# Patient Record
Sex: Female | Born: 1955
Health system: Southern US, Community
[De-identification: ages and names within clinical notes are randomized; demographics above are authoritative.]

## PROBLEM LIST (undated history)

## (undated) DIAGNOSIS — M1990 Unspecified osteoarthritis, unspecified site: Secondary | ICD-10-CM

## (undated) DIAGNOSIS — K219 Gastro-esophageal reflux disease without esophagitis: Secondary | ICD-10-CM

## (undated) DIAGNOSIS — K639 Disease of intestine, unspecified: Secondary | ICD-10-CM

## (undated) DIAGNOSIS — T753XXA Motion sickness, initial encounter: Secondary | ICD-10-CM

## (undated) DIAGNOSIS — F419 Anxiety disorder, unspecified: Secondary | ICD-10-CM

## (undated) DIAGNOSIS — E785 Hyperlipidemia, unspecified: Secondary | ICD-10-CM

## (undated) DIAGNOSIS — Z1211 Encounter for screening for malignant neoplasm of colon: Secondary | ICD-10-CM

## (undated) DIAGNOSIS — F331 Major depressive disorder, recurrent, moderate: Secondary | ICD-10-CM

## (undated) HISTORY — DX: Encounter for screening for malignant neoplasm of colon: Z12.11

## (undated) HISTORY — DX: Disease of intestine, unspecified: K63.9

## (undated) HISTORY — DX: Gastro-esophageal reflux disease without esophagitis: K21.9

## (undated) HISTORY — DX: Unspecified osteoarthritis, unspecified site: M19.90

## (undated) HISTORY — PX: COSMETIC SURGERY: SHX468

## (undated) HISTORY — PX: PARTIAL HYSTERECTOMY: SHX80

## (undated) HISTORY — PX: EYE SURGERY: SHX253

---

## 2007-12-08 ENCOUNTER — Ambulatory Visit: Payer: Self-pay | Admitting: Pain Medicine

## 2007-12-24 ENCOUNTER — Ambulatory Visit: Payer: Self-pay | Admitting: Pain Medicine

## 2008-01-05 ENCOUNTER — Ambulatory Visit: Payer: Self-pay | Admitting: Orthopaedic Surgery

## 2008-01-29 ENCOUNTER — Ambulatory Visit: Payer: Self-pay | Admitting: Unknown Physician Specialty

## 2008-05-07 DIAGNOSIS — K639 Disease of intestine, unspecified: Secondary | ICD-10-CM

## 2008-05-07 HISTORY — DX: Disease of intestine, unspecified: K63.9

## 2010-03-08 IMAGING — CR DG SHOULDER 3+V*L*
1 series · 3 of 3 positions shown · non-contrast
Comparison: none

REASON FOR EXAM: NECK PAIN, RADICULITIS
COMMENTS:

PROCEDURE:     DXR - DXR SHOULDER LEFT COMPLETE  - December 08, 2007 [DATE]
RESULT:     No fracture, dislocation or other acute bony abnormality is
identified. No lytic or blastic lesions are seen.

[Series 1: view not recorded · 0.17mm/px · 3 of 3 slices shown]
[im 1/3]
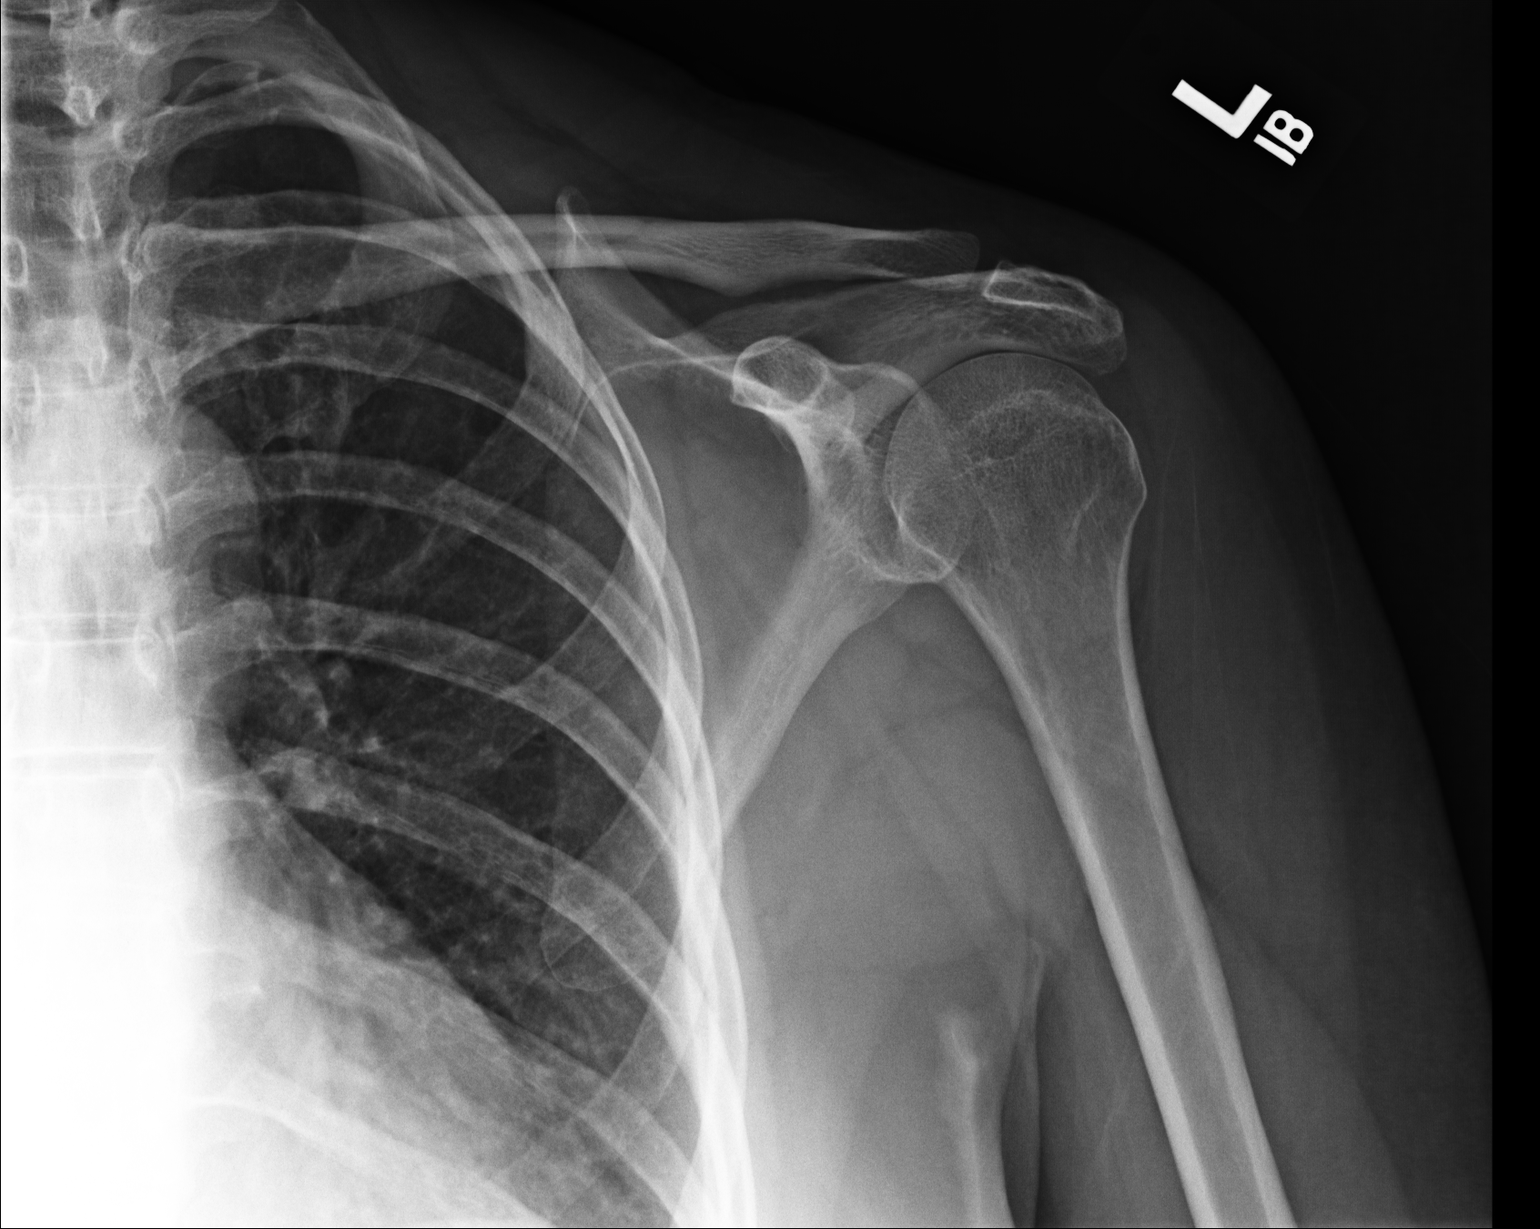
[im 2/3]
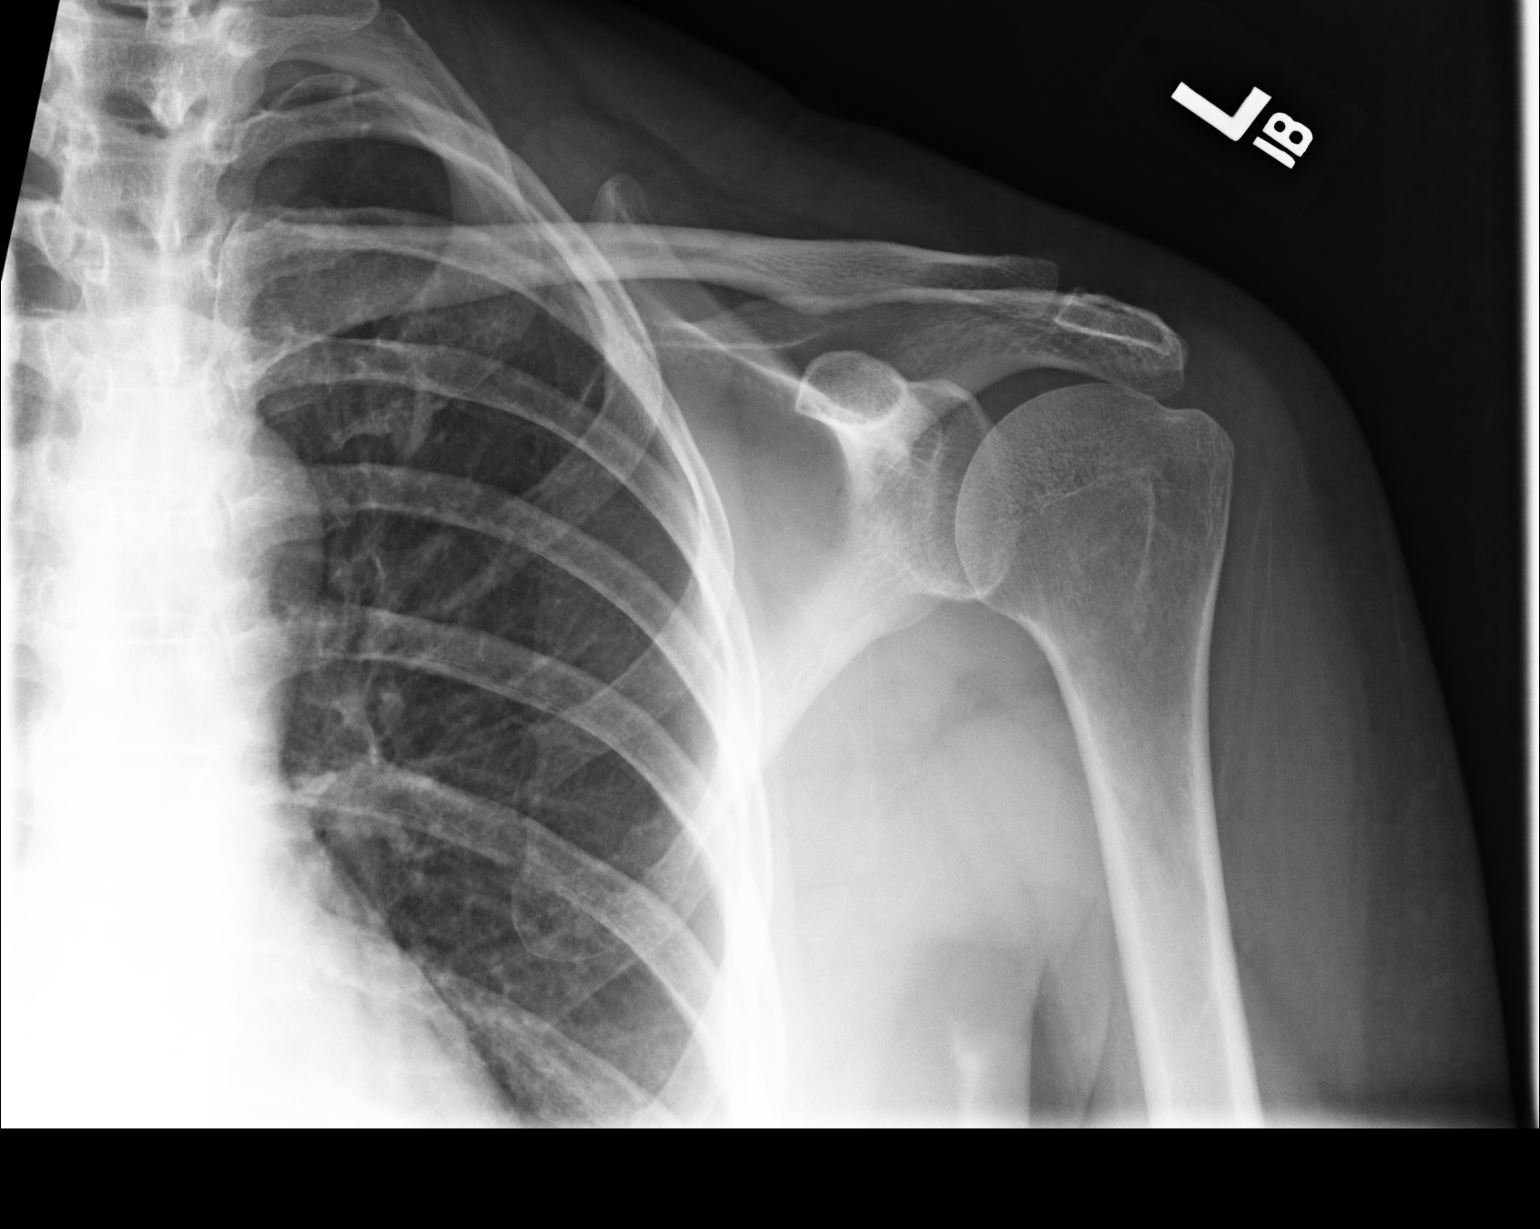
[im 3/3]
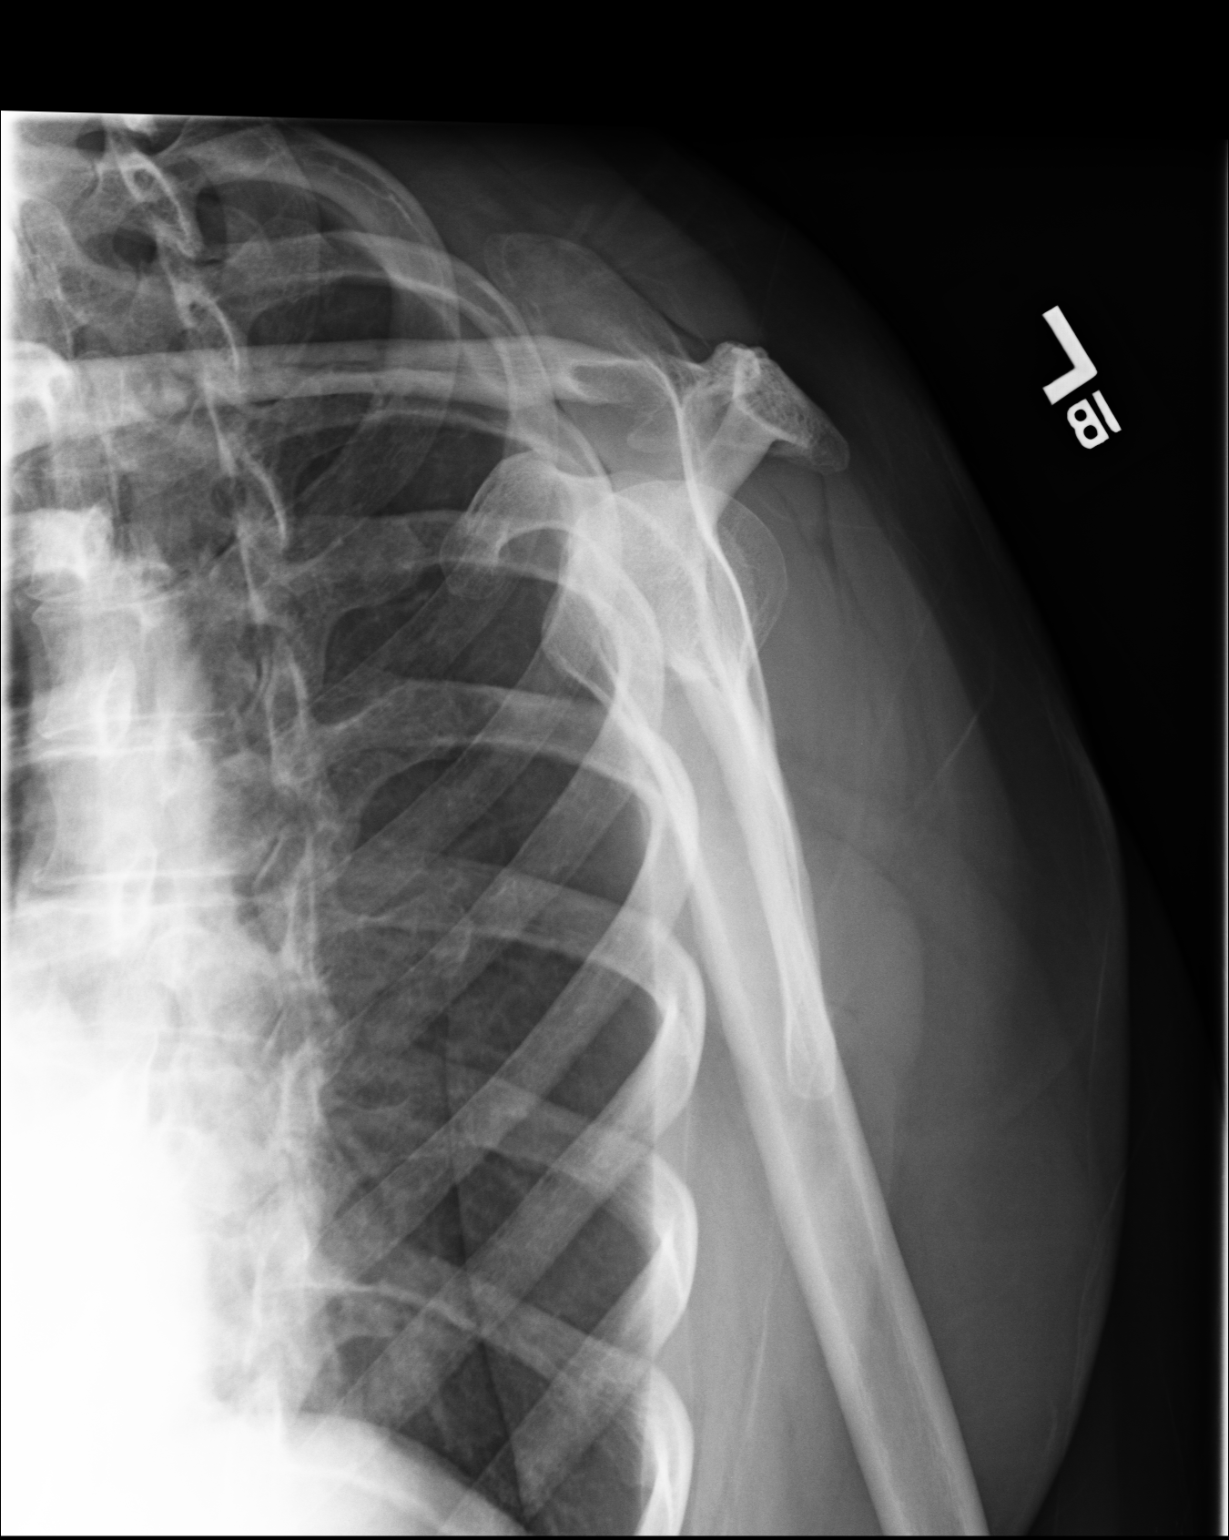

[3 of 3 positions shown; findings below may reference images not displayed]

IMPRESSION: No significant abnormalities are noted.

## 2010-03-08 IMAGING — CR CERVICAL SPINE - COMPLETE 4+ VIEW
1 series · 6 of 6 positions shown · non-contrast
Comparison: none

REASON FOR EXAM: NECK PAIN, RADICULITIS
COMMENTS:

[Series 1: view not recorded · 0.17mm/px · 6 of 6 slices shown]
[im 1/6]
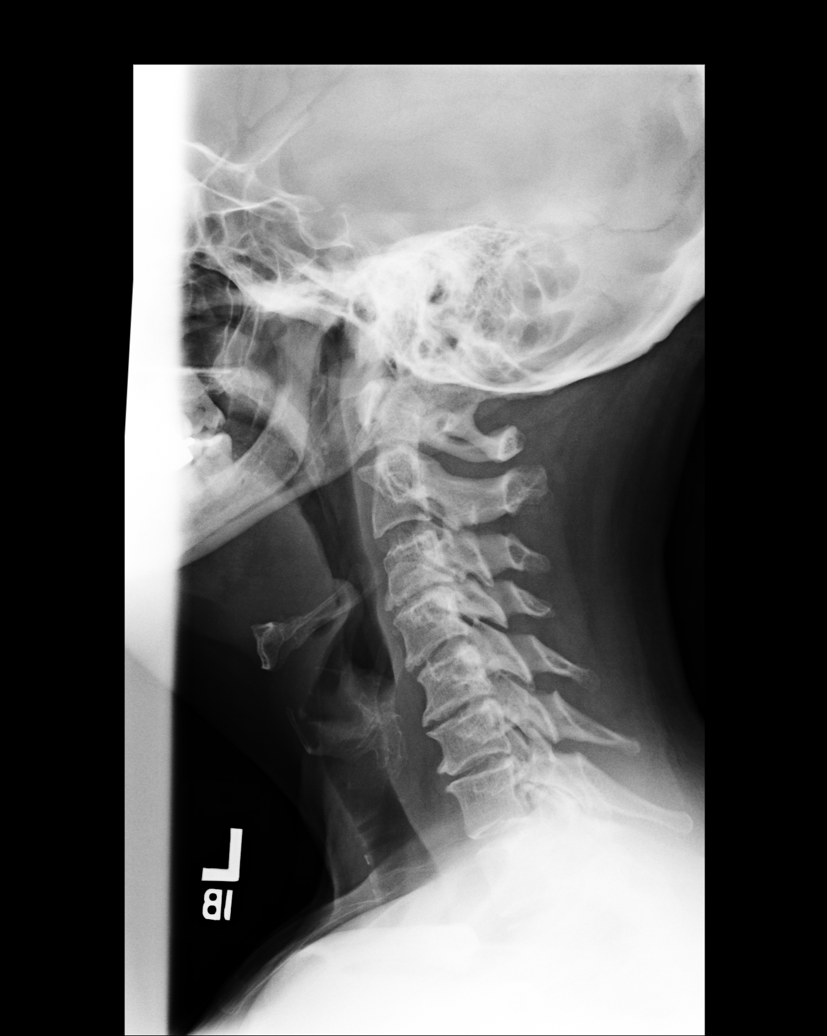
[im 2/6]
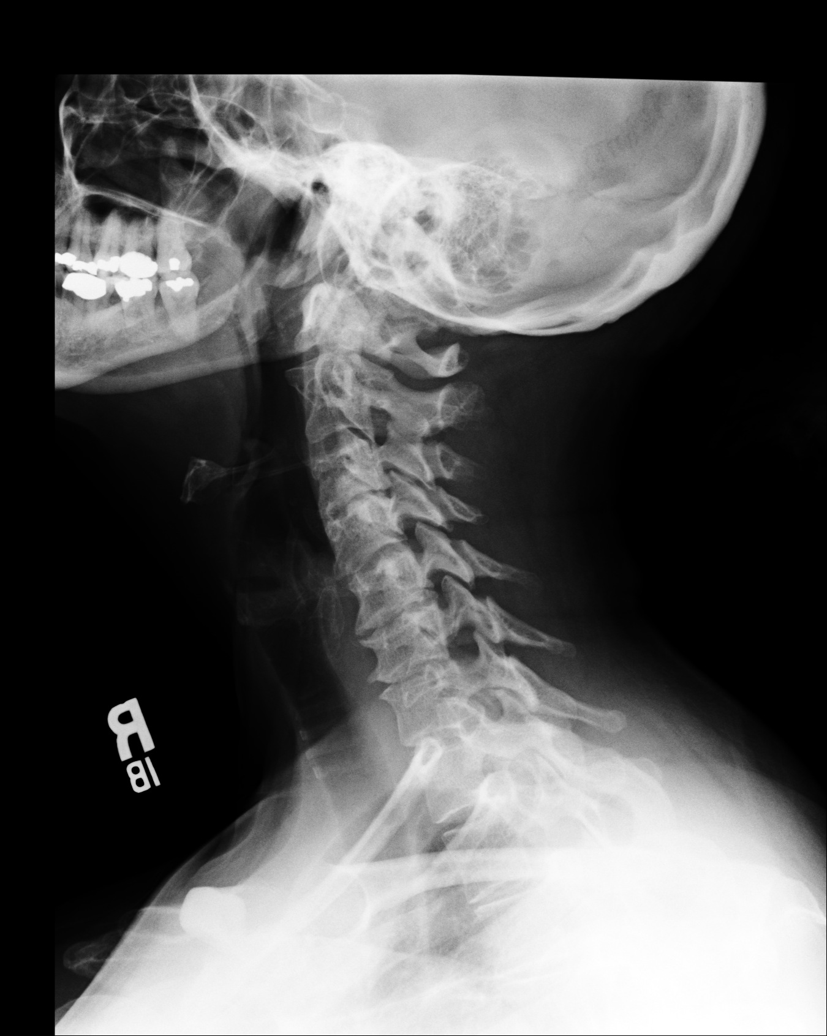
[im 3/6]
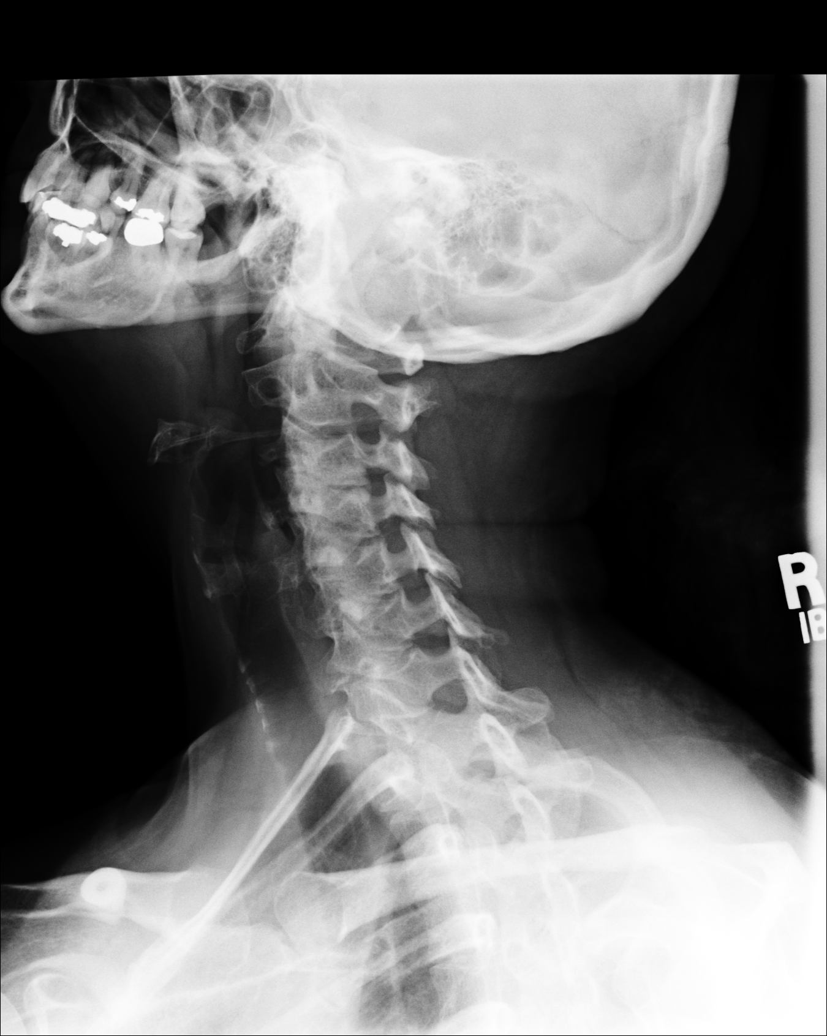
[im 4/6]
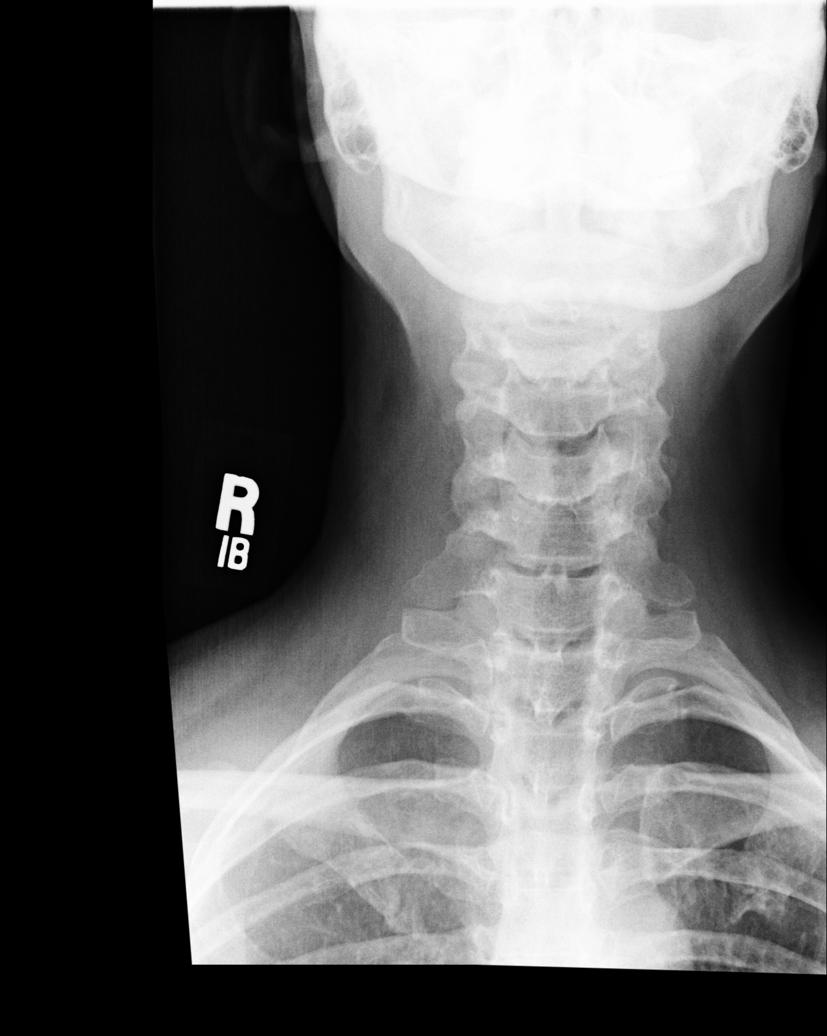
[im 5/6]
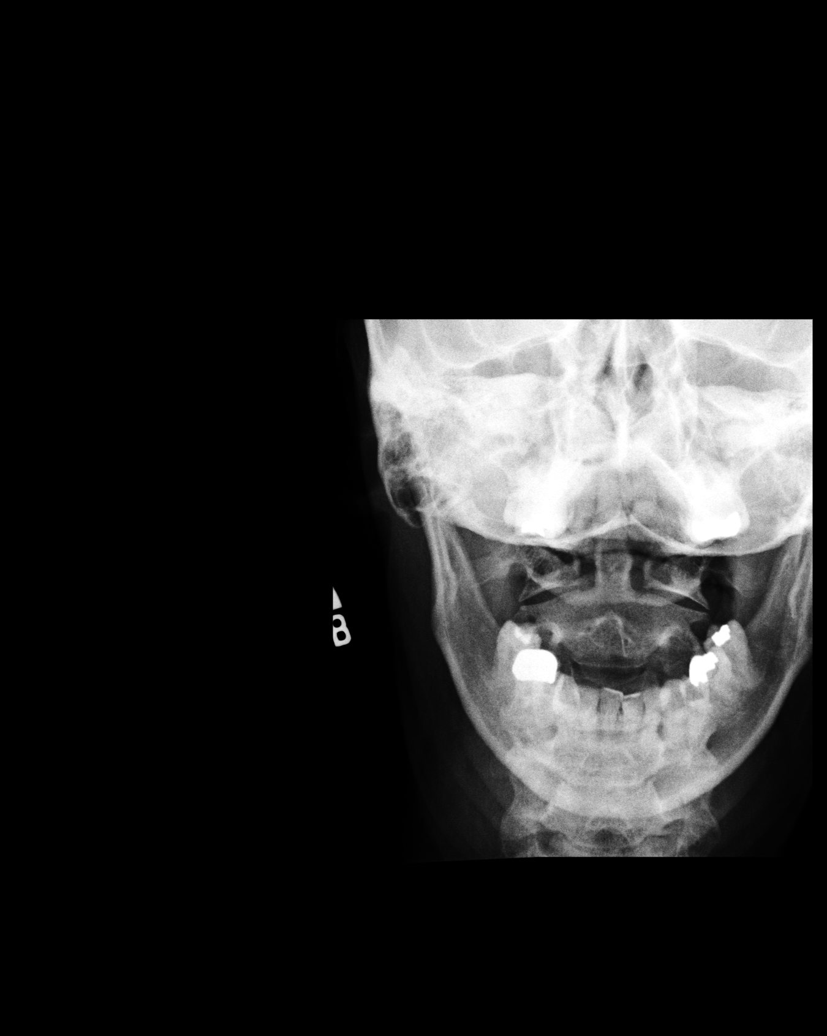
[im 6/6]
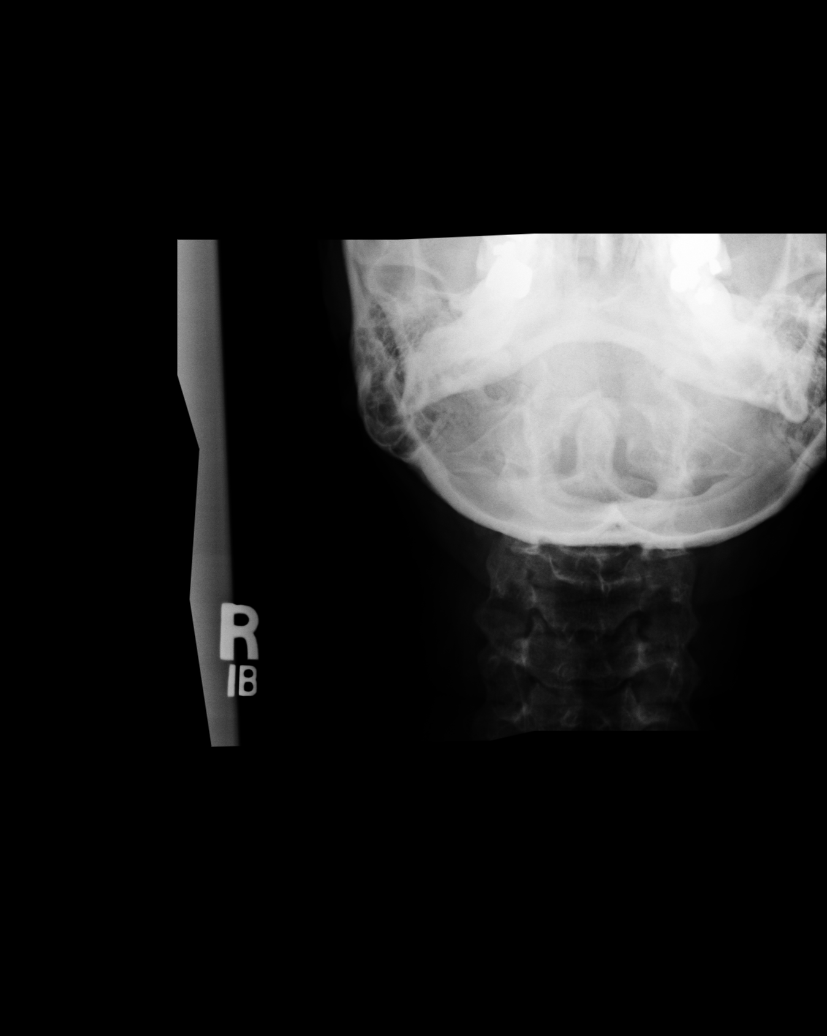

[6 of 6 positions shown; findings below may reference images not displayed]

PROCEDURE:     DXR - DXR CERVICAL SPINE COMPLETE  - December 08, 2007 [DATE]

RESULT:     The vertebral body heights are well-maintained. No fracture is
seen. There is narrowing of the C3-C4 and C5-C6 cervical disc spaces
compatible with cervical disc disease. There is slight spur impingement on
the neural foramina bilaterally at the C5-C6 level. Degenerative spurring is
noted anteriorly at multiple levels. The odontoid process is intact.
IMPRESSION: 1.     No fracture is seen.
2.     There are changes of cervical disc disease at C3-C4 and C5-C6 with
there being mild bilateral spur impingement on the neural foramina
bilaterally at the C5-C6 level.
3.     Hypertrophic spurring is present anteriorly at multiple levels.

## 2010-05-07 DIAGNOSIS — Z1211 Encounter for screening for malignant neoplasm of colon: Secondary | ICD-10-CM

## 2010-05-07 HISTORY — DX: Encounter for screening for malignant neoplasm of colon: Z12.11

## 2010-05-07 HISTORY — PX: COLONOSCOPY W/ POLYPECTOMY: SHX1380

## 2010-08-18 ENCOUNTER — Ambulatory Visit: Payer: Self-pay | Admitting: General Surgery

## 2010-08-21 LAB — PATHOLOGY REPORT

## 2011-08-31 ENCOUNTER — Ambulatory Visit: Payer: Self-pay | Admitting: Otolaryngology

## 2012-10-20 ENCOUNTER — Ambulatory Visit: Payer: Self-pay | Admitting: Family Medicine

## 2012-11-15 ENCOUNTER — Emergency Department: Payer: Self-pay | Admitting: Emergency Medicine

## 2012-11-15 LAB — CBC
HCT: 42.5 % (ref 35.0–47.0)
HGB: 14.7 g/dL (ref 12.0–16.0)
MCH: 28.5 pg (ref 26.0–34.0)
MCHC: 34.6 g/dL (ref 32.0–36.0)
MCV: 82 fL (ref 80–100)
Platelet: 182 10*3/uL (ref 150–440)
RBC: 5.16 10*6/uL (ref 3.80–5.20)
WBC: 4.7 10*3/uL (ref 3.6–11.0)

## 2012-11-15 LAB — URINALYSIS, COMPLETE
Bacteria: NONE SEEN
Blood: NEGATIVE
Glucose,UR: NEGATIVE mg/dL (ref 0–75)
Ketone: NEGATIVE
Ph: 9 (ref 4.5–8.0)
Specific Gravity: 1.004 (ref 1.003–1.030)
Squamous Epithelial: 2

## 2012-11-15 LAB — COMPREHENSIVE METABOLIC PANEL
Alkaline Phosphatase: 92 U/L (ref 50–136)
BUN: 10 mg/dL (ref 7–18)
Bilirubin,Total: 1.8 mg/dL — ABNORMAL HIGH (ref 0.2–1.0)
Creatinine: 1.02 mg/dL (ref 0.60–1.30)
EGFR (African American): >60
Glucose: 108 mg/dL — ABNORMAL HIGH (ref 65–99)
Osmolality: 285 (ref 275–301)
Potassium: 3.5 mmol/L (ref 3.5–5.1)
SGOT(AST): 16 U/L (ref 15–37)
SGPT (ALT): 18 U/L (ref 12–78)
Total Protein: 7.4 g/dL (ref 6.4–8.2)

## 2012-11-15 LAB — LIPASE, BLOOD: Lipase: 134 U/L (ref 73–393)

## 2012-11-19 ENCOUNTER — Encounter: Payer: Self-pay | Admitting: *Deleted

## 2013-05-07 HISTORY — PX: FACIAL COSMETIC SURGERY: SHX629

## 2014-01-25 ENCOUNTER — Ambulatory Visit: Payer: Self-pay | Admitting: Internal Medicine

## 2014-02-01 ENCOUNTER — Ambulatory Visit: Payer: Self-pay | Admitting: Internal Medicine

## 2014-10-24 ENCOUNTER — Other Ambulatory Visit: Payer: Self-pay | Admitting: Internal Medicine

## 2014-10-26 ENCOUNTER — Encounter: Payer: Self-pay | Admitting: Internal Medicine

## 2014-10-26 DIAGNOSIS — F331 Major depressive disorder, recurrent, moderate: Secondary | ICD-10-CM

## 2014-10-26 DIAGNOSIS — R5383 Other fatigue: Secondary | ICD-10-CM | POA: Insufficient documentation

## 2014-10-26 DIAGNOSIS — N6019 Diffuse cystic mastopathy of unspecified breast: Secondary | ICD-10-CM | POA: Insufficient documentation

## 2014-10-26 HISTORY — DX: Major depressive disorder, recurrent, moderate: F33.1

## 2015-01-14 ENCOUNTER — Other Ambulatory Visit: Payer: Self-pay | Admitting: Internal Medicine

## 2015-01-19 IMAGING — US ABDOMEN ULTRASOUND
1 series · 13 of 25 positions shown · non-contrast
Comparison: none

REASON FOR EXAM: Abd pain Bloating
COMMENTS:

[Series 1: abdomen ultrasound · 0.31mm/px · 13 of 109 slices shown]
[im 1/109]
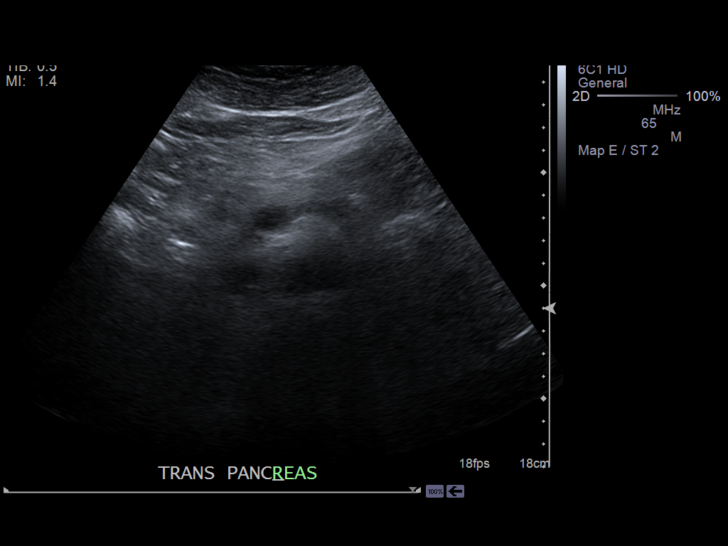
[im 10/109]
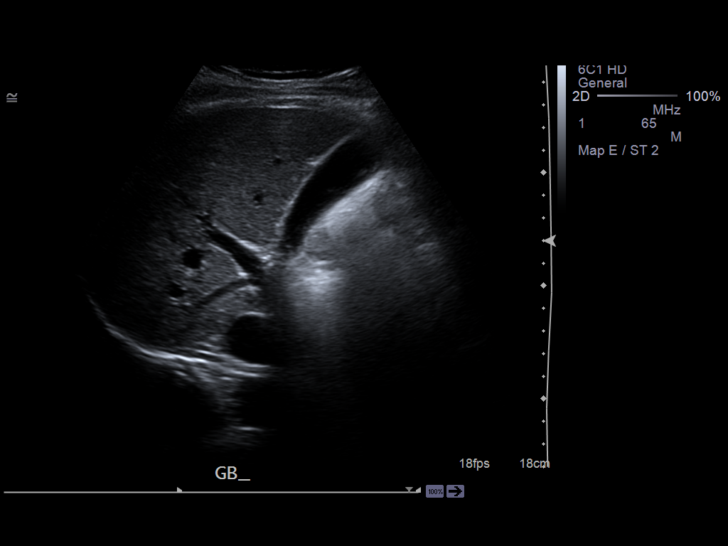
[im 19/109]
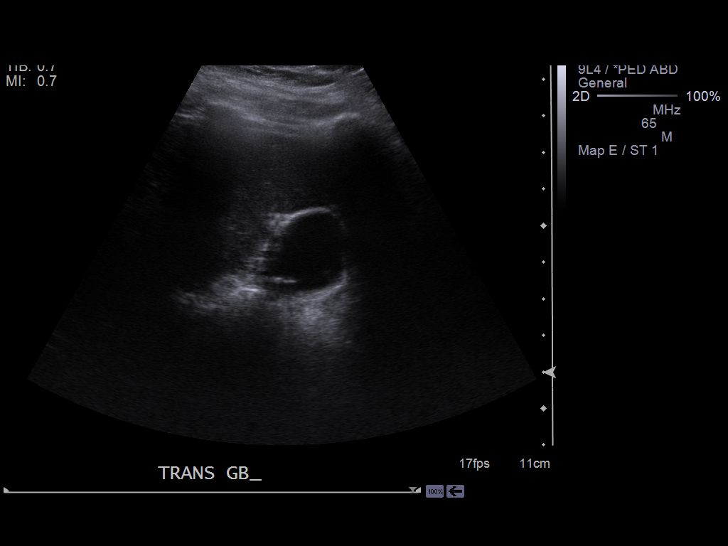
[im 28/109]
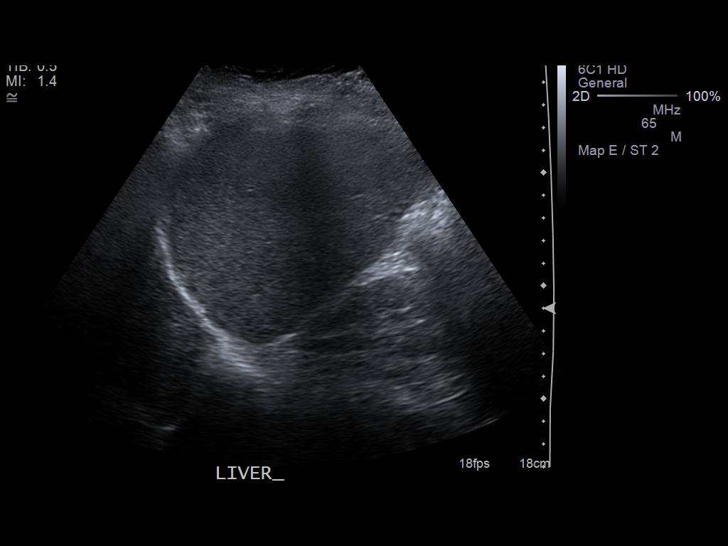
[im 37/109]
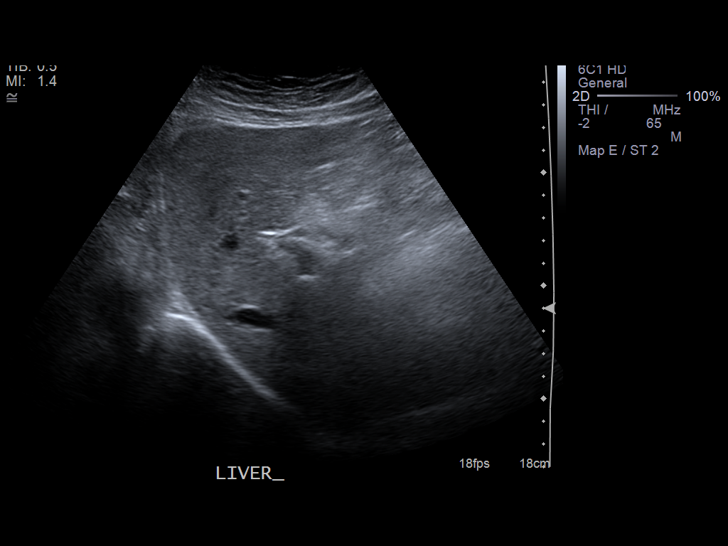
[im 46/109]
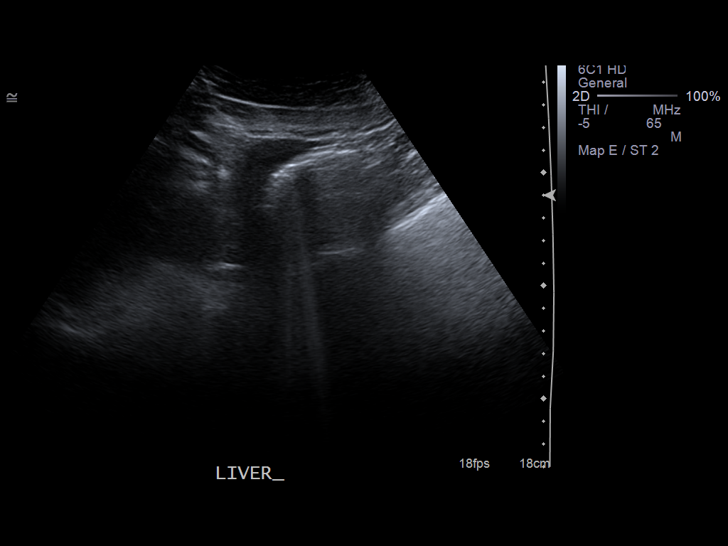
[im 55/109]
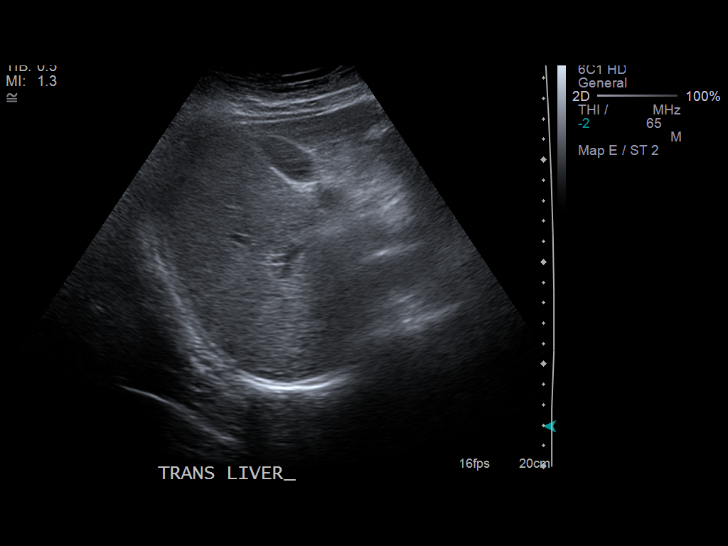
[im 64/109]
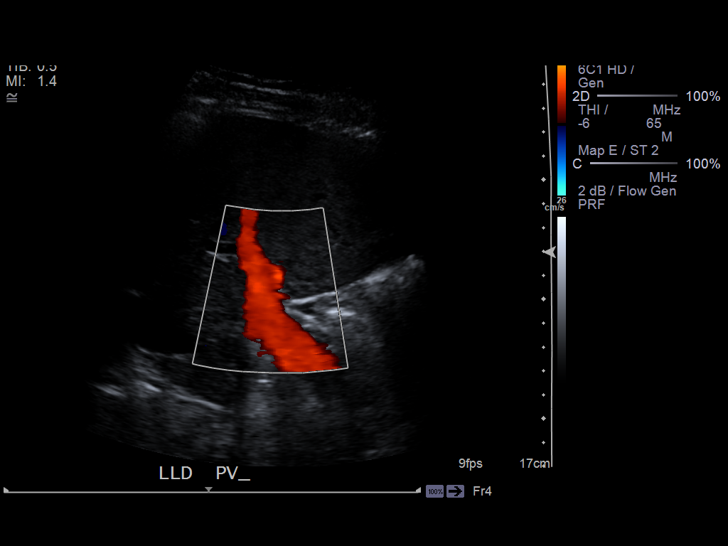
[im 73/109]
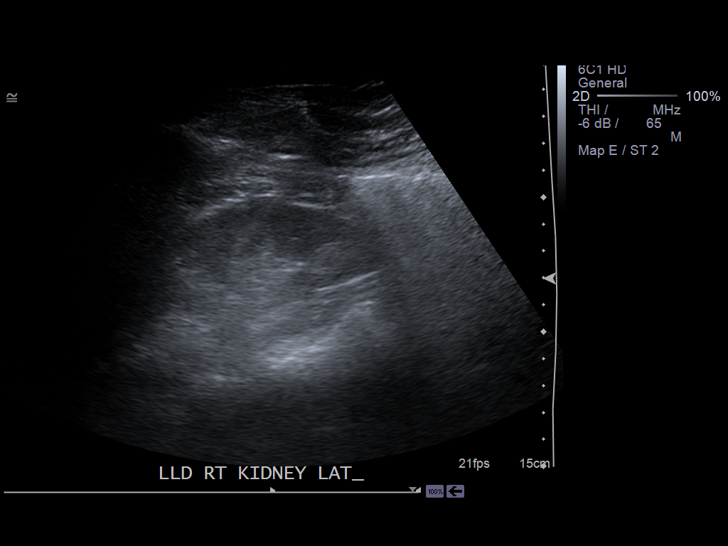
[im 82/109]
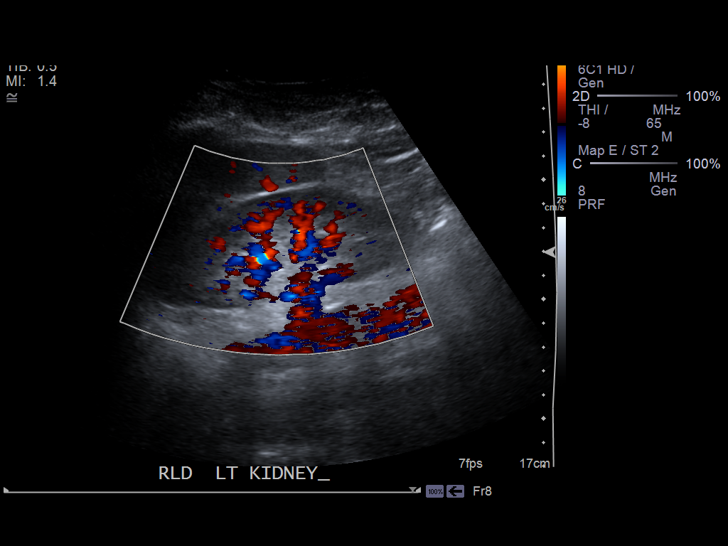
[im 91/109]
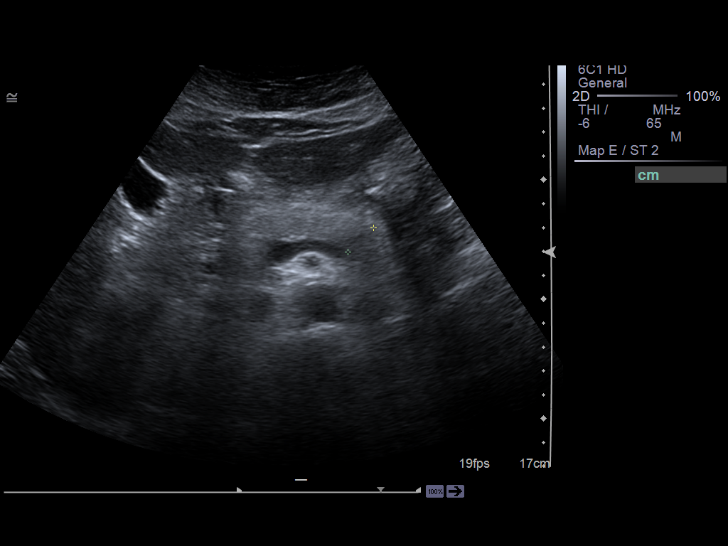
[im 100/109]
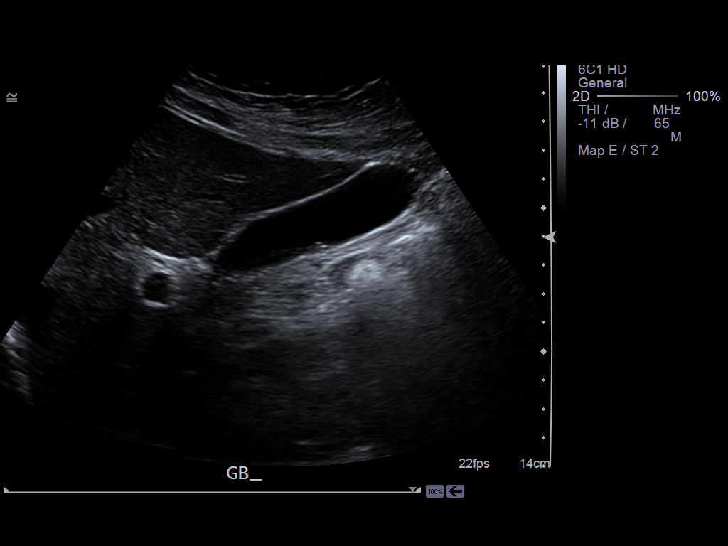
[im 109/109]
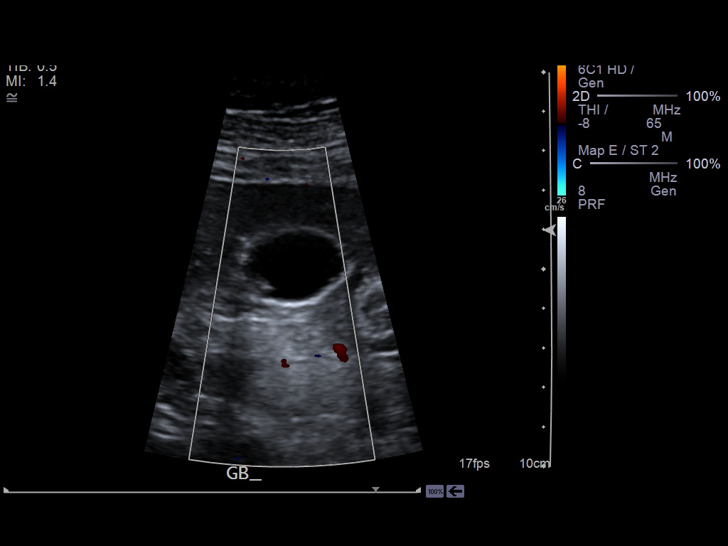

[13 of 25 positions shown; findings below may reference images not displayed]

PROCEDURE:     US  - US ABDOMEN GENERAL SURVEY  - October 20, 2012  [DATE]

RESULT:

The liver demonstrates a homogeneous echotexture. Hepatopetal flow is
identified within the portal vein. The aorta and IVC are unremarkable. The
pancreas and spleen are unremarkable. The spleen measures 11.16 cm in
longitudinal dimension. Evaluation of the gallbladder demonstrates small
echogenic foci. These foci appear to be nonmobile and differential
considerations are small adherent gallstones versus small gallbladder wall
polyps. There is no evidence of pericholecystic fluid, sonographic Murphy's
sign, gallbladder wall thickening or common bile duct dilatation. The
gallbladder wall measures 1.6 mm in thickness and the common bile duct
mm in diameter. There is no evidence of intrahepatic biliary ductal
dilatation.

The right kidney measures 9.87 x 4.97 x 4.38 cm and the left 10.7 x 4.22 x
4.18 cm. There is appropriate corticomedullary differentiation without
evidence of hydronephrosis or solid or cystic masses.
IMPRESSION: 1.  No sonographic evidence of cholecystitis.
2.  There are findings consistent with small, nonmobile gallstones versus
small gallbladder wall polyps. No further sonographic abnormality
identified. Re-evaluation of the gallbladder findings in 4 to 6 months is
recommended.

## 2015-02-06 ENCOUNTER — Encounter: Payer: Self-pay | Admitting: Internal Medicine

## 2015-02-06 DIAGNOSIS — E785 Hyperlipidemia, unspecified: Secondary | ICD-10-CM

## 2015-02-06 HISTORY — DX: Hyperlipidemia, unspecified: E78.5

## 2015-02-07 ENCOUNTER — Ambulatory Visit (INDEPENDENT_AMBULATORY_CARE_PROVIDER_SITE_OTHER): Payer: BLUE CROSS/BLUE SHIELD | Admitting: Internal Medicine

## 2015-02-07 ENCOUNTER — Encounter: Payer: Self-pay | Admitting: Internal Medicine

## 2015-02-07 VITALS — BP 136/70 | HR 64 | Ht 63.0 in | Wt 137.6 lb

## 2015-02-07 DIAGNOSIS — D126 Benign neoplasm of colon, unspecified: Secondary | ICD-10-CM | POA: Diagnosis not present

## 2015-02-07 DIAGNOSIS — Z1239 Encounter for other screening for malignant neoplasm of breast: Secondary | ICD-10-CM | POA: Diagnosis not present

## 2015-02-07 DIAGNOSIS — E785 Hyperlipidemia, unspecified: Secondary | ICD-10-CM | POA: Diagnosis not present

## 2015-02-07 DIAGNOSIS — F331 Major depressive disorder, recurrent, moderate: Secondary | ICD-10-CM | POA: Diagnosis not present

## 2015-02-07 DIAGNOSIS — Z0000 Encounter for general adult medical examination without abnormal findings: Secondary | ICD-10-CM | POA: Diagnosis not present

## 2015-02-07 MED ORDER — ESCITALOPRAM OXALATE 10 MG PO TABS: 10.0000 mg | ORAL_TABLET | Freq: Every day | ORAL | Status: DC

## 2015-02-07 NOTE — Progress Notes (Signed)
Date:  02/07/2015   Name:  Desiree Grant   DOB:  11/18/1955   MRN:  [id number]   Chief Complaint: Annual Exam Desiree Grant is a 59 y.o. female who presents today for her Complete Annual Exam. She feels fairly well. She reports exercising walking 5 miles three times per week. She reports she is sleeping fairly well. She has no breast issues. She is status post partial hysterectomy for bleeding and fibroids, her ovaries and cervix remain. Pap smear was done last year and was normal. Depression Patient is on Lexapro for depression. She takes 10 mg daily. She does note her symptoms worsen during periods of rainy weather or shorter days during the winter. She's never taken a higher dose than 10 mg. She does continue her usual activities and doesn't feel that her symptoms are significantly worse. Hyperlipidemia lipids done last year were borderline with a total cholesterol 219 and LDL of 146. HDL was 60. Her risk was low so no medications were recommended Review of Systems:  Review of Systems  Constitutional: Negative for fever, appetite change and fatigue.  HENT: Negative for hearing loss, sinus pressure, sore throat and trouble swallowing.   Eyes: Negative for visual disturbance.  Respiratory: Negative for chest tightness, shortness of breath and wheezing.   Cardiovascular: Positive for palpitations (occasional asymptomatic). Negative for chest pain and leg swelling.  Gastrointestinal: Negative for nausea, abdominal pain, diarrhea and constipation.  Genitourinary: Negative for dysuria, urgency, hematuria, vaginal bleeding and vaginal discharge.  Musculoskeletal: Negative for myalgias and gait problem.  Skin: Negative for color change and rash.  Psychiatric/Behavioral: Negative for sleep disturbance, dysphoric mood and decreased concentration.    Patient Active Problem List   Diagnosis Date Noted  . Hyperlipidemia, mild 02/06/2015  . Fatigue 10/26/2014  . Fibrocystic breast changes 10/26/2014   . Depression, major, recurrent, moderate ([place]) 10/26/2014    Prior to Admission medications   Medication Sig Start Date End Date Taking? Authorizing Provider  escitalopram (LEXAPRO) 10 MG tablet TAKE 1 TABLET BY MOUTH EVERY DAY 01/14/15  Yes Glean Hess, MD    Allergies  Allergen Reactions  . Niacin And Related Other (See Comments)    blisters    Past Surgical History  Procedure Laterality Date  . Colonoscopy w/ polypectomy  2012    tubular adenoma 0.5cm was removed from descending colon  . Partial hysterectomy      bleeding; cervix remains    Social History  Substance Use Topics  . Smoking status: Never Smoker   . Smokeless tobacco: None  . Alcohol Use: No     Medication list has been reviewed and updated.  Physical Examination:  Physical Exam  Constitutional: She is oriented to person, place, and time. She appears well-developed and well-nourished. No distress.  HENT:  Head: Normocephalic and atraumatic.  Eyes: Conjunctivae are normal. Right eye exhibits no discharge. Left eye exhibits no discharge. No scleral icterus.  Neck: Normal range of motion. Carotid bruit is not present. No thyromegaly present.  Cardiovascular: Normal rate, regular rhythm and normal heart sounds.   Pulmonary/Chest: Effort normal and breath sounds normal. No respiratory distress. Right breast exhibits no mass, no nipple discharge, no skin change and no tenderness. Left breast exhibits no mass, no nipple discharge, no skin change and no tenderness.  Abdominal: Soft. Bowel sounds are normal. There is no tenderness. There is no rebound and no guarding.  Genitourinary: Vagina normal. Pelvic exam was performed with patient prone. Cervix exhibits no motion tenderness.  Right adnexum displays no mass and no tenderness. Left adnexum displays no mass and no tenderness.  Musculoskeletal: Normal range of motion. She exhibits no edema.  Lymphadenopathy:    She has no cervical adenopathy.    She has no  axillary adenopathy.  Neurological: She is alert and oriented to person, place, and time. She has normal reflexes.  Skin: Skin is warm, dry and intact. No rash noted.  Psychiatric: She has a normal mood and affect. Her speech is normal and behavior is normal. Thought content normal. Cognition and memory are normal.  Nursing note and vitals reviewed.   BP 136/70 mmHg  Pulse 64  Ht 5\' 3"  (1.6 m)  Wt 137 lb 9.6 oz (62.415 kg)  BMI 24.38 kg/m2  Assessment and Plan: 1. Annual physical exam Normal exam with normal pelvic exam Recommend Pap smear every 3 years - Flu Vaccine QUAD 36+ mos PF IM (Fluarix & Fluzone Quad PF) - CBC with Differential/Platelet - Comprehensive metabolic panel - POCT urinalysis dipstick  2. Breast cancer screening - MM DIGITAL SCREENING BILATERAL; Future  3. Depression, major, recurrent, moderate ([place]) Doing fairly well on current dose Consider increasing to 20 mg daily as needed seasonally - escitalopram (LEXAPRO) 10 MG tablet; Take 1 tablet (10 mg total) by mouth daily.  Dispense: 30 tablet; Refill: 12 - TSH  4. Hyperlipidemia, mild Check lipid panel and advise on medication - Lipid panel  5. Adenomatous colon polyp Found in 2012 Suspect next colonoscopy due 2017  Halina Maidens, MD [facility]  02/07/2015

## 2015-02-07 NOTE — Patient Instructions (Signed)

## 2015-02-08 LAB — COMPREHENSIVE METABOLIC PANEL
A/G RATIO: 2.3 (ref 1.1–2.5)
ALBUMIN: 4.4 g/dL (ref 3.5–5.5)
ALT: 12 IU/L (ref 0–32)
AST: 13 IU/L (ref 0–40)
Alkaline Phosphatase: 50 IU/L (ref 39–117)
BUN / CREAT RATIO: 25 — AB (ref 9–23)
BUN: 17 mg/dL (ref 6–24)
Bilirubin Total: 0.7 mg/dL (ref 0.0–1.2)
CALCIUM: 9.6 mg/dL (ref 8.7–10.2)
CO2: 27 mmol/L (ref 18–29)
Chloride: 103 mmol/L (ref 97–108)
Creatinine, Ser: 0.69 mg/dL (ref 0.57–1.00)
GFR, EST AFRICAN AMERICAN: 112 mL/min/{1.73_m2} (ref >59)
GFR, EST NON AFRICAN AMERICAN: 97 mL/min/{1.73_m2} (ref >59)
GLOBULIN, TOTAL: 1.9 g/dL (ref 1.5–4.5)
Glucose: 84 mg/dL (ref 65–99)
POTASSIUM: 4.3 mmol/L (ref 3.5–5.2)
SODIUM: 144 mmol/L (ref 134–144)
TOTAL PROTEIN: 6.3 g/dL (ref 6.0–8.5)

## 2015-02-08 LAB — LIPID PANEL
CHOL/HDL RATIO: 2.7 ratio (ref 0.0–4.4)
Cholesterol, Total: 207 mg/dL — ABNORMAL HIGH (ref 100–199)
HDL: 77 mg/dL (ref >39)
LDL Calculated: 122 mg/dL — ABNORMAL HIGH (ref 0–99)
Triglycerides: 40 mg/dL (ref 0–149)
VLDL Cholesterol Cal: 8 mg/dL (ref 5–40)

## 2015-02-08 LAB — CBC WITH DIFFERENTIAL/PLATELET
BASOS: 1 %
Basophils Absolute: 0 10*3/uL (ref 0.0–0.2)
EOS (ABSOLUTE): 0.1 10*3/uL (ref 0.0–0.4)
EOS: 2 %
HEMATOCRIT: 42.8 % (ref 34.0–46.6)
HEMOGLOBIN: 14 g/dL (ref 11.1–15.9)
IMMATURE GRANS (ABS): 0 10*3/uL (ref 0.0–0.1)
IMMATURE GRANULOCYTES: 0 %
LYMPHS: 34 %
Lymphocytes Absolute: 1.6 10*3/uL (ref 0.7–3.1)
MCH: 28.2 pg (ref 26.6–33.0)
MCHC: 32.7 g/dL (ref 31.5–35.7)
MCV: 86 fL (ref 79–97)
Monocytes Absolute: 0.3 10*3/uL (ref 0.1–0.9)
Monocytes: 6 %
NEUTROS ABS: 2.6 10*3/uL (ref 1.4–7.0)
NEUTROS PCT: 57 %
Platelets: 170 10*3/uL (ref 150–379)
RBC: 4.97 x10E6/uL (ref 3.77–5.28)
RDW: 13.3 % (ref 12.3–15.4)
WBC: 4.6 10*3/uL (ref 3.4–10.8)

## 2015-02-08 LAB — TSH: TSH: 0.949 u[IU]/mL (ref 0.450–4.500)

## 2015-02-22 ENCOUNTER — Other Ambulatory Visit: Payer: Self-pay | Admitting: Internal Medicine

## 2015-02-22 DIAGNOSIS — F331 Major depressive disorder, recurrent, moderate: Secondary | ICD-10-CM

## 2015-02-22 MED ORDER — ESCITALOPRAM OXALATE 20 MG PO TABS: 20.0000 mg | ORAL_TABLET | Freq: Every day | ORAL | Status: DC

## 2015-03-11 ENCOUNTER — Telehealth: Payer: Self-pay

## 2015-03-11 NOTE — Telephone Encounter (Signed)
Patient wanted to discuss Anxiety meds and asked to be changed to new Rx stating Lexapro was not working. Per Dr. Army Melia I offered her an appointment and she refused. I did tell her that until being seen she would have to stay on Lexapro and she agreed she can not be seen and stated she will find someone who can and hung up.

## 2015-03-30 ENCOUNTER — Ambulatory Visit (INDEPENDENT_AMBULATORY_CARE_PROVIDER_SITE_OTHER): Payer: BLUE CROSS/BLUE SHIELD | Admitting: Internal Medicine

## 2015-03-30 ENCOUNTER — Encounter: Payer: Self-pay | Admitting: Internal Medicine

## 2015-03-30 VITALS — BP 120/60 | HR 80 | Ht 63.0 in | Wt 133.8 lb

## 2015-03-30 DIAGNOSIS — F419 Anxiety disorder, unspecified: Secondary | ICD-10-CM | POA: Diagnosis not present

## 2015-03-30 DIAGNOSIS — F331 Major depressive disorder, recurrent, moderate: Secondary | ICD-10-CM

## 2015-03-30 MED ORDER — ESCITALOPRAM OXALATE 10 MG PO TABS: 10.0000 mg | ORAL_TABLET | Freq: Every day | ORAL | Status: DC

## 2015-03-30 MED ORDER — BUPROPION HCL ER (XL) 150 MG PO TB24: 150.0000 mg | ORAL_TABLET | Freq: Every day | ORAL | Status: DC

## 2015-03-30 NOTE — Progress Notes (Signed)
Date:  03/30/2015   Name:  Desiree Grant   DOB:  1955-12-01   MRN:  [id number]   Chief Complaint: Anxiety  Patient complains of increased depressive symptoms along with anxiety and irritability. 6 weeks ago she was having some increase in symptoms have increased her Lexapro to 20 mg. Now she thinks that her symptoms may be slightly worse, possibly related to the increased dose. Most of her stress is related to work, employees and family. She's not sleeping well but is not feeling suicidal. She feels like she needs additional or different medication.  Review of Systems  Constitutional: Positive for fatigue. Negative for fever, chills and unexpected weight change.  Respiratory: Negative for chest tightness and shortness of breath.   Cardiovascular: Negative for chest pain and palpitations.  Psychiatric/Behavioral: Positive for sleep disturbance, dysphoric mood and agitation. Negative for behavioral problems and self-injury. The patient is nervous/anxious.     Patient Active Problem List   Diagnosis Date Noted  . Adenomatous colon polyp 02/07/2015  . Hyperlipidemia, mild 02/06/2015  . Fatigue 10/26/2014  . Fibrocystic breast changes 10/26/2014  . Depression, major, recurrent, moderate ([place]) 10/26/2014    Prior to Admission medications   Medication Sig Start Date End Date Taking? Authorizing Provider  escitalopram (LEXAPRO) 20 MG tablet Take 1 tablet (20 mg total) by mouth daily. 02/22/15  Yes Glean Hess, MD    Allergies  Allergen Reactions  . Niacin And Related Other (See Comments)    blisters    Past Surgical History  Procedure Laterality Date  . Colonoscopy w/ polypectomy  2012    tubular adenoma 0.5cm was removed from descending colon  . Partial hysterectomy      bleeding; cervix remains    Social History  Substance Use Topics  . Smoking status: Never Smoker   . Smokeless tobacco: None  . Alcohol Use: No    Medication list has been reviewed and  updated.   Physical Exam  Constitutional: She is oriented to person, place, and time. She appears well-developed.  Neck: Normal range of motion. Neck supple. No thyromegaly present.  Cardiovascular: Normal rate, regular rhythm and normal heart sounds.   Pulmonary/Chest: Effort normal and breath sounds normal.  Musculoskeletal: She exhibits no edema.  Neurological: She is alert and oriented to person, place, and time.  Psychiatric: Her speech is normal. Her mood appears anxious. Cognition and memory are normal. She exhibits a depressed mood.    BP 120/60 mmHg  Pulse 80  Ht 5\' 3"  (1.6 m)  Wt 133 lb 12.8 oz (60.691 kg)  BMI 23.71 kg/m2  Assessment and Plan: 1. Depression, major, recurrent, moderate (HCC) Reduce Lexapro to 10 mg And Wellbutrin 150 mg Follow-up if symptoms are not improving after 4 weeks - buPROPion (WELLBUTRIN XL) 150 MG 24 hr tablet; Take 1 tablet (150 mg total) by mouth daily.  Dispense: 30 tablet; Refill: 3 - escitalopram (LEXAPRO) 10 MG tablet; Take 1 tablet (10 mg total) by mouth daily.  Dispense: 30 tablet; Refill: 5  2. Acute anxiety Should improve with addition of bupropion May need to consider short acting prn medication   Halina Maidens, MD [facility]  03/30/2015

## 2015-08-08 ENCOUNTER — Ambulatory Visit: Payer: Self-pay | Admitting: General Surgery

## 2015-11-14 ENCOUNTER — Other Ambulatory Visit: Payer: Self-pay | Admitting: Internal Medicine

## 2015-11-14 ENCOUNTER — Telehealth: Payer: Self-pay

## 2015-11-14 DIAGNOSIS — F331 Major depressive disorder, recurrent, moderate: Secondary | ICD-10-CM

## 2015-11-14 MED ORDER — ESCITALOPRAM OXALATE 10 MG PO TABS: 10.0000 mg | ORAL_TABLET | Freq: Every day | ORAL | Status: DC

## 2015-11-14 MED ORDER — BUPROPION HCL ER (XL) 150 MG PO TB24: 150.0000 mg | ORAL_TABLET | Freq: Every day | ORAL | Status: DC

## 2015-11-14 NOTE — Telephone Encounter (Signed)
Patient wants refills on meds.

## 2015-12-02 ENCOUNTER — Other Ambulatory Visit: Payer: Self-pay | Admitting: Internal Medicine

## 2015-12-02 DIAGNOSIS — F331 Major depressive disorder, recurrent, moderate: Secondary | ICD-10-CM

## 2016-02-08 ENCOUNTER — Ambulatory Visit (INDEPENDENT_AMBULATORY_CARE_PROVIDER_SITE_OTHER): Payer: BLUE CROSS/BLUE SHIELD | Admitting: Internal Medicine

## 2016-02-08 ENCOUNTER — Ambulatory Visit
Admission: RE | Admit: 2016-02-08 | Discharge: 2016-02-08 | Disposition: A | Discharge status: Home / Self Care | Payer: BLUE CROSS/BLUE SHIELD | Source: Ambulatory Visit | Attending: Internal Medicine | Admitting: Internal Medicine

## 2016-02-08 ENCOUNTER — Encounter: Payer: Self-pay | Admitting: Internal Medicine

## 2016-02-08 VITALS — BP 120/80 | HR 74 | Resp 16 | Ht 63.0 in | Wt 140.0 lb

## 2016-02-08 DIAGNOSIS — F331 Major depressive disorder, recurrent, moderate: Secondary | ICD-10-CM

## 2016-02-08 DIAGNOSIS — M25551 Pain in right hip: Secondary | ICD-10-CM | POA: Insufficient documentation

## 2016-02-08 DIAGNOSIS — E785 Hyperlipidemia, unspecified: Secondary | ICD-10-CM | POA: Diagnosis not present

## 2016-02-08 DIAGNOSIS — Z1159 Encounter for screening for other viral diseases: Secondary | ICD-10-CM | POA: Diagnosis not present

## 2016-02-08 DIAGNOSIS — Z1231 Encounter for screening mammogram for malignant neoplasm of breast: Secondary | ICD-10-CM

## 2016-02-08 DIAGNOSIS — Z0000 Encounter for general adult medical examination without abnormal findings: Secondary | ICD-10-CM

## 2016-02-08 DIAGNOSIS — M5442 Lumbago with sciatica, left side: Principal | ICD-10-CM

## 2016-02-08 DIAGNOSIS — G8929 Other chronic pain: Secondary | ICD-10-CM | POA: Diagnosis not present

## 2016-02-08 DIAGNOSIS — Z23 Encounter for immunization: Secondary | ICD-10-CM | POA: Diagnosis not present

## 2016-02-08 DIAGNOSIS — M25552 Pain in left hip: Secondary | ICD-10-CM | POA: Insufficient documentation

## 2016-02-08 DIAGNOSIS — Z1239 Encounter for other screening for malignant neoplasm of breast: Secondary | ICD-10-CM

## 2016-02-08 LAB — POCT URINALYSIS DIPSTICK
Bilirubin, UA: NEGATIVE
GLUCOSE UA: NEGATIVE
Ketones, UA: NEGATIVE
Leukocytes, UA: NEGATIVE
NITRITE UA: NEGATIVE
PROTEIN UA: NEGATIVE
RBC UA: NEGATIVE
SPEC GRAV UA: 1.01
UROBILINOGEN UA: 0.2
pH, UA: 6

## 2016-02-08 MED ORDER — ESCITALOPRAM OXALATE 20 MG PO TABS: 20.0000 mg | ORAL_TABLET | Freq: Every day | ORAL | 5 refills | Status: DC

## 2016-02-08 MED ORDER — METHYLPREDNISOLONE 4 MG PO TBPK: ORAL_TABLET | ORAL | 0 refills | Status: DC

## 2016-02-08 MED ORDER — CYCLOBENZAPRINE HCL 10 MG PO TABS: 10.0000 mg | ORAL_TABLET | Freq: Every day | ORAL | 2 refills | Status: DC

## 2016-02-08 NOTE — Patient Instructions (Signed)
**After Prednisone taper is complete, begin Aleve 220 mg 2 tablets twice a day with food   Breast Self-Awareness Practicing breast self-awareness may pick up problems early, prevent significant medical complications, and possibly save your life. By practicing breast self-awareness, you can become familiar with how your breasts look and feel and if your breasts are changing. This allows you to notice changes early. It can also offer you some reassurance that your breast health is good. One way to learn what is normal for your breasts and whether your breasts are changing is to do a breast self-exam. If you find a lump or something that was not present in the past, it is best to contact your caregiver right away. Other findings that should be evaluated by your caregiver include nipple discharge, especially if it is bloody; skin changes or reddening; areas where the skin seems to be pulled in (retracted); or new lumps and bumps. Breast pain is seldom associated with cancer (malignancy), but should also be evaluated by a caregiver. HOW TO PERFORM A BREAST SELF-EXAM The best time to examine your breasts is 5-7 days after your menstrual period is over. During menstruation, the breasts are lumpier, and it may be more difficult to pick up changes. If you do not menstruate, have reached menopause, or had your uterus removed (hysterectomy), you should examine your breasts at regular intervals, such as monthly. If you are breastfeeding, examine your breasts after a feeding or after using a breast pump. Breast implants do not decrease the risk for lumps or tumors, so continue to perform breast self-exams as recommended. Talk to your caregiver about how to determine the difference between the implant and breast tissue. Also, talk about the amount of pressure you should use during the exam. Over time, you will become more familiar with the variations of your breasts and more comfortable with the exam. A breast self-exam  requires you to remove all your clothes above the waist. 1. Look at your breasts and nipples. Stand in front of a mirror in a room with good lighting. With your hands on your hips, push your hands firmly downward. Look for a difference in shape, contour, and size from one breast to the other (asymmetry). Asymmetry includes puckers, dips, or bumps. Also, look for skin changes, such as reddened or scaly areas on the breasts. Look for nipple changes, such as discharge, dimpling, repositioning, or redness. 2. Carefully feel your breasts. This is best done either in the shower or tub while using soapy water or when flat on your back. Place the arm (on the side of the breast you are examining) above your head. Use the pads (not the fingertips) of your three middle fingers on your opposite hand to feel your breasts. Start in the underarm area and use  inch (2 cm) overlapping circles to feel your breast. Use 3 different levels of pressure (light, medium, and firm pressure) at each circle before moving to the next circle. The light pressure is needed to feel the tissue closest to the skin. The medium pressure will help to feel breast tissue a little deeper, while the firm pressure is needed to feel the tissue close to the ribs. Continue the overlapping circles, moving downward over the breast until you feel your ribs below your breast. Then, move one finger-width towards the center of the body. Continue to use the  inch (2 cm) overlapping circles to feel your breast as you move slowly up toward the collar bone (clavicle) near the  base of the neck. Continue the up and down exam using all 3 pressures until you reach the middle of the chest. Do this with each breast, carefully feeling for lumps or changes. 3.  Keep a written record with breast changes or normal findings for each breast. By writing this information down, you do not need to depend only on memory for size, tenderness, or location. Write down where you are in  your menstrual cycle, if you are still menstruating. Breast tissue can have some lumps or thick tissue. However, see your caregiver if you find anything that concerns you.  SEEK MEDICAL CARE IF:  You see a change in shape, contour, or size of your breasts or nipples.   You see skin changes, such as reddened or scaly areas on the breasts or nipples.   You have an unusual discharge from your nipples.   You feel a new lump or unusually thick areas.    This information is not intended to replace advice given to you by your health care provider. Make sure you discuss any questions you have with your health care provider.   Document Released: 04/23/2005 Document Revised: 04/09/2012 Document Reviewed: 08/08/2011 Elsevier Interactive Patient Education [employer].

## 2016-02-08 NOTE — Progress Notes (Signed)
Date:  02/08/2016   Name:  Desiree Grant   DOB:  December 16, 1955   MRN:  [id number]   Chief Complaint: Annual Exam and Anxiety (Is about to hav an increase in anxiety and wants to get meds increased. ) Desiree Grant is a 60 y.o. female who presents today for her Complete Annual Exam. She feels anxious and has worsening back and hip pain. She reports exercising walking 4 miles per day. She reports she is sleeping fairly well.   Anxiety  Presents for follow-up visit. Symptoms include excessive worry, irritability and nervous/anxious behavior. Patient reports no chest pain, dizziness, palpitations or shortness of breath.    Back Pain  This is a new problem. The current episode started more than 1 month ago. The problem occurs daily. The pain is present in the lumbar spine. The pain radiates to the left thigh. Pertinent negatives include no abdominal pain, chest pain, dysuria, fever, headaches or numbness.  She is very stiff in the AM or if she sits for any period of time.  If she shifts her weight from side to side while standing she has left buttock and hip pain.  She has very little pain when walking.  Chiropractic care has not helped.  She has taken an occasional tylenol only.    Review of Systems  Constitutional: Positive for irritability. Negative for chills, fatigue and fever.  HENT: Negative for congestion, hearing loss, tinnitus, trouble swallowing and voice change.   Eyes: Negative for visual disturbance.  Respiratory: Negative for cough, chest tightness, shortness of breath and wheezing.   Cardiovascular: Negative for chest pain, palpitations and leg swelling.  Gastrointestinal: Negative for abdominal pain, constipation, diarrhea and vomiting.  Endocrine: Negative for polydipsia and polyuria.  Genitourinary: Negative for difficulty urinating, dysuria, frequency, genital sores, vaginal bleeding, vaginal discharge and vaginal pain.       Odor noted  Musculoskeletal: Positive for  arthralgias and back pain. Negative for gait problem and joint swelling.  Skin: Negative for color change and rash.  Neurological: Negative for dizziness, tremors, light-headedness, numbness and headaches.  Hematological: Negative for adenopathy. Does not bruise/bleed easily.  Psychiatric/Behavioral: Positive for dysphoric mood. Negative for sleep disturbance. The patient is nervous/anxious.     Patient Active Problem List   Diagnosis Date Noted  . Adenomatous colon polyp 02/07/2015  . Hyperlipidemia, mild 02/06/2015  . Fatigue 10/26/2014  . Fibrocystic breast changes 10/26/2014  . Depression, major, recurrent, moderate ([place]) 10/26/2014    Prior to Admission medications   Medication Sig Start Date End Date Taking? Authorizing Provider  escitalopram (LEXAPRO) 10 MG tablet Take 1 tablet (10 mg total) by mouth daily. 11/14/15  Yes Glean Hess, MD    Allergies  Allergen Reactions  . Niacin And Related Other (See Comments)    blisters    Past Surgical History:  Procedure Laterality Date  . COLONOSCOPY W/ POLYPECTOMY  2012   tubular adenoma 0.5cm was removed from descending colon  . PARTIAL HYSTERECTOMY     bleeding; cervix remains    Social History  Substance Use Topics  . Smoking status: Never Smoker  . Smokeless tobacco: Never Used  . Alcohol use No     Medication list has been reviewed and updated.   Physical Exam  Constitutional: She is oriented to person, place, and time. She appears well-developed and well-nourished. No distress.  HENT:  Head: Normocephalic and atraumatic.  Right Ear: Tympanic membrane and ear canal normal.  Left Ear: Tympanic membrane  and ear canal normal.  Nose: Right sinus exhibits no maxillary sinus tenderness. Left sinus exhibits no maxillary sinus tenderness.  Mouth/Throat: Uvula is midline and oropharynx is clear and moist.  Eyes: Conjunctivae and EOM are normal. Right eye exhibits no discharge. Left eye exhibits no discharge. No  scleral icterus.  Neck: Normal range of motion. Carotid bruit is not present. No erythema present. No thyromegaly present.  Cardiovascular: Normal rate, regular rhythm, normal heart sounds and normal pulses.   Pulmonary/Chest: Effort normal. No respiratory distress. She has no wheezes. Right breast exhibits no mass, no nipple discharge, no skin change and no tenderness. Left breast exhibits no mass, no nipple discharge, no skin change and no tenderness.  Abdominal: Soft. Bowel sounds are normal. There is no hepatosplenomegaly. There is no tenderness. There is no CVA tenderness.  Musculoskeletal: She exhibits no edema or deformity.       Right hip: She exhibits normal range of motion, normal strength and no tenderness.       Left hip: She exhibits decreased range of motion. She exhibits normal strength and no tenderness.       Lumbar back: She exhibits decreased range of motion and tenderness (lateral hip bursa).  Tender of both SI joints Pain over anterior hip bilaterally SLR positive bilaterally when supine  Lymphadenopathy:    She has no cervical adenopathy.    She has no axillary adenopathy.  Neurological: She is alert and oriented to person, place, and time. She has normal reflexes. No cranial nerve deficit or sensory deficit.  Skin: Skin is warm, dry and intact. No rash noted.  Psychiatric: Her speech is normal and behavior is normal. Thought content normal. Her mood appears anxious. She exhibits a depressed mood.  Nursing note and vitals reviewed.   Resp 16   Ht 5\' 3"  (1.6 m)   Wt 140 lb (63.5 kg)   BMI 24.80 kg/m   Assessment and Plan: 1. Annual physical exam Up to date Pap next year Colonoscopy next year - CBC with Differential/Platelet - Comprehensive metabolic panel - POCT urinalysis dipstick  2. Depression, major, recurrent, moderate ([facility]) Increase stress at work - will try higher dose lexapro to begin after steroid taper - TSH - escitalopram (LEXAPRO) 20 MG tablet;  Take 1 tablet (20 mg total) by mouth daily.  Dispense: 30 tablet; Refill: 5  3. Hyperlipidemia, mild Continue exercise and healthy diet - Lipid panel  4. Chronic bilateral low back pain with left-sided sciatica May continue chiropractic care if helpful - DG Lumbar Spine Complete; Future - methylPREDNISolone (MEDROL DOSEPAK) 4 MG TBPK tablet; Take 6 pills on day 1 the 5 pills day 2 then 4 pills day 3 then 3 pills day 4 then 2 pills day 5 then one pills day 6 then stop  Dispense: 21 tablet; Refill: 0 - cyclobenzaprine (FLEXERIL) 10 MG tablet; Take 1 tablet (10 mg total) by mouth at bedtime.  Dispense: 30 tablet; Refill: 2  5. Pain of both hip joints - DG HIPS BILAT WITH PELVIS 2V; Future  6. Need for influenza vaccination - Flu Vaccine QUAD 36+ mos IM  7. Encounter for screening mammogram for breast cancer - MM DIGITAL SCREENING BILATERAL; Future  8. Need for hepatitis C screening test - Hepatitis C antibody   Halina Maidens, MD [facility]  02/08/2016

## 2016-02-09 LAB — CBC WITH DIFFERENTIAL/PLATELET
BASOS: 1 %
Basophils Absolute: 0.1 10*3/uL (ref 0.0–0.2)
EOS (ABSOLUTE): 0.1 10*3/uL (ref 0.0–0.4)
EOS: 1 %
HEMATOCRIT: 42.9 % (ref 34.0–46.6)
Hemoglobin: 14.4 g/dL (ref 11.1–15.9)
Immature Grans (Abs): 0 10*3/uL (ref 0.0–0.1)
Immature Granulocytes: 1 %
LYMPHS ABS: 1.5 10*3/uL (ref 0.7–3.1)
Lymphs: 24 %
MCH: 28.3 pg (ref 26.6–33.0)
MCHC: 33.6 g/dL (ref 31.5–35.7)
MCV: 84 fL (ref 79–97)
MONOS ABS: 0.4 10*3/uL (ref 0.1–0.9)
Monocytes: 6 %
NEUTROS ABS: 4.3 10*3/uL (ref 1.4–7.0)
NEUTROS PCT: 67 %
Platelets: 177 10*3/uL (ref 150–379)
RBC: 5.09 x10E6/uL (ref 3.77–5.28)
RDW: 12.9 % (ref 12.3–15.4)
WBC: 6.3 10*3/uL (ref 3.4–10.8)

## 2016-02-09 LAB — LIPID PANEL
CHOL/HDL RATIO: 2.5 ratio (ref 0.0–4.4)
CHOLESTEROL TOTAL: 232 mg/dL — AB (ref 100–199)
HDL: 92 mg/dL (ref >39)
LDL Calculated: 131 mg/dL — ABNORMAL HIGH (ref 0–99)
TRIGLYCERIDES: 45 mg/dL (ref 0–149)
VLDL Cholesterol Cal: 9 mg/dL (ref 5–40)

## 2016-02-09 LAB — COMPREHENSIVE METABOLIC PANEL
A/G RATIO: 2.4 — AB (ref 1.2–2.2)
ALK PHOS: 69 IU/L (ref 39–117)
ALT: 14 IU/L (ref 0–32)
AST: 23 IU/L (ref 0–40)
Albumin: 4.8 g/dL (ref 3.5–5.5)
BILIRUBIN TOTAL: 1.5 mg/dL — AB (ref 0.0–1.2)
BUN / CREAT RATIO: 22 (ref 9–23)
BUN: 15 mg/dL (ref 6–24)
CO2: 27 mmol/L (ref 18–29)
Calcium: 9.9 mg/dL (ref 8.7–10.2)
Chloride: 99 mmol/L (ref 96–106)
Creatinine, Ser: 0.69 mg/dL (ref 0.57–1.00)
GFR calc Af Amer: 111 mL/min/{1.73_m2} (ref >59)
GFR calc non Af Amer: 96 mL/min/{1.73_m2} (ref >59)
GLOBULIN, TOTAL: 2 g/dL (ref 1.5–4.5)
Glucose: 81 mg/dL (ref 65–99)
POTASSIUM: 3.7 mmol/L (ref 3.5–5.2)
SODIUM: 143 mmol/L (ref 134–144)
Total Protein: 6.8 g/dL (ref 6.0–8.5)

## 2016-02-09 LAB — TSH: TSH: 1.98 u[IU]/mL (ref 0.450–4.500)

## 2016-02-09 LAB — HEPATITIS C ANTIBODY: Hep C Virus Ab: <0.1 s/co ratio (ref 0.0–0.9)

## 2016-02-13 ENCOUNTER — Other Ambulatory Visit: Payer: Self-pay

## 2016-02-13 ENCOUNTER — Encounter (INDEPENDENT_AMBULATORY_CARE_PROVIDER_SITE_OTHER): Payer: Self-pay

## 2016-02-13 DIAGNOSIS — Z1231 Encounter for screening mammogram for malignant neoplasm of breast: Secondary | ICD-10-CM

## 2016-02-14 ENCOUNTER — Encounter: Payer: Self-pay | Admitting: Internal Medicine

## 2016-02-15 ENCOUNTER — Other Ambulatory Visit: Payer: Self-pay | Admitting: Internal Medicine

## 2016-02-15 DIAGNOSIS — F331 Major depressive disorder, recurrent, moderate: Secondary | ICD-10-CM

## 2016-03-20 ENCOUNTER — Telehealth: Payer: Self-pay

## 2016-03-20 NOTE — Telephone Encounter (Signed)
I increased her Lexapro last visit to help with those symptoms.  I could prescribe additional medication but because those are controlled medications, she will need an OV with me in order to get them prescribed.
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
## 2016-03-20 NOTE — Telephone Encounter (Signed)
Left message to have patient call and schedule appt

## 2016-03-22 ENCOUNTER — Ambulatory Visit (INDEPENDENT_AMBULATORY_CARE_PROVIDER_SITE_OTHER): Payer: BLUE CROSS/BLUE SHIELD | Admitting: Internal Medicine

## 2016-03-22 ENCOUNTER — Encounter: Payer: Self-pay | Admitting: Internal Medicine

## 2016-03-22 VITALS — BP 128/80 | HR 79 | Ht 64.0 in | Wt 145.0 lb

## 2016-03-22 DIAGNOSIS — F32A Depression, unspecified: Secondary | ICD-10-CM

## 2016-03-22 DIAGNOSIS — F329 Major depressive disorder, single episode, unspecified: Secondary | ICD-10-CM

## 2016-03-22 DIAGNOSIS — F418 Other specified anxiety disorders: Secondary | ICD-10-CM | POA: Diagnosis not present

## 2016-03-22 DIAGNOSIS — F331 Major depressive disorder, recurrent, moderate: Secondary | ICD-10-CM

## 2016-03-22 DIAGNOSIS — F419 Anxiety disorder, unspecified: Secondary | ICD-10-CM

## 2016-03-22 MED ORDER — BUSPIRONE HCL 10 MG PO TABS: 10.0000 mg | ORAL_TABLET | Freq: Three times a day (TID) | ORAL | 0 refills | Status: DC
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
## 2016-04-23 ENCOUNTER — Telehealth: Payer: Self-pay | Admitting: Internal Medicine

## 2016-04-23 ENCOUNTER — Other Ambulatory Visit: Payer: Self-pay | Admitting: Internal Medicine

## 2016-04-23 DIAGNOSIS — F419 Anxiety disorder, unspecified: Principal | ICD-10-CM

## 2016-04-23 DIAGNOSIS — F329 Major depressive disorder, single episode, unspecified: Secondary | ICD-10-CM

## 2016-04-23 DIAGNOSIS — F32A Depression, unspecified: Secondary | ICD-10-CM

## 2016-04-23 MED ORDER — BUSPIRONE HCL 10 MG PO TABS: 10.0000 mg | ORAL_TABLET | Freq: Three times a day (TID) | ORAL | 5 refills | Status: DC

## 2016-04-23 NOTE — Telephone Encounter (Signed)
Pt called need refill on Rx. Buspar

## 2016-05-29 ENCOUNTER — Other Ambulatory Visit: Payer: Self-pay | Admitting: Internal Medicine

## 2016-05-29 DIAGNOSIS — M5442 Lumbago with sciatica, left side: Principal | ICD-10-CM

## 2016-05-29 DIAGNOSIS — G8929 Other chronic pain: Secondary | ICD-10-CM

## 2016-06-19 ENCOUNTER — Other Ambulatory Visit: Payer: Self-pay | Admitting: Internal Medicine

## 2016-06-19 DIAGNOSIS — G8929 Other chronic pain: Secondary | ICD-10-CM

## 2016-06-19 DIAGNOSIS — M5442 Lumbago with sciatica, left side: Principal | ICD-10-CM

## 2016-08-16 ENCOUNTER — Ambulatory Visit: Payer: Self-pay | Admitting: General Surgery

## 2016-08-29 ENCOUNTER — Other Ambulatory Visit: Payer: Self-pay | Admitting: Internal Medicine

## 2016-08-29 DIAGNOSIS — F331 Major depressive disorder, recurrent, moderate: Secondary | ICD-10-CM

## 2016-09-13 ENCOUNTER — Encounter: Payer: Self-pay | Admitting: Internal Medicine

## 2016-09-13 ENCOUNTER — Encounter: Payer: Self-pay | Admitting: General Surgery

## 2016-09-13 ENCOUNTER — Ambulatory Visit (INDEPENDENT_AMBULATORY_CARE_PROVIDER_SITE_OTHER): Payer: BLUE CROSS/BLUE SHIELD | Admitting: Internal Medicine

## 2016-09-13 VITALS — BP 120/66 | HR 71 | Ht 64.0 in | Wt 150.0 lb

## 2016-09-13 DIAGNOSIS — R252 Cramp and spasm: Secondary | ICD-10-CM | POA: Insufficient documentation

## 2016-09-13 DIAGNOSIS — R300 Dysuria: Secondary | ICD-10-CM | POA: Diagnosis not present

## 2016-09-13 DIAGNOSIS — D485 Neoplasm of uncertain behavior of skin: Secondary | ICD-10-CM

## 2016-09-13 DIAGNOSIS — S46911A Strain of unspecified muscle, fascia and tendon at shoulder and upper arm level, right arm, initial encounter: Secondary | ICD-10-CM

## 2016-09-13 HISTORY — DX: Strain of unspecified muscle, fascia and tendon at shoulder and upper arm level, right arm, initial encounter: S46.911A

## 2016-09-13 LAB — POCT URINALYSIS DIPSTICK
BILIRUBIN UA: NEGATIVE
Glucose, UA: NEGATIVE
KETONES UA: NEGATIVE
Leukocytes, UA: NEGATIVE
NITRITE UA: NEGATIVE
PH UA: 6.5 (ref 5.0–8.0)
Protein, UA: NEGATIVE
Spec Grav, UA: <=1.005 — AB (ref 1.010–1.025)
Urobilinogen, UA: 0.2 E.U./dL

## 2016-09-13 NOTE — Patient Instructions (Signed)
Dermatology evaluation

## 2016-09-13 NOTE — Progress Notes (Signed)
Date:  09/13/2016   Name:  Desiree Grant   DOB:  07-13-1955   MRN:  [id number]   Chief Complaint: Urinary Tract Infection (X 2 weeks ago- Bought AZO and relieved some symtpoms but still having odor. Was having pain when using bathroom and hurting in left side. ) and thumb growth (Left thumb spot popped up on thumb 5-6 months ago. Tried pulling it off and never healed back up. Won't go away. Feels hot to touch. ) Urinary Tract Infection   This is a new problem. The current episode started in the past 7 days. The problem has been resolved. The patient is experiencing no pain. There has been no fever. Pertinent negatives include no chills or hematuria. She has tried increased fluids (AZO) for the symptoms.   Shoulder pain - right shoulder discomfort and reduced range of motion.  No weakness or injury.  She has been gardening lately.  Skin lesion - arose on left thumb about 6 months ago.  Started when she pulled back a piece of dry skin that she says was not a wart.  Now thickened and heaped up - originally smooth now warty and raised.  Thigh cramp - has had several cramps in left inner thigh over the past few months.  No injury or swelling.  Lasts about 1 minute and occurs at night.  She drinks water but no gatorade or other electrolyte fluids.     Review of Systems  Constitutional: Negative for chills, fatigue and fever.  Respiratory: Negative for chest tightness and shortness of breath.   Cardiovascular: Negative for chest pain, palpitations and leg swelling.  Gastrointestinal: Negative for abdominal pain.  Genitourinary: Negative for dysuria, hematuria and pelvic pain.  Musculoskeletal: Positive for arthralgias and myalgias. Negative for gait problem, joint swelling and neck stiffness.  Neurological: Negative for dizziness and headaches.    Patient Active Problem List   Diagnosis Date Noted  . Anxiety and depression 03/22/2016  . Adenomatous colon polyp 02/07/2015  .  Hyperlipidemia, mild 02/06/2015  . Fatigue 10/26/2014  . Fibrocystic breast changes 10/26/2014  . Depression, major, recurrent, moderate ([place]) 10/26/2014    Prior to Admission medications   Medication Sig Start Date End Date Taking? Authorizing Provider  busPIRone (BUSPAR) 10 MG tablet Take 1 tablet (10 mg total) by mouth 3 (three) times daily. 04/23/16  Yes Glean Hess, MD  cyclobenzaprine (FLEXERIL) 10 MG tablet TAKE 1 TABLET(10 MG) BY MOUTH AT BEDTIME 06/19/16  Yes Glean Hess, MD  escitalopram (LEXAPRO) 20 MG tablet TAKE 1 TABLET(20 MG) BY MOUTH DAILY 08/29/16  Yes Glean Hess, MD    Allergies  Allergen Reactions  . Niacin And Related Other (See Comments)    blisters    Past Surgical History:  Procedure Laterality Date  . COLONOSCOPY W/ POLYPECTOMY  2012   tubular adenoma 0.5cm was removed from descending colon  . PARTIAL HYSTERECTOMY     bleeding; cervix remains    Social History  Substance Use Topics  . Smoking status: Never Smoker  . Smokeless tobacco: Never Used  . Alcohol use No     Medication list has been reviewed and updated.   Physical Exam  Constitutional: She is oriented to person, place, and time. She appears well-developed. No distress.  HENT:  Head: Normocephalic and atraumatic.  Neck: Normal range of motion. Neck supple. No thyromegaly present.  Cardiovascular: Normal rate, regular rhythm and normal heart sounds.   Pulmonary/Chest: Effort normal and breath sounds  normal. No respiratory distress. She has no wheezes.  Musculoskeletal: She exhibits no edema.       Right shoulder: She exhibits decreased range of motion and tenderness. She exhibits no bony tenderness, no swelling, no effusion, normal pulse and normal strength.  Inner left thigh without swelling, mildly tender Strength normal.  ROM of knee normal. No calf tenderness, swelling or cord. No posterior knee fullness.  Neurological: She is alert and oriented to person, place,  and time. She has normal strength.  Skin: Skin is warm and dry. No rash noted.  94mm warty lesion on left thumb  Psychiatric: She has a normal mood and affect. Her behavior is normal. Thought content normal.  Nursing note and vitals reviewed.   BP 120/66 (BP Location: Right Arm, Patient Position: Sitting, Cuff Size: Normal)   Pulse 71   Ht 5\' 4"  (1.626 m)   Wt 150 lb (68 kg)   SpO2 100%   BMI 25.75 kg/m   Assessment and Plan: 1. Neoplasm of uncertain behavior of skin Consult dermatology  2. Muscle cramp, nocturnal Increase intake of electrolytes along with water  3. Shoulder strain, right, initial encounter Topical rubs, tylenol and ice/heat as needed  4. Dysuria UA negative - POCT urinalysis dipstick   No orders of the defined types were placed in this encounter.   Halina Maidens, MD [facility]  09/13/2016

## 2016-10-18 ENCOUNTER — Ambulatory Visit
Admission: EM | Admit: 2016-10-18 | Discharge: 2016-10-18 | Disposition: A | Discharge status: Home / Self Care | Payer: BLUE CROSS/BLUE SHIELD | Attending: Emergency Medicine | Admitting: Emergency Medicine

## 2016-10-18 ENCOUNTER — Encounter: Payer: Self-pay | Admitting: *Deleted

## 2016-10-18 ENCOUNTER — Ambulatory Visit (INDEPENDENT_AMBULATORY_CARE_PROVIDER_SITE_OTHER): Payer: BLUE CROSS/BLUE SHIELD

## 2016-10-18 DIAGNOSIS — R05 Cough: Secondary | ICD-10-CM | POA: Diagnosis not present

## 2016-10-18 DIAGNOSIS — R059 Cough, unspecified: Secondary | ICD-10-CM

## 2016-10-18 MED ORDER — PREDNISONE 10 MG (21) PO TBPK: ORAL_TABLET | ORAL | 0 refills | Status: DC

## 2016-10-18 MED ORDER — HYDROCOD POLST-CPM POLST ER 10-8 MG/5ML PO SUER: 5.0000 mL | Freq: Every evening | ORAL | 0 refills | Status: DC | PRN

## 2016-10-18 MED ORDER — BENZONATATE 100 MG PO CAPS: 100.0000 mg | ORAL_CAPSULE | Freq: Three times a day (TID) | ORAL | 0 refills | Status: DC | PRN

## 2016-10-18 NOTE — ED Triage Notes (Signed)
Patient started having symptom of cough 1 month ago. Patient reports coughing fits. No other symptoms reported.

## 2016-10-18 NOTE — Discharge Instructions (Signed)
Recommend start Prednisone 10mg  - take 6 tablet by mouth today then decrease by 1 tablet each day until finished on day 6. May use Tessalon cough pills 1 every 8 hours as needed. May take Tussionex cough syrup 1 teaspoon at bedtime as needed for cough. Recommend follow-up with your primary care provider in 5 to 6 days if not resolving.

## 2016-10-18 NOTE — ED Provider Notes (Signed)
CSN: [id number]     Arrival date & time 10/18/16  1352 History   First MD Initiated Contact with Patient 10/18/16 1421     Chief Complaint  Patient presents with  . Cough   (Consider location/radiation/quality/duration/timing/severity/associated sxs/prior Treatment) 61 year old female presents with a cough for over 1 month. Started with fever of 102, chest congestion and cough over 1 month ago. Fever and most of the congestion has resolved but cough lingers. No distinct nasal congestion, chest pain or GI symptoms. No history of GERD. No history of HTN or cardiac condition. Chest feels "tight". No history of allergies or asthma. Has tried OTC cough medication with minimal relief. Did try cough medication with codeine near [place] area in [place] with some relief. She does not smoke. Mom has history of COPD but no other family history of respiratory disease. No other chronic health issues except anxiety/stress and takes Lexapro and Buspar daily.    The history is provided by the patient.    Past Medical History:  Diagnosis Date  . Bowel trouble 2010  . Special screening for malignant neoplasms, colon 2012   Past Surgical History:  Procedure Laterality Date  . COLONOSCOPY W/ POLYPECTOMY  2012   tubular adenoma 0.5cm was removed from descending colon  . PARTIAL HYSTERECTOMY     bleeding; cervix remains   Family History  Problem Relation Age of Onset  . COPD Mother    Social History  Substance Use Topics  . Smoking status: Never Smoker  . Smokeless tobacco: Never Used  . Alcohol use No   OB History    No data available     Review of Systems  Constitutional: Positive for activity change and fatigue. Negative for appetite change, chills and fever.  HENT: Positive for congestion (minimal chest congestion). Negative for ear discharge, ear pain, mouth sores, postnasal drip, rhinorrhea, sinus pain, sinus pressure, sore throat and trouble swallowing.   Eyes: Negative for  photophobia, discharge, itching and visual disturbance.  Respiratory: Positive for cough, chest tightness, shortness of breath and wheezing.   Cardiovascular: Negative for chest pain, palpitations and leg swelling.  Gastrointestinal: Negative for abdominal pain, diarrhea, nausea and vomiting.  Genitourinary: Negative for difficulty urinating.  Musculoskeletal: Negative for arthralgias, back pain, myalgias, neck pain and neck stiffness.  Skin: Negative for rash and wound.  Allergic/Immunologic: Negative for immunocompromised state.  Neurological: Negative for dizziness, syncope, weakness, numbness and headaches.  Hematological: Negative for adenopathy.    Allergies  Niacin and related  Home Medications   Prior to Admission medications   Medication Sig Start Date End Date Taking? Authorizing Provider  busPIRone (BUSPAR) 10 MG tablet Take 1 tablet (10 mg total) by mouth 3 (three) times daily. 04/23/16  Yes Glean Hess, MD  escitalopram (LEXAPRO) 20 MG tablet TAKE 1 TABLET(20 MG) BY MOUTH DAILY 08/29/16  Yes Glean Hess, MD  benzonatate (TESSALON) 100 MG capsule Take 1 capsule (100 mg total) by mouth 3 (three) times daily as needed for cough. 10/18/16   Katy Apo, NP  chlorpheniramine-HYDROcodone (TUSSIONEX PENNKINETIC ER) 10-8 MG/5ML SUER Take 5 mLs by mouth at bedtime as needed for cough. 10/18/16   Katy Apo, NP  predniSONE (STERAPRED UNI-PAK 21 TAB) 10 MG (21) TBPK tablet Take 6 tabs by mouth on day 1 then decrease by 1 tablet each day until finished on day 6. 10/18/16   Kellie Chisolm, Nicholes Stairs, NP   Meds Ordered and Administered this Visit  Medications -  No data to display  BP 114/64 (BP Location: Left Arm)   Pulse 75   Temp 99.2 F (37.3 C) (Oral)   Resp 16   Ht 5\' 4"  (1.626 m)   Wt 150 lb (68 kg)   SpO2 99%   BMI 25.75 kg/m  No data found.   Physical Exam  Constitutional: She is oriented to person, place, and time. She appears well-developed and  well-nourished. No distress.  HENT:  Head: Normocephalic and atraumatic.  Right Ear: Hearing, tympanic membrane, external ear and ear canal normal.  Left Ear: Hearing, tympanic membrane, external ear and ear canal normal.  Nose: Nose normal. Right sinus exhibits no maxillary sinus tenderness and no frontal sinus tenderness. Left sinus exhibits no maxillary sinus tenderness and no frontal sinus tenderness.  Mouth/Throat: Uvula is midline, oropharynx is clear and moist and mucous membranes are normal.  Neck: Normal range of motion. Neck supple.  Cardiovascular: Normal rate, regular rhythm and normal heart sounds.   No murmur heard. Pulmonary/Chest: Effort normal. No accessory muscle usage. No respiratory distress. She has decreased breath sounds in the right upper field, the right lower field, the left upper field and the left lower field. She has no wheezes. She has rhonchi (when coughing) in the right upper field and the left upper field. She has no rales.  Musculoskeletal: Normal range of motion.  Lymphadenopathy:    She has no cervical adenopathy.  Neurological: She is alert and oriented to person, place, and time.  Skin: Skin is warm and dry. Capillary refill takes less than 2 seconds.  Psychiatric: She has a normal mood and affect. Her behavior is normal. Judgment and thought content normal.    Urgent Care Course     Procedures (including critical care time)  Labs Review Labs Reviewed - No data to display  Imaging Review Dg Chest 2 View  Result Date: 10/18/2016 CLINICAL DATA:  Cough for 1 month, some shortness of breath and chest tightness EXAM: CHEST  2 VIEW COMPARISON:  Chest x-ray of 08/31/2011 FINDINGS: No active infiltrate or effusion is seen. Mediastinal and hilar contours are unremarkable. The heart is within normal limits in size. No bony abnormality is seen. IMPRESSION: No active cardiopulmonary disease. Electronically Signed   By: Ivar Drape M.D.   On: 10/18/2016 14:59      Visual Acuity Review  Right Eye Distance:   Left Eye Distance:   Bilateral Distance:    Right Eye Near:   Left Eye Near:    Bilateral Near:         MDM   1. Cough    Reviewed negative chest x-ray results with patient. Discussed that cough can linger after viral illness for many weeks. May be bronchial irritation. Offered Albuterol inhaler but patient declined. Will trial Prednisone 10mg  6 day dose pack as directed. May use Tessalon cough pills 1 every 8 hours as needed. May take Tussionex cough syrup 1 teaspoon at bedtime as needed for cough. [place] Controlled Substance Registry Reviewed- last active Rx in Feb 2018 was for Phentermine. No other active Rx present for the past 6 months. Recommend follow-up with her primary care provider in 5 to 6 days if not resolving or go to ER if cough/shortness of breath worsens.      Katy Apo, NP 10/18/16 1610

## 2016-11-15 ENCOUNTER — Ambulatory Visit: Payer: Self-pay | Admitting: General Surgery

## 2016-12-06 ENCOUNTER — Other Ambulatory Visit: Payer: Self-pay

## 2016-12-06 DIAGNOSIS — F331 Major depressive disorder, recurrent, moderate: Secondary | ICD-10-CM

## 2016-12-06 MED ORDER — ESCITALOPRAM OXALATE 20 MG PO TABS: ORAL_TABLET | ORAL | 0 refills | Status: DC

## 2017-01-09 ENCOUNTER — Other Ambulatory Visit: Payer: Self-pay | Admitting: Internal Medicine

## 2017-01-09 DIAGNOSIS — F331 Major depressive disorder, recurrent, moderate: Secondary | ICD-10-CM

## 2017-01-09 DIAGNOSIS — G8929 Other chronic pain: Secondary | ICD-10-CM

## 2017-01-09 DIAGNOSIS — M5442 Lumbago with sciatica, left side: Principal | ICD-10-CM

## 2017-02-11 ENCOUNTER — Other Ambulatory Visit: Payer: Self-pay | Admitting: Internal Medicine

## 2017-02-11 DIAGNOSIS — F32A Depression, unspecified: Secondary | ICD-10-CM

## 2017-02-11 DIAGNOSIS — F419 Anxiety disorder, unspecified: Principal | ICD-10-CM

## 2017-02-11 DIAGNOSIS — F329 Major depressive disorder, single episode, unspecified: Secondary | ICD-10-CM

## 2017-02-25 ENCOUNTER — Ambulatory Visit: Payer: Self-pay | Admitting: General Surgery

## 2017-04-09 ENCOUNTER — Other Ambulatory Visit: Payer: Self-pay | Admitting: Internal Medicine

## 2017-04-09 DIAGNOSIS — G8929 Other chronic pain: Secondary | ICD-10-CM

## 2017-04-09 DIAGNOSIS — M5442 Lumbago with sciatica, left side: Principal | ICD-10-CM

## 2017-04-24 ENCOUNTER — Ambulatory Visit (INDEPENDENT_AMBULATORY_CARE_PROVIDER_SITE_OTHER): Payer: BLUE CROSS/BLUE SHIELD | Admitting: Internal Medicine

## 2017-04-24 ENCOUNTER — Encounter: Payer: Self-pay | Admitting: Internal Medicine

## 2017-04-24 VITALS — BP 112/68 | HR 74 | Temp 98.5°F | Ht 64.0 in | Wt 156.0 lb

## 2017-04-24 DIAGNOSIS — J0100 Acute maxillary sinusitis, unspecified: Secondary | ICD-10-CM

## 2017-04-24 DIAGNOSIS — J40 Bronchitis, not specified as acute or chronic: Secondary | ICD-10-CM

## 2017-04-24 MED ORDER — GUAIFENESIN-CODEINE 100-10 MG/5ML PO SYRP: 5.0000 mL | ORAL_SOLUTION | Freq: Three times a day (TID) | ORAL | 0 refills | Status: DC | PRN

## 2017-04-24 MED ORDER — AMOXICILLIN-POT CLAVULANATE 875-125 MG PO TABS: 1.0000 | ORAL_TABLET | Freq: Two times a day (BID) | ORAL | 0 refills | Status: AC

## 2017-04-24 NOTE — Progress Notes (Signed)
Date:  04/24/2017   Name:  Desiree Grant   DOB:  1955-09-11   MRN:  [id number]   Chief Complaint: Sore Throat (Sore throat, congestion- yellow mucous. Night is worse. )  Sore Throat   This is a new problem. The current episode started in the past 7 days. The problem has been unchanged. There has been no fever. Associated symptoms include congestion, coughing, headaches and trouble swallowing. Pertinent negatives include no abdominal pain, diarrhea, shortness of breath or swollen glands. She has had no exposure to strep or mono. She has tried nothing for the symptoms.     Review of Systems  Constitutional: Positive for fever. Negative for chills and fatigue.  HENT: Positive for congestion, sinus pressure and trouble swallowing.   Eyes: Negative for visual disturbance.  Respiratory: Positive for cough. Negative for shortness of breath and wheezing.   Cardiovascular: Negative for chest pain and palpitations.  Gastrointestinal: Negative for abdominal pain, diarrhea and nausea.  Neurological: Positive for headaches. Negative for dizziness and numbness.    Patient Active Problem List   Diagnosis Date Noted  . Shoulder strain, right, initial encounter 09/13/2016  . Neoplasm of uncertain behavior of skin 09/13/2016  . Muscle cramp, nocturnal 09/13/2016  . Anxiety and depression 03/22/2016  . Adenomatous colon polyp 02/07/2015  . Hyperlipidemia, mild 02/06/2015  . Fatigue 10/26/2014  . Fibrocystic breast changes 10/26/2014  . Depression, major, recurrent, moderate ([place]) 10/26/2014    Prior to Admission medications   Medication Sig Start Date End Date Taking? Authorizing Provider  busPIRone (BUSPAR) 10 MG tablet TAKE 1 TABLET(10 MG) BY MOUTH THREE TIMES DAILY 02/12/17  Yes Glean Hess, MD  cyclobenzaprine (FLEXERIL) 10 MG tablet TAKE 1 TABLET(10 MG) BY MOUTH AT BEDTIME 04/09/17  Yes Glean Hess, MD  escitalopram (LEXAPRO) 20 MG tablet TAKE 1 TABLET(20 MG) BY MOUTH DAILY  01/09/17  Yes Glean Hess, MD    Allergies  Allergen Reactions  . Niacin And Related Other (See Comments)    blisters    Past Surgical History:  Procedure Laterality Date  . COLONOSCOPY W/ POLYPECTOMY  2012   tubular adenoma 0.5cm was removed from descending colon  . PARTIAL HYSTERECTOMY     bleeding; cervix remains    Social History   Tobacco Use  . Smoking status: Never Smoker  . Smokeless tobacco: Never Used  Substance Use Topics  . Alcohol use: No    Alcohol/week: 0.0 oz  . Drug use: No     Medication list has been reviewed and updated.  PHQ 2/9 Scores 02/08/2016  PHQ - 2 Score 0    Physical Exam  Constitutional: She is oriented to person, place, and time. She appears well-developed and well-nourished.  HENT:  Right Ear: External ear and ear canal normal. Tympanic membrane is retracted. Tympanic membrane is not erythematous.  Left Ear: External ear and ear canal normal. Tympanic membrane is retracted. Tympanic membrane is not erythematous.  Nose: Right sinus exhibits maxillary sinus tenderness and frontal sinus tenderness. Left sinus exhibits maxillary sinus tenderness and frontal sinus tenderness.  Mouth/Throat: Uvula is midline and mucous membranes are normal. No oral lesions. No oropharyngeal exudate or posterior oropharyngeal erythema.  Cardiovascular: Normal rate, regular rhythm and normal heart sounds.  Pulmonary/Chest: Breath sounds normal. She has no wheezes. She has no rales.  Lymphadenopathy:    She has no cervical adenopathy.  Neurological: She is alert and oriented to person, place, and time.    BP 112/68  Pulse 74   Temp 98.5 F (36.9 C) (Oral)   Ht 5\' 4"  (1.626 m)   Wt 156 lb (70.8 kg)   SpO2 98%   BMI 26.78 kg/m   Assessment and Plan: 1. Acute maxillary sinusitis, recurrence not specified Fluids, tylenol or advil for HA and sore throat - amoxicillin-clavulanate (AUGMENTIN) 875-125 MG tablet; Take 1 tablet by mouth 2 (two) times  daily for 10 days.  Dispense: 20 tablet; Refill: 0  2. Bronchitis - guaiFENesin-codeine (ROBITUSSIN AC) 100-10 MG/5ML syrup; Take 5 mLs by mouth 3 (three) times daily as needed for cough.  Dispense: 118 mL; Refill: 0   Meds ordered this encounter  Medications  . amoxicillin-clavulanate (AUGMENTIN) 875-125 MG tablet    Sig: Take 1 tablet by mouth 2 (two) times daily for 10 days.    Dispense:  20 tablet    Refill:  0  . guaiFENesin-codeine (ROBITUSSIN AC) 100-10 MG/5ML syrup    Sig: Take 5 mLs by mouth 3 (three) times daily as needed for cough.    Dispense:  118 mL    Refill:  0    Partially dictated using Editor, commissioning. Any errors are unintentional.  Halina Maidens, MD [facility]  04/24/2017

## 2017-04-24 NOTE — Patient Instructions (Signed)
Advil or Tylenol for other symptoms

## 2017-05-01 ENCOUNTER — Other Ambulatory Visit: Payer: Self-pay | Admitting: Internal Medicine

## 2017-05-01 ENCOUNTER — Encounter: Payer: Self-pay | Admitting: Internal Medicine

## 2017-05-01 DIAGNOSIS — J40 Bronchitis, not specified as acute or chronic: Secondary | ICD-10-CM

## 2017-05-02 ENCOUNTER — Other Ambulatory Visit: Payer: Self-pay | Admitting: Internal Medicine

## 2017-05-02 MED ORDER — BENZONATATE 100 MG PO CAPS: 100.0000 mg | ORAL_CAPSULE | Freq: Three times a day (TID) | ORAL | 0 refills | Status: DC

## 2017-05-02 NOTE — Telephone Encounter (Signed)
Patient MyChart message requesting more medications. Please Advise.

## 2017-05-02 NOTE — Telephone Encounter (Signed)
Patient would like perles sent to [facility] in [place]. Thanks.

## 2017-05-16 ENCOUNTER — Other Ambulatory Visit: Payer: Self-pay | Admitting: Internal Medicine

## 2017-05-16 DIAGNOSIS — F329 Major depressive disorder, single episode, unspecified: Secondary | ICD-10-CM

## 2017-05-16 DIAGNOSIS — F32A Depression, unspecified: Secondary | ICD-10-CM

## 2017-05-16 DIAGNOSIS — F419 Anxiety disorder, unspecified: Principal | ICD-10-CM

## 2017-07-14 ENCOUNTER — Other Ambulatory Visit: Payer: Self-pay | Admitting: Internal Medicine

## 2017-07-14 DIAGNOSIS — F331 Major depressive disorder, recurrent, moderate: Secondary | ICD-10-CM

## 2017-08-12 ENCOUNTER — Other Ambulatory Visit: Payer: Self-pay | Admitting: Internal Medicine

## 2017-08-12 DIAGNOSIS — F331 Major depressive disorder, recurrent, moderate: Secondary | ICD-10-CM

## 2017-08-12 DIAGNOSIS — G8929 Other chronic pain: Secondary | ICD-10-CM

## 2017-08-12 DIAGNOSIS — M5442 Lumbago with sciatica, left side: Secondary | ICD-10-CM

## 2017-08-22 ENCOUNTER — Other Ambulatory Visit: Payer: Self-pay

## 2017-08-22 ENCOUNTER — Encounter: Payer: Self-pay | Admitting: *Deleted

## 2017-08-29 ENCOUNTER — Encounter: Payer: Self-pay | Admitting: Internal Medicine

## 2017-08-29 ENCOUNTER — Ambulatory Visit (INDEPENDENT_AMBULATORY_CARE_PROVIDER_SITE_OTHER): Payer: BLUE CROSS/BLUE SHIELD | Admitting: Internal Medicine

## 2017-08-29 VITALS — BP 124/80 | HR 81 | Ht 64.0 in | Wt 158.0 lb

## 2017-08-29 DIAGNOSIS — Z0001 Encounter for general adult medical examination with abnormal findings: Secondary | ICD-10-CM

## 2017-08-29 DIAGNOSIS — M5442 Lumbago with sciatica, left side: Secondary | ICD-10-CM

## 2017-08-29 DIAGNOSIS — F331 Major depressive disorder, recurrent, moderate: Secondary | ICD-10-CM | POA: Diagnosis not present

## 2017-08-29 DIAGNOSIS — M25562 Pain in left knee: Secondary | ICD-10-CM | POA: Diagnosis not present

## 2017-08-29 DIAGNOSIS — Z0000 Encounter for general adult medical examination without abnormal findings: Secondary | ICD-10-CM

## 2017-08-29 DIAGNOSIS — G8929 Other chronic pain: Secondary | ICD-10-CM

## 2017-08-29 DIAGNOSIS — E785 Hyperlipidemia, unspecified: Secondary | ICD-10-CM | POA: Diagnosis not present

## 2017-08-29 LAB — POCT URINALYSIS DIPSTICK
BILIRUBIN UA: NEGATIVE
GLUCOSE UA: NEGATIVE
KETONES UA: NEGATIVE
Leukocytes, UA: NEGATIVE
Nitrite, UA: NEGATIVE
Spec Grav, UA: 1.015 (ref 1.010–1.025)
UROBILINOGEN UA: 0.2 U/dL
pH, UA: 6 (ref 5.0–8.0)

## 2017-08-29 MED ORDER — CYCLOBENZAPRINE HCL 10 MG PO TABS: 10.0000 mg | ORAL_TABLET | Freq: Every day | ORAL | 5 refills | Status: DC

## 2017-08-29 MED ORDER — ESCITALOPRAM OXALATE 20 MG PO TABS: 20.0000 mg | ORAL_TABLET | Freq: Every day | ORAL | 5 refills | Status: DC

## 2017-08-29 NOTE — Discharge Instructions (Signed)

## 2017-08-29 NOTE — Patient Instructions (Addendum)
Tylenol 500 mg 2 tablets an hour before bed  Claritin 10 mg once a day as needed for post nasal drip, allergies  [facility] [phone]

## 2017-08-29 NOTE — Progress Notes (Signed)
Date:  08/29/2017   Name:  Desiree Grant   DOB:  01-16-1956   MRN:  [id number]   Chief Complaint: Annual Exam (Breast Exam. No pap-hysterecomy.); Depression (PHQ9- 15); and Leg Pain (Mostly pain is at night. Pain is located in top left thigh. 6-8 months. Getting worse. Monday night had bad leg cramps and almsot called 911. )  Desiree Grant is a 62 y.o. female who presents today for her Complete Annual Exam. She feels fairly well. She reports exercising working in the yard. She reports she is sleeping fairly well. She is due for mammogram and colonoscopy but does not want to do them.  She thinks that she has a cervix and it has been a long time since her last Pap.  Depression         This is a chronic problem.  The current episode started more than 1 year ago.   The problem occurs daily.The problem is unchanged.  Associated symptoms include no fatigue, no headaches and no suicidal ideas (but some fleeting thoughts of escape).     The symptoms are aggravated by social issues and family issues.  Past treatments include SSRIs - Selective serotonin reuptake inhibitors.  Compliance with treatment is good.  Previous treatment provided moderate relief. She tries to stay busy in the yard and going [place] the lake.    Review of Systems  Constitutional: Negative for chills, fatigue and fever.  HENT: Negative for congestion, hearing loss, tinnitus, trouble swallowing and voice change.   Eyes: Negative for visual disturbance.  Respiratory: Negative for cough, chest tightness, shortness of breath and wheezing.   Cardiovascular: Negative for chest pain, palpitations and leg swelling.  Gastrointestinal: Negative for abdominal pain, constipation, diarrhea and vomiting.  Endocrine: Negative for polydipsia and polyuria.  Genitourinary: Negative for dysuria, frequency, genital sores, vaginal bleeding and vaginal discharge.  Musculoskeletal: Negative for arthralgias, gait problem and joint swelling.  Skin:  Negative for color change and rash.  Neurological: Negative for dizziness, tremors, light-headedness and headaches.  Hematological: Negative for adenopathy. Does not bruise/bleed easily.  Psychiatric/Behavioral: Positive for depression, dysphoric mood and sleep disturbance. Negative for agitation, behavioral problems, hallucinations and suicidal ideas (but some fleeting thoughts of escape). The patient is not nervous/anxious.     Patient Active Problem List   Diagnosis Date Noted  . Shoulder strain, right, initial encounter 09/13/2016  . Neoplasm of uncertain behavior of skin 09/13/2016  . Muscle cramp, nocturnal 09/13/2016  . Anxiety and depression 03/22/2016  . Adenomatous colon polyp 02/07/2015  . Hyperlipidemia, mild 02/06/2015  . Fatigue 10/26/2014  . Fibrocystic breast changes 10/26/2014  . Depression, major, recurrent, moderate ([place]) 10/26/2014    Prior to Admission medications   Medication Sig Start Date End Date Taking? Authorizing Provider  busPIRone (BUSPAR) 10 MG tablet TAKE 1 TABLET(10 MG) BY MOUTH THREE TIMES DAILY 05/16/17  Yes Glean Hess, MD  cyclobenzaprine (FLEXERIL) 10 MG tablet TAKE 1 TABLET(10 MG) BY MOUTH AT BEDTIME 08/13/17  Yes Glean Hess, MD  escitalopram (LEXAPRO) 20 MG tablet TAKE 1 TABLET(20 MG) BY MOUTH DAILY 08/13/17  Yes Glean Hess, MD    Allergies  Allergen Reactions  . Niacin And Related Hives    blisters    Past Surgical History:  Procedure Laterality Date  . COLONOSCOPY W/ POLYPECTOMY  2012   tubular adenoma 0.5cm was removed from descending colon  . FACIAL COSMETIC SURGERY  2015  . PARTIAL HYSTERECTOMY     bleeding;  cervix remains    Social History   Tobacco Use  . Smoking status: Never Smoker  . Smokeless tobacco: Never Used  Substance Use Topics  . Alcohol use: No    Alcohol/week: 0.0 oz  . Drug use: No     Medication list has been reviewed and updated.  PHQ 2/9 Scores 08/29/2017 02/08/2016  PHQ - 2 Score 5 0    PHQ- 9 Score 15 -    Physical Exam  Constitutional: She is oriented to person, place, and time. She appears well-developed and well-nourished. No distress.  HENT:  Head: Normocephalic and atraumatic.  Right Ear: Tympanic membrane and ear canal normal.  Left Ear: Tympanic membrane and ear canal normal.  Nose: Right sinus exhibits no maxillary sinus tenderness. Left sinus exhibits no maxillary sinus tenderness.  Mouth/Throat: Uvula is midline and oropharynx is clear and moist.  Eyes: Conjunctivae and EOM are normal. Right eye exhibits no discharge. Left eye exhibits no discharge. No scleral icterus.  Neck: Normal range of motion. Carotid bruit is not present. No erythema present. No thyromegaly present.  Cardiovascular: Normal rate, regular rhythm, normal heart sounds and normal pulses.  Pulmonary/Chest: Effort normal. No respiratory distress. She has no wheezes. Right breast exhibits no mass, no nipple discharge, no skin change and no tenderness. Left breast exhibits no mass, no nipple discharge, no skin change and no tenderness.  Abdominal: Soft. Bowel sounds are normal. There is no hepatosplenomegaly. There is no tenderness. There is no CVA tenderness.  Genitourinary: Rectum normal and vagina normal. There is no tenderness, lesion or injury on the right labia. There is no tenderness, lesion or injury on the left labia. Cervix exhibits no motion tenderness, no discharge and no friability. Right adnexum displays no mass, no tenderness and no fullness. Left adnexum displays no mass, no tenderness and no fullness.  Genitourinary Comments: Uterus absent  Musculoskeletal: Normal range of motion.       Left knee: Tenderness (with crepitus) found. Medial joint line tenderness noted.  Lymphadenopathy:    She has no cervical adenopathy.    She has no axillary adenopathy.  Neurological: She is alert and oriented to person, place, and time. She has normal reflexes. No cranial nerve deficit or sensory  deficit.  Skin: Skin is warm, dry and intact. No rash noted.  Psychiatric: Her speech is normal and behavior is normal. Judgment and thought content normal. Cognition and memory are normal. She exhibits a depressed mood. She expresses no suicidal (she says that she is too scared to harm herself) ideation. She expresses no suicidal plans and no homicidal plans.  Nursing note and vitals reviewed.   BP 124/80   Pulse 81   Ht 5\' 4"  (1.626 m)   Wt 158 lb (71.7 kg)   SpO2 98%   BMI 27.12 kg/m   Assessment and Plan: 1. Annual physical exam Pt declines mammogram and colonoscopy at this time - CBC with Differential/Platelet - Comprehensive metabolic panel - POCT urinalysis dipstick - Pap IG and HPV (high risk) DNA detection  2. Depression, major, recurrent, moderate ([place]) Not controlled - pt strongly encouraged to see Psych care - suggested ARPA and number for CBC given - TSH - escitalopram (LEXAPRO) 20 MG tablet; Take 1 tablet (20 mg total) by mouth daily.  Dispense: 30 tablet; Refill: 5  3. Hyperlipidemia, mild - Lipid panel  4. Chronic pain of left knee Causing some thigh pain Begin tylenol 1000 mg at bedtime  5. Chronic bilateral low back pain  with left-sided sciatica Continue flexeril as needed - cyclobenzaprine (FLEXERIL) 10 MG tablet; Take 1 tablet (10 mg total) by mouth at bedtime.  Dispense: 30 tablet; Refill: 5   Meds ordered this encounter  Medications  . cyclobenzaprine (FLEXERIL) 10 MG tablet    Sig: Take 1 tablet (10 mg total) by mouth at bedtime.    Dispense:  30 tablet    Refill:  5  . escitalopram (LEXAPRO) 20 MG tablet    Sig: Take 1 tablet (20 mg total) by mouth daily.    Dispense:  30 tablet    Refill:  5    Partially dictated using Editor, commissioning. Any errors are unintentional.  Halina Maidens, MD [facility]  08/29/2017

## 2017-08-30 LAB — CBC WITH DIFFERENTIAL/PLATELET
Basophils Absolute: 0 10*3/uL (ref 0.0–0.2)
Basos: 1 %
EOS (ABSOLUTE): 0.1 10*3/uL (ref 0.0–0.4)
Eos: 2 %
Hematocrit: 44.4 % (ref 34.0–46.6)
Hemoglobin: 14.4 g/dL (ref 11.1–15.9)
Immature Grans (Abs): 0 10*3/uL (ref 0.0–0.1)
Immature Granulocytes: 0 %
Lymphocytes Absolute: 1.2 10*3/uL (ref 0.7–3.1)
Lymphs: 30 %
MCH: 28.2 pg (ref 26.6–33.0)
MCHC: 32.4 g/dL (ref 31.5–35.7)
MCV: 87 fL (ref 79–97)
Monocytes Absolute: 0.3 10*3/uL (ref 0.1–0.9)
Monocytes: 9 %
Neutrophils Absolute: 2.2 10*3/uL (ref 1.4–7.0)
Neutrophils: 58 %
Platelets: 190 10*3/uL (ref 150–379)
RBC: 5.1 x10E6/uL (ref 3.77–5.28)
RDW: 13.8 % (ref 12.3–15.4)
WBC: 3.8 10*3/uL (ref 3.4–10.8)

## 2017-08-30 LAB — COMPREHENSIVE METABOLIC PANEL
ALT: 13 IU/L (ref 0–32)
AST: 17 IU/L (ref 0–40)
Albumin/Globulin Ratio: 2.3 — ABNORMAL HIGH (ref 1.2–2.2)
Albumin: 4.6 g/dL (ref 3.6–4.8)
Alkaline Phosphatase: 74 IU/L (ref 39–117)
BUN/Creatinine Ratio: 13 (ref 12–28)
BUN: 11 mg/dL (ref 8–27)
Bilirubin Total: 1.1 mg/dL (ref 0.0–1.2)
CO2: 27 mmol/L (ref 20–29)
Calcium: 9.3 mg/dL (ref 8.7–10.3)
Chloride: 103 mmol/L (ref 96–106)
Creatinine, Ser: 0.86 mg/dL (ref 0.57–1.00)
GFR calc Af Amer: 85 mL/min/{1.73_m2} (ref >59)
GFR calc non Af Amer: 74 mL/min/{1.73_m2} (ref >59)
Globulin, Total: 2 g/dL (ref 1.5–4.5)
Glucose: 89 mg/dL (ref 65–99)
Potassium: 4.2 mmol/L (ref 3.5–5.2)
Sodium: 143 mmol/L (ref 134–144)
Total Protein: 6.6 g/dL (ref 6.0–8.5)

## 2017-08-30 LAB — LIPID PANEL
Chol/HDL Ratio: 3.2 ratio (ref 0.0–4.4)
Cholesterol, Total: 229 mg/dL — ABNORMAL HIGH (ref 100–199)
HDL: 71 mg/dL (ref >39)
LDL Calculated: 145 mg/dL — ABNORMAL HIGH (ref 0–99)
Triglycerides: 66 mg/dL (ref 0–149)
VLDL Cholesterol Cal: 13 mg/dL (ref 5–40)

## 2017-08-30 LAB — TSH: TSH: 1.16 u[IU]/mL (ref 0.450–4.500)

## 2017-09-02 ENCOUNTER — Encounter
Admission: RE | Disposition: A | Discharge status: Home / Self Care | Payer: Self-pay | Source: Ambulatory Visit | Attending: Ophthalmology

## 2017-09-02 ENCOUNTER — Ambulatory Visit
Admission: RE | Admit: 2017-09-02 | Discharge: 2017-09-02 | Disposition: A | Discharge status: Home / Self Care | Payer: BLUE CROSS/BLUE SHIELD | Source: Ambulatory Visit | Attending: Ophthalmology | Admitting: Ophthalmology

## 2017-09-02 ENCOUNTER — Ambulatory Visit: Payer: BLUE CROSS/BLUE SHIELD | Admitting: Anesthesiology

## 2017-09-02 DIAGNOSIS — F329 Major depressive disorder, single episode, unspecified: Secondary | ICD-10-CM | POA: Diagnosis not present

## 2017-09-02 DIAGNOSIS — H2511 Age-related nuclear cataract, right eye: Secondary | ICD-10-CM | POA: Diagnosis present

## 2017-09-02 DIAGNOSIS — Z79899 Other long term (current) drug therapy: Secondary | ICD-10-CM | POA: Diagnosis not present

## 2017-09-02 DIAGNOSIS — F419 Anxiety disorder, unspecified: Secondary | ICD-10-CM | POA: Diagnosis not present

## 2017-09-02 HISTORY — DX: Motion sickness, initial encounter: T75.3XXA

## 2017-09-02 HISTORY — PX: CATARACT EXTRACTION W/PHACO: SHX586

## 2017-09-02 SURGERY — PHACOEMULSIFICATION, CATARACT, WITH IOL INSERTION
Anesthesia: Monitor Anesthesia Care | Site: Eye | Laterality: Right | Wound class: "Clean "

## 2017-09-02 MED ORDER — EPINEPHRINE PF 1 MG/ML IJ SOLN
INTRAOCULAR | Status: DC | PRN
  Administered 2017-09-02: 47 mL via OPHTHALMIC

## 2017-09-02 MED ORDER — MIDAZOLAM HCL 2 MG/2ML IJ SOLN
INTRAMUSCULAR | Status: DC | PRN
  Administered 2017-09-02: 2 mg via INTRAVENOUS

## 2017-09-02 MED ORDER — BRIMONIDINE TARTRATE-TIMOLOL 0.2-0.5 % OP SOLN
OPHTHALMIC | Status: DC | PRN
  Administered 2017-09-02: 1 [drp] via OPHTHALMIC

## 2017-09-02 MED ORDER — MOXIFLOXACIN HCL 0.5 % OP SOLN
1.0000 [drp] | OPHTHALMIC | Status: DC | PRN
Start: 2017-09-02 — End: 2017-09-02
  Administered 2017-09-02 (×3): 1 [drp] via OPHTHALMIC

## 2017-09-02 MED ORDER — CEFUROXIME OPHTHALMIC INJECTION 1 MG/0.1 ML
INJECTION | OPHTHALMIC | Status: DC | PRN
  Administered 2017-09-02: .3 mL via OPHTHALMIC

## 2017-09-02 MED ORDER — LACTATED RINGERS IV SOLN: 10.0000 mL/h | INTRAVENOUS | Status: DC

## 2017-09-02 MED ORDER — LIDOCAINE HCL (PF) 2 % IJ SOLN
INTRAOCULAR | Status: DC | PRN
  Administered 2017-09-02: 1 mL via INTRAMUSCULAR

## 2017-09-02 MED ORDER — ARMC OPHTHALMIC DILATING DROPS
1.0000 "application " | OPHTHALMIC | Status: DC | PRN
  Administered 2017-09-02 (×3): 1 via OPHTHALMIC

## 2017-09-02 MED ORDER — NA HYALUR & NA CHOND-NA HYALUR 0.4-0.35 ML IO KIT
PACK | INTRAOCULAR | Status: DC | PRN
  Administered 2017-09-02: 1 mL via INTRAOCULAR

## 2017-09-02 MED ORDER — FENTANYL CITRATE (PF) 100 MCG/2ML IJ SOLN
INTRAMUSCULAR | Status: DC | PRN
  Administered 2017-09-02: 50 ug via INTRAVENOUS

## 2017-09-02 SURGICAL SUPPLY — 28 items
"Symfony Toric IOL extended range IOL 22.5D/1.50 D " (Intraocular Lens) IMPLANT
CANNULA ANT/CHMB 27G (MISCELLANEOUS) ×1 IMPLANT
CANNULA ANT/CHMB 27GA (MISCELLANEOUS) ×2 IMPLANT
CARTRIDGE ABBOTT (MISCELLANEOUS) IMPLANT
GLOVE SURG LX 7.5 STRW (GLOVE) ×1
GLOVE SURG LX STRL 7.5 STRW (GLOVE) ×1 IMPLANT
GLOVE SURG TRIUMPH 8.0 PF LTX (GLOVE) ×2 IMPLANT
GOWN STRL REUS W/ TWL LRG LVL3 (GOWN DISPOSABLE) ×2 IMPLANT
GOWN STRL REUS W/TWL LRG LVL3 (GOWN DISPOSABLE) ×2
MARKER SKIN DUAL TIP RULER LAB (MISCELLANEOUS) ×2 IMPLANT
NDL FILTER BLUNT 18X1 1/2 (NEEDLE) ×1 IMPLANT
NDL RETROBULBAR .5 NSTRL (NEEDLE) IMPLANT
NEEDLE FILTER BLUNT 18X 1/2SAF (NEEDLE) ×1
NEEDLE FILTER BLUNT 18X1 1/2 (NEEDLE) ×1 IMPLANT
PACK CATARACT BRASINGTON (MISCELLANEOUS) ×2 IMPLANT
PACK EYE AFTER SURG (MISCELLANEOUS) ×2 IMPLANT
PACK OPTHALMIC (MISCELLANEOUS) ×2 IMPLANT
RING MALYGIN 7.0 (MISCELLANEOUS) IMPLANT
SUT ETHILON 10-0 CS-B-6CS-B-6 (SUTURE)
SUT VICRYL  9 0 (SUTURE)
SUT VICRYL 9 0 (SUTURE) IMPLANT
SUTURE EHLN 10-0 CS-B-6CS-B-6 (SUTURE) IMPLANT
SYR 3ML LL SCALE MARK (SYRINGE) ×2 IMPLANT
SYR 5ML LL (SYRINGE) ×2 IMPLANT
SYR TB 1ML LUER SLIP (SYRINGE) ×2 IMPLANT
Symfony Toric IOL extended range IOL 22.5D/1.50 D (Intraocular Lens) ×2 IMPLANT
WATER STERILE IRR 500ML POUR (IV SOLUTION) ×2 IMPLANT
WIPE NON LINTING 3.25X3.25 (MISCELLANEOUS) ×2 IMPLANT

## 2017-09-02 NOTE — Anesthesia Procedure Notes (Signed)
Procedure Name: MAC Date/Time: 09/02/2017 9:25 AM Performed by: Janna Arch, CRNA Pre-anesthesia Checklist: Patient identified, Emergency Drugs available, Suction available and Patient being monitored Patient Re-evaluated:Patient Re-evaluated prior to induction Oxygen Delivery Method: Nasal cannula

## 2017-09-02 NOTE — Transfer of Care (Signed)
Immediate Anesthesia Transfer of Care Note  Patient: Desiree Grant  Procedure(s) Performed: CATARACT EXTRACTION PHACO AND INTRAOCULAR LENS PLACEMENT (IOC) RIGHT SYMFONY TORIC (Right Eye)  Patient Location: PACU  Anesthesia Type: MAC  Level of Consciousness: awake, alert  and patient cooperative  Airway and Oxygen Therapy: Patient Spontanous Breathing and Patient connected to supplemental oxygen  Post-op Assessment: Post-op Vital signs reviewed, Patient's Cardiovascular Status Stable, Respiratory Function Stable, Patent Airway and No signs of Nausea or vomiting  Post-op Vital Signs: Reviewed and stable  Complications: No apparent anesthesia complications

## 2017-09-02 NOTE — Anesthesia Postprocedure Evaluation (Signed)
Anesthesia Post Note  Patient: MAKINSEY PEPITONE  Procedure(s) Performed: CATARACT EXTRACTION PHACO AND INTRAOCULAR LENS PLACEMENT (IOC) RIGHT SYMFONY TORIC (Right Eye)  Patient location during evaluation: PACU Anesthesia Type: MAC Level of consciousness: awake and alert Pain management: pain level controlled Vital Signs Assessment: post-procedure vital signs reviewed and stable Respiratory status: spontaneous breathing, nonlabored ventilation, respiratory function stable and patient connected to nasal cannula oxygen Cardiovascular status: stable and blood pressure returned to baseline Postop Assessment: no apparent nausea or vomiting Anesthetic complications: no    Alisa Graff

## 2017-09-02 NOTE — Anesthesia Preprocedure Evaluation (Signed)
Anesthesia Evaluation  Patient identified by MRN, date of birth, ID band Patient awake    Reviewed: Allergy & Precautions, H&P , NPO status , Patient's Chart, lab work & pertinent test results, reviewed documented beta blocker date and time   Airway Mallampati: II  TM Distance: >3 FB Neck ROM: full    Dental no notable dental hx.    Pulmonary neg pulmonary ROS,    Pulmonary exam normal breath sounds clear to auscultation       Cardiovascular Exercise Tolerance: Good negative cardio ROS   Rhythm:regular Rate:Normal     Neuro/Psych Anxiety Depression negative neurological ROS     GI/Hepatic negative GI ROS, Neg liver ROS,   Endo/Other  negative endocrine ROS  Renal/GU negative Renal ROS  negative genitourinary   Musculoskeletal   Abdominal   Peds  Hematology negative hematology ROS (+)   Anesthesia Other Findings   Reproductive/Obstetrics negative OB ROS                             Anesthesia Physical Anesthesia Plan  ASA: II  Anesthesia Plan: MAC   Post-op Pain Management:    Induction:   PONV Risk Score and Plan:   Airway Management Planned:   Additional Equipment:   Intra-op Plan:   Post-operative Plan:   Informed Consent: I have reviewed the patients History and Physical, chart, labs and discussed the procedure including the risks, benefits and alternatives for the proposed anesthesia with the patient or authorized representative who has indicated his/her understanding and acceptance.   Dental Advisory Given  Plan Discussed with: CRNA  Anesthesia Plan Comments:         Anesthesia Quick Evaluation

## 2017-09-02 NOTE — H&P (Signed)
The History and Physical notes are on paper, have been signed, and are to be scanned. The patient remains stable and unchanged from the H&P.   Previous H&P reviewed, patient examined, and there are no changes.  Desiree Grant 09/02/2017 9:02 AM

## 2017-09-02 NOTE — Op Note (Signed)
LOCATION:  [facility]   PREOPERATIVE DIAGNOSIS:  Nuclear sclerotic cataract of the right eye.  H25.11   POSTOPERATIVE DIAGNOSIS:  Nuclear sclerotic cataract of the right eye.   PROCEDURE:  Phacoemulsification with Toric posterior chamber intraocular lens placement of the right eye.   LENS:   Implant Name Type Inv. Item Serial No. Manufacturer Lot No. LRB No. Used  Symfony Toric IOL extended range IOL 22.5D/1.50 D CYL Intraocular Lens  [id number] [facility]  Right 1     [id number] 22.5 Symfony Toric intraocular lens with 1.5 diopters of cylindrical power with axis orientation at 69 degrees.   ULTRASOUND TIME: 15 % of 0 minutes, 44 seconds.  CDE 6.3   SURGEON:  Wyonia Hough, MD   ANESTHESIA: Topical with tetracaine drops and 2% Xylocaine jelly, augmented with 1% preservative-free intracameral lidocaine. .   COMPLICATIONS:  None.   DESCRIPTION OF PROCEDURE:  The patient was identified in the holding room and transported to the operating suite and placed in the supine position under the operating microscope.  The right eye was identified as the operative eye, and it was prepped and draped in the usual sterile ophthalmic fashion.    A clear-corneal paracentesis incision was made at the 12:00 position.  0.5 ml of preservative-free 1% lidocaine was injected into the anterior chamber. The anterior chamber was filled with Viscoat.  A 2.4 millimeter near clear corneal incision was then made at the 9:00 position.  A cystotome and capsulorrhexis forceps were then used to make a curvilinear capsulorrhexis.  Hydrodissection and hydrodelineation were then performed using balanced salt solution.   Phacoemulsification was then used in stop and chop fashion to remove the lens, nucleus and epinucleus.  The remaining cortex was aspirated using the irrigation and aspiration handpiece.  Provisc viscoelastic was then placed into the capsular bag to distend it for lens placement.  The Verion  digital marker was used to align the implant at the intended axis.   A Toric lens was then injected into the capsular bag.  It was rotated clockwise until the axis marks on the lens were approximately 15 degrees in the counterclockwise direction to the intended alignment.  The viscoelastic was aspirated from the eye using the irrigation aspiration handpiece.  Then, a Koch spatula through the sideport incision was used to rotate the lens in a clockwise direction until the axis markings of the intraocular lens were lined up with the Verion alignment.  Balanced salt solution was then used to hydrate the wounds. Cefuroxime 0.1 ml of a 10mg /ml solution was injected into the anterior chamber for a dose of 1 mg of intracameral antibiotic at the completion of the case.    The eye was noted to have a physiologic pressure and there was no wound leak noted.   Timolol and Brimonidine drops were applied to the eye.  The patient was taken to the recovery room in stable condition having had no complications of anesthesia or surgery.  Rishikesh Khachatryan 09/02/2017, 9:47 AM

## 2017-09-03 ENCOUNTER — Encounter: Payer: Self-pay | Admitting: Ophthalmology

## 2017-09-05 LAB — PAP IG AND HPV HIGH-RISK: PAP Smear Comment: 0

## 2017-09-05 LAB — HPV, LOW VOLUME (REFLEX): HPV, LOW VOL REFLEX: NEGATIVE

## 2017-09-25 ENCOUNTER — Ambulatory Visit (INDEPENDENT_AMBULATORY_CARE_PROVIDER_SITE_OTHER): Payer: BLUE CROSS/BLUE SHIELD | Admitting: Internal Medicine

## 2017-09-25 ENCOUNTER — Encounter: Payer: Self-pay | Admitting: Internal Medicine

## 2017-09-25 VITALS — BP 121/81 | HR 74 | Temp 98.4°F | Resp 16 | Ht 64.0 in | Wt 157.0 lb

## 2017-09-25 DIAGNOSIS — J181 Lobar pneumonia, unspecified organism: Secondary | ICD-10-CM | POA: Diagnosis not present

## 2017-09-25 DIAGNOSIS — J189 Pneumonia, unspecified organism: Secondary | ICD-10-CM

## 2017-09-25 MED ORDER — DOXYCYCLINE HYCLATE 100 MG PO TABS
100.0000 mg | ORAL_TABLET | Freq: Two times a day (BID) | ORAL | 0 refills | Status: AC
Start: 2017-09-25 — End: 2017-10-05

## 2017-09-25 MED ORDER — GUAIFENESIN-CODEINE 100-10 MG/5ML PO SYRP: 5.0000 mL | ORAL_SOLUTION | Freq: Three times a day (TID) | ORAL | 0 refills | Status: DC | PRN

## 2017-09-25 NOTE — Progress Notes (Signed)
Date:  09/25/2017   Name:  Desiree Grant   DOB:  1955-06-22   MRN:  [id number]   Chief Complaint: Cough (Had fever x 2-3 days last week. Has had productive cough with headache and nasal drip. She has had some earache in L ear this week but no more fever denies NVD. )  Cough  This is a new problem. The current episode started in the past 7 days. The problem has been rapidly worsening. The problem occurs every few minutes. The cough is productive of sputum. Associated symptoms include chest pain, chills, a fever and headaches. Pertinent negatives include no sore throat, shortness of breath or wheezing. The symptoms are aggravated by exercise. She has tried OTC cough suppressant for the symptoms. The treatment provided no relief.      Review of Systems  Constitutional: Positive for chills and fever.  HENT: Negative for sore throat.   Respiratory: Positive for cough and chest tightness. Negative for shortness of breath and wheezing.   Cardiovascular: Positive for chest pain.  Gastrointestinal: Negative for abdominal pain, diarrhea, nausea and vomiting.  Neurological: Positive for headaches. Negative for dizziness.  Psychiatric/Behavioral: Positive for sleep disturbance.    Patient Active Problem List   Diagnosis Date Noted  . Shoulder strain, right, initial encounter 09/13/2016  . Neoplasm of uncertain behavior of skin 09/13/2016  . Muscle cramp, nocturnal 09/13/2016  . Anxiety and depression 03/22/2016  . Adenomatous colon polyp 02/07/2015  . Hyperlipidemia, mild 02/06/2015  . Fatigue 10/26/2014  . Fibrocystic breast changes 10/26/2014  . Depression, major, recurrent, moderate ([place]) 10/26/2014    Prior to Admission medications   Medication Sig Start Date End Date Taking? Authorizing Provider  busPIRone (BUSPAR) 10 MG tablet TAKE 1 TABLET(10 MG) BY MOUTH THREE TIMES DAILY 05/16/17  Yes Glean Hess, MD  cyclobenzaprine (FLEXERIL) 10 MG tablet Take 1 tablet (10 mg total) by  mouth at bedtime. 08/29/17  Yes Glean Hess, MD  Dextromethorphan-guaiFENesin [facility] DM PO) Take by mouth as needed.   Yes [provider]  escitalopram (LEXAPRO) 20 MG tablet Take 1 tablet (20 mg total) by mouth daily. 08/29/17  Yes Glean Hess, MD    Allergies  Allergen Reactions  . Niacin And Related Hives    blisters    Past Surgical History:  Procedure Laterality Date  . CATARACT EXTRACTION W/PHACO Right 09/02/2017   Procedure: CATARACT EXTRACTION PHACO AND INTRAOCULAR LENS PLACEMENT ([place]) RIGHT SYMFONY TORIC;  Surgeon: Leandrew Koyanagi, MD;  Location: [facility];  Service: Ophthalmology;  Laterality: Right;  . COLONOSCOPY W/ POLYPECTOMY  2012   tubular adenoma 0.5cm was removed from descending colon  . FACIAL COSMETIC SURGERY  2015  . PARTIAL HYSTERECTOMY     bleeding; cervix and ovary remains    Social History   Tobacco Use  . Smoking status: Never Smoker  . Smokeless tobacco: Never Used  Substance Use Topics  . Alcohol use: No    Alcohol/week: 0.0 oz  . Drug use: No     Medication list has been reviewed and updated.  PHQ 2/9 Scores 08/29/2017 02/08/2016  PHQ - 2 Score 5 0  PHQ- 9 Score 15 -    Physical Exam  Constitutional: She is oriented to person, place, and time. She appears well-developed. No distress.  HENT:  Head: Normocephalic and atraumatic.  Right Ear: Tympanic membrane and ear canal normal.  Left Ear: Tympanic membrane and ear canal normal.  Nose: Right sinus exhibits no maxillary  sinus tenderness and no frontal sinus tenderness. Left sinus exhibits no maxillary sinus tenderness and no frontal sinus tenderness.  Mouth/Throat: Oropharynx is clear and moist.  Neck: Normal range of motion. Neck supple.  Cardiovascular: Normal rate, regular rhythm and normal heart sounds.  Pulmonary/Chest: Effort normal. No accessory muscle usage. No respiratory distress. She has no wheezes. She has rhonchi in the left lower field.    Musculoskeletal: Normal range of motion.  Neurological: She is alert and oriented to person, place, and time.  Skin: Skin is warm and dry. No rash noted.  Psychiatric: She has a normal mood and affect. Her behavior is normal. Thought content normal.  Nursing note and vitals reviewed.   BP 121/81   Pulse 74   Temp 98.4 F (36.9 C) (Oral)   Resp 16   Ht 5\' 4"  (1.626 m)   Wt 157 lb (71.2 kg)   SpO2 96%   BMI 26.95 kg/m   Assessment and Plan: 1. Community acquired pneumonia of left lower lobe of lung ([place]) Rest, fluids, continue Mucinex Follow up if not improving - guaiFENesin-codeine (ROBITUSSIN AC) 100-10 MG/5ML syrup; Take 5 mLs by mouth 3 (three) times daily as needed for cough.  Dispense: 118 mL; Refill: 0 - doxycycline (VIBRA-TABS) 100 MG tablet; Take 1 tablet (100 mg total) by mouth 2 (two) times daily for 10 days.  Dispense: 20 tablet; Refill: 0   Meds ordered this encounter  Medications  . guaiFENesin-codeine (ROBITUSSIN AC) 100-10 MG/5ML syrup    Sig: Take 5 mLs by mouth 3 (three) times daily as needed for cough.    Dispense:  118 mL    Refill:  0  . doxycycline (VIBRA-TABS) 100 MG tablet    Sig: Take 1 tablet (100 mg total) by mouth 2 (two) times daily for 10 days.    Dispense:  20 tablet    Refill:  0    Partially dictated using Editor, commissioning. Any errors are unintentional.  Halina Maidens, MD [facility]  09/25/2017

## 2017-10-23 ENCOUNTER — Encounter: Payer: Self-pay | Admitting: *Deleted

## 2017-10-23 ENCOUNTER — Other Ambulatory Visit: Payer: Self-pay

## 2017-10-23 NOTE — Discharge Instructions (Signed)

## 2017-10-26 ENCOUNTER — Other Ambulatory Visit: Payer: Self-pay | Admitting: Internal Medicine

## 2017-10-26 DIAGNOSIS — F331 Major depressive disorder, recurrent, moderate: Secondary | ICD-10-CM

## 2017-10-30 ENCOUNTER — Ambulatory Visit: Payer: BLUE CROSS/BLUE SHIELD | Admitting: Anesthesiology

## 2017-10-30 ENCOUNTER — Ambulatory Visit
Admission: RE | Admit: 2017-10-30 | Discharge: 2017-10-30 | Disposition: A | Discharge status: Home / Self Care | Payer: BLUE CROSS/BLUE SHIELD | Source: Ambulatory Visit | Attending: Ophthalmology | Admitting: Ophthalmology

## 2017-10-30 ENCOUNTER — Encounter
Admission: RE | Disposition: A | Discharge status: Home / Self Care | Payer: Self-pay | Source: Ambulatory Visit | Attending: Ophthalmology

## 2017-10-30 DIAGNOSIS — H2512 Age-related nuclear cataract, left eye: Secondary | ICD-10-CM | POA: Insufficient documentation

## 2017-10-30 DIAGNOSIS — F329 Major depressive disorder, single episode, unspecified: Secondary | ICD-10-CM | POA: Diagnosis not present

## 2017-10-30 DIAGNOSIS — F419 Anxiety disorder, unspecified: Secondary | ICD-10-CM | POA: Insufficient documentation

## 2017-10-30 HISTORY — PX: CATARACT EXTRACTION W/PHACO: SHX586

## 2017-10-30 HISTORY — DX: Hyperlipidemia, unspecified: E78.5

## 2017-10-30 HISTORY — DX: Major depressive disorder, recurrent, moderate: F33.1

## 2017-10-30 SURGERY — PHACOEMULSIFICATION, CATARACT, WITH IOL INSERTION
Anesthesia: Monitor Anesthesia Care | Site: Eye | Laterality: Left | Wound class: "Clean "

## 2017-10-30 MED ORDER — LIDOCAINE HCL (PF) 2 % IJ SOLN
INTRAOCULAR | Status: DC | PRN
  Administered 2017-10-30: 1 mL

## 2017-10-30 MED ORDER — ARMC OPHTHALMIC DILATING DROPS
1.0000 "application " | OPHTHALMIC | Status: DC | PRN
  Administered 2017-10-30 (×3): 1 via OPHTHALMIC

## 2017-10-30 MED ORDER — EPINEPHRINE PF 1 MG/ML IJ SOLN
INTRAOCULAR | Status: DC | PRN
  Administered 2017-10-30: 46 mL via OPHTHALMIC

## 2017-10-30 MED ORDER — LACTATED RINGERS IV SOLN: 10.0000 mL/h | INTRAVENOUS | Status: DC

## 2017-10-30 MED ORDER — BRIMONIDINE TARTRATE-TIMOLOL 0.2-0.5 % OP SOLN
OPHTHALMIC | Status: DC | PRN
  Administered 2017-10-30: 1 [drp] via OPHTHALMIC

## 2017-10-30 MED ORDER — MIDAZOLAM HCL 2 MG/2ML IJ SOLN
INTRAMUSCULAR | Status: DC | PRN
  Administered 2017-10-30: 2 mg via INTRAVENOUS

## 2017-10-30 MED ORDER — CEFUROXIME OPHTHALMIC INJECTION 1 MG/0.1 ML
INJECTION | OPHTHALMIC | Status: DC | PRN
  Administered 2017-10-30: 0.1 mL via INTRACAMERAL

## 2017-10-30 MED ORDER — NA HYALUR & NA CHOND-NA HYALUR 0.4-0.35 ML IO KIT
PACK | INTRAOCULAR | Status: DC | PRN
  Administered 2017-10-30: 1 mL via INTRAOCULAR

## 2017-10-30 MED ORDER — ONDANSETRON HCL 4 MG/2ML IJ SOLN: 4.0000 mg | Freq: Once | INTRAMUSCULAR | Status: DC | PRN

## 2017-10-30 MED ORDER — FENTANYL CITRATE (PF) 100 MCG/2ML IJ SOLN
INTRAMUSCULAR | Status: DC | PRN
  Administered 2017-10-30 (×2): 50 ug via INTRAVENOUS

## 2017-10-30 MED ORDER — MOXIFLOXACIN HCL 0.5 % OP SOLN
1.0000 [drp] | OPHTHALMIC | Status: DC | PRN
  Administered 2017-10-30 (×3): 1 [drp] via OPHTHALMIC

## 2017-10-30 SURGICAL SUPPLY — 20 items
CANNULA ANT/CHMB 27G (MISCELLANEOUS) ×1 IMPLANT
CANNULA ANT/CHMB 27GA (MISCELLANEOUS) ×2 IMPLANT
Extended Range of Vision IOL (Intraocular Lens) ×1 IMPLANT
GLOVE SURG LX 7.5 STRW (GLOVE) ×1
GLOVE SURG LX STRL 7.5 STRW (GLOVE) ×1 IMPLANT
GLOVE SURG TRIUMPH 8.0 PF LTX (GLOVE) ×2 IMPLANT
GOWN STRL REUS W/ TWL LRG LVL3 (GOWN DISPOSABLE) ×2 IMPLANT
GOWN STRL REUS W/TWL LRG LVL3 (GOWN DISPOSABLE) ×2
MARKER SKIN DUAL TIP RULER LAB (MISCELLANEOUS) ×2 IMPLANT
NDL FILTER BLUNT 18X1 1/2 (NEEDLE) ×1 IMPLANT
NEEDLE FILTER BLUNT 18X 1/2SAF (NEEDLE) ×1
NEEDLE FILTER BLUNT 18X1 1/2 (NEEDLE) ×1 IMPLANT
PACK CATARACT BRASINGTON (MISCELLANEOUS) ×2 IMPLANT
PACK EYE AFTER SURG (MISCELLANEOUS) ×2 IMPLANT
PACK OPTHALMIC (MISCELLANEOUS) ×2 IMPLANT
SYR 3ML LL SCALE MARK (SYRINGE) ×2 IMPLANT
SYR 5ML LL (SYRINGE) ×2 IMPLANT
SYR TB 1ML LUER SLIP (SYRINGE) ×2 IMPLANT
WATER STERILE IRR 500ML POUR (IV SOLUTION) ×2 IMPLANT
WIPE NON LINTING 3.25X3.25 (MISCELLANEOUS) ×2 IMPLANT

## 2017-10-30 NOTE — Anesthesia Procedure Notes (Signed)
Procedure Name: MAC Performed by: Shantay Sonn, CRNA Pre-anesthesia Checklist: Patient identified, Emergency Drugs available, Suction available, Timeout performed and Patient being monitored Patient Re-evaluated:Patient Re-evaluated prior to induction Oxygen Delivery Method: Nasal cannula Placement Confirmation: positive ETCO2       

## 2017-10-30 NOTE — H&P (Signed)
The History and Physical notes are on paper, have been signed, and are to be scanned. The patient remains stable and unchanged from the H&P.   Previous H&P reviewed, patient examined, and there are no changes.  Desiree Grant 10/30/2017 8:45 AM

## 2017-10-30 NOTE — Op Note (Signed)
OPERATIVE NOTE  Desiree Grant [id number] 10/30/2017   PREOPERATIVE DIAGNOSIS:  Nuclear sclerotic cataract left eye. H25.12   POSTOPERATIVE DIAGNOSIS:    Nuclear sclerotic cataract left eye.     PROCEDURE:  Phacoemusification with posterior chamber intraocular lens placement of the left eye   LENS:   Implant Name Type Inv. Item Serial No. Manufacturer Lot No. LRB No. Used         1     [id number] 23.5 D Symfony IOL   ULTRASOUND TIME: 10  % of 0 minutes 25 seconds, CDE 2.4  SURGEON:  Wyonia Hough, MD   ANESTHESIA:  Topical with tetracaine drops and 2% Xylocaine jelly, augmented with 1% preservative-free intracameral lidocaine.    COMPLICATIONS:  None.   DESCRIPTION OF PROCEDURE:  The patient was identified in the holding room and transported to the operating room and placed in the supine position under the operating microscope.  The left eye was identified as the operative eye and it was prepped and draped in the usual sterile ophthalmic fashion.   A 1 millimeter clear-corneal paracentesis was made at the 1:30 position.  0.5 ml of preservative-free 1% lidocaine was injected into the anterior chamber.  The anterior chamber was filled with Viscoat viscoelastic.  A 2.4 millimeter keratome was used to make a near-clear corneal incision at the 10:30 position.  .  A curvilinear capsulorrhexis was made with a cystotome and capsulorrhexis forceps.  Balanced salt solution was used to hydrodissect and hydrodelineate the nucleus.   Phacoemulsification was then used in stop and chop fashion to remove the lens nucleus and epinucleus.  The remaining cortex was then removed using the irrigation and aspiration handpiece. Provisc was then placed into the capsular bag to distend it for lens placement.  A lens was then injected into the capsular bag.  The remaining viscoelastic was aspirated.   Wounds were hydrated with balanced salt solution.  The anterior chamber was inflated to a physiologic  pressure with balanced salt solution.  No wound leaks were noted. Cefuroxime 0.1 ml of a 10mg /ml solution was injected into the anterior chamber for a dose of 1 mg of intracameral antibiotic at the completion of the case.   Timolol and Brimonidine drops were applied to the eye.  The patient was taken to the recovery room in stable condition without complications of anesthesia or surgery.  Desiree Grant 10/30/2017, 9:31 AM

## 2017-10-30 NOTE — Anesthesia Postprocedure Evaluation (Signed)
Anesthesia Post Note  Patient: Desiree Grant  Procedure(s) Performed: CATARACT EXTRACTION PHACO AND INTRAOCULAR LENS PLACEMENT (IOC)  LEFT SYMFONY LENS (Left )  Patient location during evaluation: PACU Anesthesia Type: MAC Level of consciousness: awake and alert Pain management: pain level controlled Vital Signs Assessment: post-procedure vital signs reviewed and stable Respiratory status: spontaneous breathing, nonlabored ventilation, respiratory function stable and patient connected to nasal cannula oxygen Cardiovascular status: stable and blood pressure returned to baseline Postop Assessment: no apparent nausea or vomiting Anesthetic complications: no    Veda Canning

## 2017-10-30 NOTE — Transfer of Care (Signed)
Immediate Anesthesia Transfer of Care Note  Patient: Desiree Grant  Procedure(s) Performed: CATARACT EXTRACTION PHACO AND INTRAOCULAR LENS PLACEMENT (IOC)  LEFT SYMFONY LENS (Left )  Patient Location: PACU  Anesthesia Type: MAC  Level of Consciousness: awake, alert  and patient cooperative  Airway and Oxygen Therapy: Patient Spontanous Breathing and Patient connected to supplemental oxygen  Post-op Assessment: Post-op Vital signs reviewed, Patient's Cardiovascular Status Stable, Respiratory Function Stable, Patent Airway and No signs of Nausea or vomiting  Post-op Vital Signs: Reviewed and stable  Complications: No apparent anesthesia complications

## 2017-10-30 NOTE — Anesthesia Preprocedure Evaluation (Signed)
Anesthesia Evaluation  Patient identified by MRN, date of birth, ID band Patient awake    Reviewed: Allergy & Precautions, H&P , NPO status , Patient's Chart, lab work & pertinent test results, reviewed documented beta blocker date and time   Airway Mallampati: II  TM Distance: >3 FB Neck ROM: full    Dental no notable dental hx.    Pulmonary neg pulmonary ROS,    breath sounds clear to auscultation       Cardiovascular Exercise Tolerance: Good negative cardio ROS   Rhythm:regular Rate:Normal     Neuro/Psych Anxiety Depression negative neurological ROS     GI/Hepatic negative GI ROS, Neg liver ROS,   Endo/Other  negative endocrine ROS  Renal/GU negative Renal ROS  negative genitourinary   Musculoskeletal   Abdominal   Peds  Hematology negative hematology ROS (+)   Anesthesia Other Findings   Reproductive/Obstetrics negative OB ROS                             Anesthesia Physical  Anesthesia Plan  ASA: II  Anesthesia Plan: MAC   Post-op Pain Management:    Induction:   PONV Risk Score and Plan:   Airway Management Planned: Nasal Cannula  Additional Equipment:   Intra-op Plan:   Post-operative Plan:   Informed Consent: I have reviewed the patients History and Physical, chart, labs and discussed the procedure including the risks, benefits and alternatives for the proposed anesthesia with the patient or authorized representative who has indicated his/her understanding and acceptance.   Dental Advisory Given  Plan Discussed with: CRNA  Anesthesia Plan Comments:         Anesthesia Quick Evaluation

## 2017-10-31 ENCOUNTER — Encounter: Payer: Self-pay | Admitting: Ophthalmology

## 2017-11-08 ENCOUNTER — Other Ambulatory Visit: Payer: Self-pay | Admitting: Internal Medicine

## 2017-11-08 DIAGNOSIS — M5442 Lumbago with sciatica, left side: Principal | ICD-10-CM

## 2017-11-08 DIAGNOSIS — G8929 Other chronic pain: Secondary | ICD-10-CM

## 2017-12-25 ENCOUNTER — Other Ambulatory Visit: Payer: Self-pay | Admitting: Internal Medicine

## 2017-12-25 DIAGNOSIS — F32A Depression, unspecified: Secondary | ICD-10-CM

## 2017-12-25 DIAGNOSIS — F329 Major depressive disorder, single episode, unspecified: Secondary | ICD-10-CM

## 2017-12-25 DIAGNOSIS — F419 Anxiety disorder, unspecified: Principal | ICD-10-CM

## 2018-01-17 ENCOUNTER — Telehealth: Payer: Self-pay

## 2018-01-17 NOTE — Telephone Encounter (Signed)
Patient called stating she is having UTI symptoms and wanted to know if there is something she can try OTC first because she is leaving out of town for the weekend.  Told her she can try AZO OTC. If no better by Monday then to call and schedule appt to be seen. She verbalized understanding.

## 2018-01-21 ENCOUNTER — Other Ambulatory Visit: Payer: Self-pay | Admitting: Internal Medicine

## 2018-01-21 DIAGNOSIS — F331 Major depressive disorder, recurrent, moderate: Secondary | ICD-10-CM

## 2018-01-23 ENCOUNTER — Ambulatory Visit (INDEPENDENT_AMBULATORY_CARE_PROVIDER_SITE_OTHER): Payer: BLUE CROSS/BLUE SHIELD | Admitting: Internal Medicine

## 2018-01-23 ENCOUNTER — Encounter: Payer: Self-pay | Admitting: Internal Medicine

## 2018-01-23 VITALS — BP 118/70 | HR 72 | Temp 98.2°F | Ht 64.0 in | Wt 163.0 lb

## 2018-01-23 DIAGNOSIS — Z23 Encounter for immunization: Secondary | ICD-10-CM

## 2018-01-23 DIAGNOSIS — N3000 Acute cystitis without hematuria: Secondary | ICD-10-CM | POA: Diagnosis not present

## 2018-01-23 LAB — POCT URINALYSIS DIPSTICK
Bilirubin, UA: NEGATIVE
Glucose, UA: NEGATIVE
Ketones, UA: NEGATIVE
LEUKOCYTES UA: NEGATIVE
PH UA: 6 (ref 5.0–8.0)
PROTEIN UA: NEGATIVE
RBC UA: NEGATIVE
Spec Grav, UA: <=1.005 — AB (ref 1.010–1.025)
UROBILINOGEN UA: 0.2 U/dL

## 2018-01-23 MED ORDER — NITROFURANTOIN MONOHYD MACRO 100 MG PO CAPS: 100.0000 mg | ORAL_CAPSULE | Freq: Two times a day (BID) | ORAL | 0 refills | Status: AC

## 2018-01-23 NOTE — Progress Notes (Signed)
Date:  01/23/2018   Name:  Desiree Grant   DOB:  09-07-1955   MRN:  [id number]   Chief Complaint: vaginal pressure (Tried azo over the counter for two days. Vaginal pressure, and pain when coughing in pelvic area. No bleeding. Pain started last thursday.   )  Urinary Tract Infection   This is a new problem. The current episode started in the past 7 days. The problem has been gradually improving. The quality of the pain is described as aching and shooting. The pain is mild. There has been no fever. There is no history of pyelonephritis. Associated symptoms include frequency and urgency. Pertinent negatives include no chills or nausea. She has tried increased fluids for the symptoms. The treatment provided mild relief.    Review of Systems  Constitutional: Negative for chills, fatigue and fever.  HENT: Positive for postnasal drip. Negative for sinus pressure and trouble swallowing.   Eyes: Negative for visual disturbance.  Respiratory: Positive for cough. Negative for chest tightness, wheezing and stridor.   Cardiovascular: Negative for chest pain and palpitations.  Gastrointestinal: Positive for abdominal pain. Negative for constipation, diarrhea and nausea.  Genitourinary: Positive for dysuria, frequency and urgency. Negative for genital sores, vaginal bleeding and vaginal discharge.  Neurological: Negative for dizziness and headaches.    Patient Active Problem List   Diagnosis Date Noted  . Shoulder strain, right, initial encounter 09/13/2016  . Neoplasm of uncertain behavior of skin 09/13/2016  . Muscle cramp, nocturnal 09/13/2016  . Anxiety and depression 03/22/2016  . Adenomatous colon polyp 02/07/2015  . Hyperlipidemia, mild 02/06/2015  . Fatigue 10/26/2014  . Fibrocystic breast changes 10/26/2014  . Depression, major, recurrent, moderate ([place]) 10/26/2014    Allergies  Allergen Reactions  . Niacin And Related Hives    blisters    Past Surgical History:  Procedure  Laterality Date  . CATARACT EXTRACTION W/PHACO Right 09/02/2017   Procedure: CATARACT EXTRACTION PHACO AND INTRAOCULAR LENS PLACEMENT ([place]) RIGHT SYMFONY TORIC;  Surgeon: Leandrew Koyanagi, MD;  Location: [facility];  Service: Ophthalmology;  Laterality: Right;  . CATARACT EXTRACTION W/PHACO Left 10/30/2017   Procedure: CATARACT EXTRACTION PHACO AND INTRAOCULAR LENS PLACEMENT ([place])  LEFT SYMFONY LENS;  Surgeon: Leandrew Koyanagi, MD;  Location: [facility];  Service: Ophthalmology;  Laterality: Left;  . COLONOSCOPY W/ POLYPECTOMY  2012   tubular adenoma 0.5cm was removed from descending colon  . FACIAL COSMETIC SURGERY  2015  . PARTIAL HYSTERECTOMY     bleeding; cervix and ovary remains    Social History   Tobacco Use  . Smoking status: Never Smoker  . Smokeless tobacco: Never Used  Substance Use Topics  . Alcohol use: No    Alcohol/week: 0.0 standard drinks  . Drug use: No     Medication list has been reviewed and updated.  Current Meds  Medication Sig  . busPIRone (BUSPAR) 10 MG tablet TAKE 1 TABLET(10 MG) BY MOUTH THREE TIMES DAILY  . escitalopram (LEXAPRO) 20 MG tablet TAKE 1 TABLET(20 MG) BY MOUTH DAILY    PHQ 2/9 Scores 01/23/2018 08/29/2017 02/08/2016  PHQ - 2 Score 2 5 0  PHQ- 9 Score 7 15 -    Physical Exam  Constitutional: She appears well-developed and well-nourished.  Neck: Normal range of motion. Neck supple.  Cardiovascular: Normal rate, regular rhythm and normal heart sounds.  Pulmonary/Chest: Effort normal and breath sounds normal. No respiratory distress.  Abdominal: Soft. Bowel sounds are normal. There is tenderness in the suprapubic  area. There is no rebound, no guarding and no CVA tenderness.  Lymphadenopathy:    She has no cervical adenopathy.  Psychiatric: She has a normal mood and affect.  Nursing note and vitals reviewed.  Urine dipstick shows positive for leukocytes.  Micro exam: 3 WBC's per HPF.   BP 118/70 (BP Location:  Right Arm, Patient Position: Sitting, Cuff Size: Normal)   Pulse 72   Temp 98.2 F (36.8 C) (Oral)   Ht 5\' 4"  (1.626 m)   Wt 163 lb (73.9 kg)   SpO2 98%   BMI 27.98 kg/m   Assessment and Plan: 1. Acute cystitis without hematuria Continue increased water intake - nitrofurantoin, macrocrystal-monohydrate, (MACROBID) 100 MG capsule; Take 1 capsule (100 mg total) by mouth 2 (two) times daily for 3 days.  Dispense: 6 capsule; Refill: 0  2. Need for influenza vaccination - Flu Vaccine QUAD 36+ mos IM   Partially dictated using Editor, commissioning. Any errors are unintentional.  Halina Maidens, MD [facility]  01/23/2018

## 2018-03-05 ENCOUNTER — Other Ambulatory Visit: Payer: Self-pay | Admitting: Internal Medicine

## 2018-03-05 DIAGNOSIS — F331 Major depressive disorder, recurrent, moderate: Secondary | ICD-10-CM

## 2018-07-08 ENCOUNTER — Other Ambulatory Visit: Payer: Self-pay | Admitting: Internal Medicine

## 2018-07-08 DIAGNOSIS — F419 Anxiety disorder, unspecified: Principal | ICD-10-CM

## 2018-07-08 DIAGNOSIS — F329 Major depressive disorder, single episode, unspecified: Secondary | ICD-10-CM

## 2018-07-08 DIAGNOSIS — F32A Depression, unspecified: Secondary | ICD-10-CM

## 2018-09-01 ENCOUNTER — Encounter: Payer: BLUE CROSS/BLUE SHIELD | Admitting: Internal Medicine

## 2018-09-04 ENCOUNTER — Other Ambulatory Visit: Payer: Self-pay | Admitting: Internal Medicine

## 2018-09-04 DIAGNOSIS — F331 Major depressive disorder, recurrent, moderate: Secondary | ICD-10-CM

## 2018-09-10 ENCOUNTER — Other Ambulatory Visit: Payer: Self-pay

## 2018-09-10 ENCOUNTER — Encounter: Payer: Self-pay | Admitting: Internal Medicine

## 2018-09-10 ENCOUNTER — Ambulatory Visit (INDEPENDENT_AMBULATORY_CARE_PROVIDER_SITE_OTHER): Payer: BLUE CROSS/BLUE SHIELD | Admitting: Internal Medicine

## 2018-09-10 VITALS — BP 128/68 | HR 66 | Ht 64.0 in | Wt 169.2 lb

## 2018-09-10 DIAGNOSIS — Z0000 Encounter for general adult medical examination without abnormal findings: Secondary | ICD-10-CM

## 2018-09-10 DIAGNOSIS — K219 Gastro-esophageal reflux disease without esophagitis: Secondary | ICD-10-CM | POA: Diagnosis not present

## 2018-09-10 DIAGNOSIS — R059 Cough, unspecified: Secondary | ICD-10-CM

## 2018-09-10 DIAGNOSIS — F331 Major depressive disorder, recurrent, moderate: Secondary | ICD-10-CM | POA: Diagnosis not present

## 2018-09-10 DIAGNOSIS — R05 Cough: Secondary | ICD-10-CM | POA: Diagnosis not present

## 2018-09-10 DIAGNOSIS — Z1211 Encounter for screening for malignant neoplasm of colon: Secondary | ICD-10-CM

## 2018-09-10 LAB — POCT URINALYSIS DIPSTICK
Bilirubin, UA: NEGATIVE
Blood, UA: NEGATIVE
Glucose, UA: NEGATIVE
Ketones, UA: NEGATIVE
Leukocytes, UA: NEGATIVE
Nitrite, UA: NEGATIVE
Protein, UA: NEGATIVE
Spec Grav, UA: 1.02 (ref 1.010–1.025)
Urobilinogen, UA: 0.2 E.U./dL
pH, UA: 6 (ref 5.0–8.0)

## 2018-09-10 MED ORDER — DESVENLAFAXINE SUCCINATE ER 50 MG PO TB24: 50.0000 mg | ORAL_TABLET | Freq: Every day | ORAL | 1 refills | Status: DC

## 2018-09-10 MED ORDER — FAMOTIDINE 40 MG PO TABS: 40.0000 mg | ORAL_TABLET | Freq: Every day | ORAL | 0 refills | Status: DC

## 2018-09-10 NOTE — Patient Instructions (Addendum)
Take Pepcid once a day for reflux that may also be causing your cough.  Stop lexapro and begin Pristiq (desvenlafaxine)   Suicidal Feelings: How to Help Yourself Suicide is when you end your own life. There are many things you can do to help yourself feel better when struggling with these feelings. Many services and people are available to support you and others who struggle with similar feelings. If you ever feel like you may hurt yourself or others, or have thoughts about taking your own life, get help right away. To get help:  Call your local emergency services (911 in the [place]  Go to your nearest emergency department.  Call a suicide hotline to speak with a trained counselor. The following suicide hotlines are available in the [place] States: ? 1-800-273-TALK 760-777-0333). ? 1-800-SUICIDE 838-264-4523). ? 210-212-1854. This is a hotline for Spanish speakers. ? 509-846-2606. This is a hotline for TTY users. ? 1-866-4-U-TREVOR 727-803-9144). This is a hotline for lesbian, gay, bisexual, transgender, or questioning youth. ? For a list of hotlines in [place], visit ParkingAffiliatePrograms.se.html  Contact a crisis center or a local suicide prevention center. To find a crisis center or suicide prevention center: ? Call your local hospital, clinic, community service organization, mental health center, social service provider, or health department. Ask for help with connecting to a crisis center. ? For a list of crisis centers in the [place], visit: suicidepreventionlifeline.org ? For a list of crisis centers in [place], visit: suicideprevention.ca How to help yourself feel better   Promise yourself that you will not do anything extreme when you have suicidal feelings. Remember, there is hope. Many people have gotten through suicidal thoughts and feelings, and you can too. If you have had these feelings before, remind yourself that you  can get through them again.  Let family, friends, teachers, or counselors know how you are feeling. Try not to separate yourself from those who care about you and want to help you. Talk with someone every day, even if you do not feel sociable. Face-to-face conversation is best to help them understand your feelings.  Contact a mental health care provider and work with this person regularly.  Make a safety plan that you can follow during a crisis. Include phone numbers of suicide prevention hotlines, mental health professionals, and trusted friends and family members you can call during an emergency. Save these numbers on your phone.  If you are thinking of taking a lot of medicine, give your medicine to someone who can give it to you as prescribed. If you are on antidepressants and are concerned you will overdose, tell your health care provider so that he or she can give you safer medicines.  Try to stick to your routines. Follow a schedule every day. Make self-care a priority.  Make a list of realistic goals, and cross them off when you achieve them. Accomplishments can give you a sense of worth.  Wait until you are feeling better before doing things that you find difficult or unpleasant.  Do things that you have always enjoyed to take your mind off your feelings. Try reading a book, or listening to or playing music. Spending time outside, in nature, may help you feel better. Follow these instructions at home:   Visit your primary health care provider every year for a checkup.  Work with a mental health care provider as needed.  Eat a well-balanced diet, and eat regular meals.  Get plenty of rest.  Exercise if you are  able. Just 30 minutes of exercise each day can help you feel better.  Take over-the-counter and prescription medicines only as told by your health care provider. Ask your mental health care provider about the possible side effects of any medicines you are taking.  Do not  use alcohol or drugs, and remove these substances from your home.  Remove weapons, poisons, knives, and other deadly items from your home. General recommendations  Keep your living space well lit.  When you are feeling well, write yourself a letter with tips and support that you can read when you are not feeling well.  Remember that life's difficulties can be sorted out with help. Conditions can be treated, and you can learn behaviors and ways of thinking that will help you. Where to find more information  National Suicide Prevention Lifeline: www.suicidepreventionlifeline.org  Hopeline: www.hopeline.[place] for Suicide Prevention: PromotionalLoans.co.za  The [employer] (for lesbian, gay, bisexual, transgender, or questioning youth): www.thetrevorproject.org Contact a health care provider if:  You feel as though you are a burden to others.  You feel agitated, angry, vengeful, or have extreme mood swings.  You have withdrawn from family and friends. Get help right away if:  You are talking about suicide or wishing to die.  You start making plans for how to commit suicide.  You feel that you have no reason to live.  You start making plans for putting your affairs in order, saying goodbye, or giving your possessions away.  You feel guilt, shame, or unbearable pain, and it seems like there is no way out.  You are frequently using drugs or alcohol.  You are engaging in risky behaviors that could lead to death. If you have any of these symptoms, get help right away. Call emergency services, go to your nearest emergency department or crisis center, or call a suicide crisis helpline. Summary  Suicide is when you take your own life.  Promise yourself that you will not do anything extreme when you have suicidal feelings.  Let family, friends, teachers, or counselors know how you are feeling.  Get help right away if you feel as though life is getting too tough to  handle and you are thinking about suicide. This information is not intended to replace advice given to you by your health care provider. Make sure you discuss any questions you have with your health care provider. Document Released: 10/28/2002 Document Revised: 12/04/2016 Document Reviewed: 12/04/2016 Elsevier Interactive Patient Education  2019 [employer].

## 2018-09-10 NOTE — Progress Notes (Signed)
Date:  09/10/2018   Name:  Desiree Grant   DOB:  Apr 30, 1956   MRN:  [id number]   Chief Complaint: Annual Exam (Breast Exam. Pap in 4 more years. Patient refused to answer if she would harm herself or not. Wants to discuss changing depression medication PHQ9- 19 . GAD7- 3) Desiree Grant is a 63 y.o. female who presents today for her Complete Annual Exam. She feels poorly. She reports exercising occasionally doing yard work but no regular exercise. She reports she is sleeping fairly well.   Mammogram 01/2014 Colonoscopy 2013 - due 2018 Pap 08/2017   Anxiety  Presents for follow-up visit. Symptoms include decreased concentration. Patient reports no chest pain, dizziness, nervous/anxious behavior, palpitations or shortness of breath. Symptoms occur occasionally (anxiety is much better on Buspar.).    Depression         This is a chronic problem.  The problem occurs daily.The problem is unchanged.  Associated symptoms include decreased concentration, fatigue, hopelessness, decreased interest, appetite change and sad.  Associated symptoms include no headaches.  Past treatments include SSRIs - Selective serotonin reuptake inhibitors and other medications.  Compliance with treatment is good.  Previous treatment provided mild (she has no motivation to get up in the AM.  She has little interest in household activties but does make herself do some things.  She has been on Lexapro for many years.) relief.  Past medical history includes anxiety.   Cough  This is a chronic problem. The problem has been unchanged. The problem occurs every few hours. The cough is non-productive. Pertinent negatives include no chest pain, chills, fever, headaches, rash, shortness of breath or wheezing. The symptoms are aggravated by cold air and pollens. She has tried nothing for the symptoms.  Gastroesophageal Reflux  She complains of coughing. She reports no abdominal pain, no chest pain or no wheezing. This is a recurrent  problem. The current episode started more than 1 month ago. The problem has been unchanged. The symptoms are aggravated by caffeine. Associated symptoms include fatigue. She has tried nothing for the symptoms.    Review of Systems  Constitutional: Positive for appetite change and fatigue. Negative for chills and fever.  HENT: Negative for congestion, hearing loss, tinnitus, trouble swallowing and voice change.   Eyes: Negative for visual disturbance.  Respiratory: Positive for cough. Negative for chest tightness, shortness of breath and wheezing.   Cardiovascular: Negative for chest pain, palpitations and leg swelling.  Gastrointestinal: Negative for abdominal pain, constipation, diarrhea and vomiting.  Endocrine: Negative for polydipsia and polyuria.  Genitourinary: Negative for dysuria, frequency, genital sores, vaginal bleeding and vaginal discharge.  Musculoskeletal: Positive for back pain (muscular). Negative for arthralgias, gait problem and joint swelling.  Skin: Negative for color change and rash.  Neurological: Negative for dizziness, tremors, light-headedness and headaches.  Hematological: Negative for adenopathy. Does not bruise/bleed easily.  Psychiatric/Behavioral: Positive for decreased concentration, depression and dysphoric mood. Negative for sleep disturbance. The patient is not nervous/anxious.     Patient Active Problem List   Diagnosis Date Noted  . Shoulder strain, right, initial encounter 09/13/2016  . Neoplasm of uncertain behavior of skin 09/13/2016  . Muscle cramp, nocturnal 09/13/2016  . Anxiety and depression 03/22/2016  . Adenomatous colon polyp 02/07/2015  . Hyperlipidemia, mild 02/06/2015  . Fatigue 10/26/2014  . Fibrocystic breast changes 10/26/2014  . Depression, major, recurrent, moderate ([place]) 10/26/2014    Allergies  Allergen Reactions  . Niacin And Related Hives  blisters    Past Surgical History:  Procedure Laterality Date  . CATARACT  EXTRACTION W/PHACO Right 09/02/2017   Procedure: CATARACT EXTRACTION PHACO AND INTRAOCULAR LENS PLACEMENT ([place]) RIGHT SYMFONY TORIC;  Surgeon: Leandrew Koyanagi, MD;  Location: [facility];  Service: Ophthalmology;  Laterality: Right;  . CATARACT EXTRACTION W/PHACO Left 10/30/2017   Procedure: CATARACT EXTRACTION PHACO AND INTRAOCULAR LENS PLACEMENT ([place])  LEFT SYMFONY LENS;  Surgeon: Leandrew Koyanagi, MD;  Location: [facility];  Service: Ophthalmology;  Laterality: Left;  . COLONOSCOPY W/ POLYPECTOMY  2012   tubular adenoma 0.5cm was removed from descending colon  . FACIAL COSMETIC SURGERY  2015  . PARTIAL HYSTERECTOMY     bleeding; cervix and ovary remains    Social History   Tobacco Use  . Smoking status: Never Smoker  . Smokeless tobacco: Never Used  Substance Use Topics  . Alcohol use: No    Alcohol/week: 0.0 standard drinks  . Drug use: No     Medication list has been reviewed and updated.  Current Meds  Medication Sig  . busPIRone (BUSPAR) 10 MG tablet TAKE 1 TABLET(10 MG) BY MOUTH THREE TIMES DAILY  . escitalopram (LEXAPRO) 20 MG tablet TAKE 1 TABLET(20 MG) BY MOUTH DAILY    PHQ 2/9 Scores 09/10/2018 01/23/2018 08/29/2017 02/08/2016  PHQ - 2 Score 6 2 5  0  PHQ- 9 Score 19 7 15  -   GAD 7 : Generalized Anxiety Score 09/10/2018  Nervous, Anxious, on Edge 0  Control/stop worrying 0  Worry too much - different things 0  Trouble relaxing 0  Restless 0  Easily annoyed or irritable 0  Afraid - awful might happen 3  Total GAD 7 Score 3  Anxiety Difficulty Somewhat difficult    BP Readings from Last 3 Encounters:  09/10/18 128/68  01/23/18 118/70  10/30/17 128/73    Physical Exam Vitals signs and nursing note reviewed.  Constitutional:      General: She is not in acute distress.    Appearance: She is well-developed.  HENT:     Head: Normocephalic and atraumatic.     Right Ear: Tympanic membrane and ear canal normal.     Left Ear: Tympanic  membrane and ear canal normal.     Nose:     Right Sinus: No maxillary sinus tenderness.     Left Sinus: No maxillary sinus tenderness.     Mouth/Throat:     Pharynx: Uvula midline.  Eyes:     General: No scleral icterus.       Right eye: No discharge.        Left eye: No discharge.     Conjunctiva/sclera: Conjunctivae normal.  Neck:     Musculoskeletal: Normal range of motion. No erythema.     Thyroid: No thyromegaly.     Vascular: No carotid bruit.  Cardiovascular:     Rate and Rhythm: Normal rate and regular rhythm.     Pulses: Normal pulses.     Heart sounds: Normal heart sounds.  Pulmonary:     Effort: Pulmonary effort is normal. No respiratory distress.     Breath sounds: No wheezing.  Chest:     Breasts:        Right: No mass, nipple discharge, skin change or tenderness.        Left: No mass, nipple discharge, skin change or tenderness.  Abdominal:     General: Bowel sounds are normal.     Palpations: Abdomen is soft.  Tenderness: There is no abdominal tenderness.  Musculoskeletal: Normal range of motion.     Lumbar back: She exhibits no tenderness and no bony tenderness.       Back:  Lymphadenopathy:     Cervical: No cervical adenopathy.  Skin:    General: Skin is warm and dry.     Findings: No rash.  Neurological:     Mental Status: She is alert and oriented to person, place, and time.     Cranial Nerves: No cranial nerve deficit.     Sensory: No sensory deficit.     Deep Tendon Reflexes: Reflexes are normal and symmetric.  Psychiatric:        Attention and Perception: Attention normal.        Mood and Affect: Mood is depressed.        Speech: Speech normal.        Behavior: Behavior normal.        Thought Content: Thought content normal. Thought content does not include suicidal ideation. Thought content does not include suicidal plan.        Cognition and Memory: Cognition normal.        Judgment: Judgment normal.     Wt Readings from Last 3  Encounters:  09/10/18 169 lb 3.2 oz (76.7 kg)  01/23/18 163 lb (73.9 kg)  10/30/17 163 lb (73.9 kg)    BP 128/68   Pulse 66   Ht 5\' 4"  (1.626 m)   Wt 169 lb 3.2 oz (76.7 kg)   SpO2 96%   BMI 29.04 kg/m   Assessment and Plan: 1. Annual physical exam Normal exam except for mood Recommend regular exercise, core strengthening to help reduce back strain - CBC with Differential/Platelet - Comprehensive metabolic panel - Lipid panel - TSH - POCT urinalysis dipstick  2. Colon cancer screening Pt continues to decline colonoscopy even though she has hx of polyps - Fecal occult blood, imunochemical  3. Depression, major, recurrent, moderate ([place]) Stop lexapro and begin Pristiq Follow up in 6 weeks Continue Buspar - desvenlafaxine (PRISTIQ) 50 MG 24 hr tablet; Take 1 tablet (50 mg total) by mouth daily.  Dispense: 30 tablet; Refill: 1  4. Gastroesophageal reflux disease without esophagitis Begin pepcid daily - monitor cough - famotidine (PEPCID) 40 MG tablet; Take 1 tablet (40 mg total) by mouth daily.  Dispense: 30 tablet; Refill: 0  5. Cough May be 2/2 allergies/PND or GERD If no improvement with Pepcid, will recommend trial of claritin or allegra daily   Partially dictated using Editor, commissioning. Any errors are unintentional.  Halina Maidens, MD [facility]  09/10/2018

## 2018-09-11 LAB — CBC WITH DIFFERENTIAL/PLATELET
Basophils Absolute: 0.1 10*3/uL (ref 0.0–0.2)
Basos: 1 %
EOS (ABSOLUTE): 0.1 10*3/uL (ref 0.0–0.4)
Eos: 2 %
Hematocrit: 42 % (ref 34.0–46.6)
Hemoglobin: 13.9 g/dL (ref 11.1–15.9)
Immature Grans (Abs): 0 10*3/uL (ref 0.0–0.1)
Immature Granulocytes: 0 %
Lymphocytes Absolute: 1.2 10*3/uL (ref 0.7–3.1)
Lymphs: 28 %
MCH: 27.8 pg (ref 26.6–33.0)
MCHC: 33.1 g/dL (ref 31.5–35.7)
MCV: 84 fL (ref 79–97)
Monocytes Absolute: 0.3 10*3/uL (ref 0.1–0.9)
Monocytes: 8 %
Neutrophils Absolute: 2.5 10*3/uL (ref 1.4–7.0)
Neutrophils: 61 %
Platelets: 202 10*3/uL (ref 150–450)
RBC: 5 x10E6/uL (ref 3.77–5.28)
RDW: 12.4 % (ref 11.7–15.4)
WBC: 4.2 10*3/uL (ref 3.4–10.8)

## 2018-09-11 LAB — COMPREHENSIVE METABOLIC PANEL
ALT: 10 IU/L (ref 0–32)
AST: 13 IU/L (ref 0–40)
Albumin/Globulin Ratio: 2.9 — ABNORMAL HIGH (ref 1.2–2.2)
Albumin: 4.7 g/dL (ref 3.8–4.8)
Alkaline Phosphatase: 67 IU/L (ref 39–117)
BUN/Creatinine Ratio: 17 (ref 12–28)
BUN: 14 mg/dL (ref 8–27)
Bilirubin Total: 0.9 mg/dL (ref 0.0–1.2)
CO2: 23 mmol/L (ref 20–29)
Calcium: 9.5 mg/dL (ref 8.7–10.3)
Chloride: 107 mmol/L — ABNORMAL HIGH (ref 96–106)
Creatinine, Ser: 0.82 mg/dL (ref 0.57–1.00)
GFR calc Af Amer: 89 mL/min/{1.73_m2} (ref >59)
GFR calc non Af Amer: 77 mL/min/{1.73_m2} (ref >59)
Globulin, Total: 1.6 g/dL (ref 1.5–4.5)
Glucose: 99 mg/dL (ref 65–99)
Potassium: 4.2 mmol/L (ref 3.5–5.2)
Sodium: 145 mmol/L — ABNORMAL HIGH (ref 134–144)
Total Protein: 6.3 g/dL (ref 6.0–8.5)

## 2018-09-11 LAB — LIPID PANEL
Chol/HDL Ratio: 3.7 ratio (ref 0.0–4.4)
Cholesterol, Total: 257 mg/dL — ABNORMAL HIGH (ref 100–199)
HDL: 69 mg/dL (ref >39)
LDL Calculated: 172 mg/dL — ABNORMAL HIGH (ref 0–99)
Triglycerides: 79 mg/dL (ref 0–149)
VLDL Cholesterol Cal: 16 mg/dL (ref 5–40)

## 2018-09-11 LAB — TSH: TSH: 2.49 u[IU]/mL (ref 0.450–4.500)

## 2018-09-28 LAB — FECAL OCCULT BLOOD, IMMUNOCHEMICAL: Fecal Occult Bld: NEGATIVE

## 2018-09-28 LAB — SPECIMEN STATUS REPORT

## 2018-10-07 ENCOUNTER — Other Ambulatory Visit: Payer: Self-pay | Admitting: Internal Medicine

## 2018-10-07 DIAGNOSIS — K219 Gastro-esophageal reflux disease without esophagitis: Secondary | ICD-10-CM

## 2018-10-22 ENCOUNTER — Ambulatory Visit (INDEPENDENT_AMBULATORY_CARE_PROVIDER_SITE_OTHER): Payer: BLUE CROSS/BLUE SHIELD | Admitting: Internal Medicine

## 2018-10-22 ENCOUNTER — Encounter: Payer: Self-pay | Admitting: Internal Medicine

## 2018-10-22 ENCOUNTER — Other Ambulatory Visit: Payer: Self-pay

## 2018-10-22 VITALS — BP 118/70 | HR 86 | Temp 98.6°F | Ht 64.0 in | Wt 165.0 lb

## 2018-10-22 DIAGNOSIS — F331 Major depressive disorder, recurrent, moderate: Secondary | ICD-10-CM

## 2018-10-22 DIAGNOSIS — R059 Cough, unspecified: Secondary | ICD-10-CM

## 2018-10-22 DIAGNOSIS — R05 Cough: Secondary | ICD-10-CM | POA: Diagnosis not present

## 2018-10-22 MED ORDER — DESVENLAFAXINE SUCCINATE ER 100 MG PO TB24: 100.0000 mg | ORAL_TABLET | Freq: Every day | ORAL | 2 refills | Status: DC

## 2018-10-22 MED ORDER — BENZONATATE 100 MG PO CAPS: 100.0000 mg | ORAL_CAPSULE | Freq: Three times a day (TID) | ORAL | 0 refills | Status: DC

## 2018-10-22 NOTE — Patient Instructions (Signed)
Continue Pepcid  Add Claritin or Allegra daily

## 2018-10-22 NOTE — Progress Notes (Signed)
Date:  10/22/2018   Name:  Desiree Grant   DOB:  03/22/1956   MRN:  [id number]   Chief Complaint: Depression (6 week follow up. Waking up very emotional- crying. Last week was bad. This morning cried for 20 mins- gets very overwhelmed. ) and Cough (Dry cough- has coughing fits and when she does she coughs up clear phlgm.)  Cough This is a recurrent problem. The cough is non-productive. Pertinent negatives include no chest pain, chills, fever, shortness of breath or wheezing. Treatments tried: Pepcid - improved briefly.  Depression        This is a chronic problem.  Associated symptoms include decreased concentration, fatigue, appetite change and sad.  Associated symptoms include no suicidal ideas.( She is not sleeping as much but last week became very emotional and tearful over the smallest issues)     The symptoms are aggravated by family issues and social issues.  Past treatments include SNRIs - Serotonin and norepinephrine reuptake inhibitors (changed Lexapro to Pristiq last visit).  Compliance with treatment is good.   Review of Systems  Constitutional: Positive for appetite change and fatigue. Negative for chills and fever.  Respiratory: Positive for cough. Negative for chest tightness, shortness of breath and wheezing.   Cardiovascular: Negative for chest pain and palpitations.  Psychiatric/Behavioral: Positive for decreased concentration, depression, dysphoric mood and sleep disturbance. Negative for suicidal ideas. The patient is nervous/anxious.     Patient Active Problem List   Diagnosis Date Noted  . Gastroesophageal reflux disease without esophagitis 09/10/2018  . Cough 09/10/2018  . Shoulder strain, right, initial encounter 09/13/2016  . Neoplasm of uncertain behavior of skin 09/13/2016  . Muscle cramp, nocturnal 09/13/2016  . Anxiety and depression 03/22/2016  . Adenomatous colon polyp 02/07/2015  . Hyperlipidemia, mild 02/06/2015  . Fatigue 10/26/2014  . Fibrocystic  breast changes 10/26/2014  . Depression, major, recurrent, moderate ([place]) 10/26/2014    Allergies  Allergen Reactions  . Niacin And Related Hives    blisters    Past Surgical History:  Procedure Laterality Date  . CATARACT EXTRACTION W/PHACO Right 09/02/2017   Procedure: CATARACT EXTRACTION PHACO AND INTRAOCULAR LENS PLACEMENT ([place]) RIGHT SYMFONY TORIC;  Surgeon: Leandrew Koyanagi, MD;  Location: [place];  Service: Ophthalmology;  Laterality: Right;  . CATARACT EXTRACTION W/PHACO Left 10/30/2017   Procedure: CATARACT EXTRACTION PHACO AND INTRAOCULAR LENS PLACEMENT ([place])  LEFT SYMFONY LENS;  Surgeon: Leandrew Koyanagi, MD;  Location: [place];  Service: Ophthalmology;  Laterality: Left;  . COLONOSCOPY W/ POLYPECTOMY  2012   tubular adenoma 0.5cm was removed from descending colon  . FACIAL COSMETIC SURGERY  2015  . PARTIAL HYSTERECTOMY     bleeding; cervix and ovary remains    Social History   Tobacco Use  . Smoking status: Never Smoker  . Smokeless tobacco: Never Used  Substance Use Topics  . Alcohol use: No    Alcohol/week: 0.0 standard drinks  . Drug use: No     Medication list has been reviewed and updated.  Current Meds  Medication Sig  . busPIRone (BUSPAR) 10 MG tablet TAKE 1 TABLET(10 MG) BY MOUTH THREE TIMES DAILY (Patient taking differently: Take 10 mg by mouth 2 (two) times daily. )  . desvenlafaxine (PRISTIQ) 50 MG 24 hr tablet Take 1 tablet (50 mg total) by mouth daily.  . famotidine (PEPCID) 40 MG tablet TAKE 1 TABLET(40 MG) BY MOUTH DAILY    PHQ 2/9 Scores 10/22/2018 09/10/2018 01/23/2018 08/29/2017  PHQ -  2 Score 3 6 2 5   PHQ- 9 Score 13 19 7 15    GAD 7 : Generalized Anxiety Score 10/22/2018 09/10/2018  Nervous, Anxious, on Edge 3 0  Control/stop worrying 3 0  Worry too much - different things 3 0  Trouble relaxing 0 0  Restless 0 0  Easily annoyed or irritable 3 0  Afraid - awful might happen 3 3  Total GAD 7 Score 15 3   Anxiety Difficulty Somewhat difficult Somewhat difficult    BP Readings from Last 3 Encounters:  10/22/18 118/70  09/10/18 128/68  01/23/18 118/70    Physical Exam Vitals signs and nursing note reviewed.  Constitutional:      General: She is not in acute distress.    Appearance: She is well-developed.  HENT:     Head: Normocephalic and atraumatic.  Neck:     Musculoskeletal: Normal range of motion.  Cardiovascular:     Rate and Rhythm: Normal rate and regular rhythm.     Heart sounds: No murmur.  Pulmonary:     Effort: Pulmonary effort is normal. No respiratory distress.     Breath sounds: Normal breath sounds. No wheezing or rhonchi.  Musculoskeletal: Normal range of motion.     Right lower leg: No edema.     Left lower leg: No edema.  Lymphadenopathy:     Cervical: No cervical adenopathy.  Skin:    General: Skin is warm and dry.     Findings: No rash.  Neurological:     Mental Status: She is alert and oriented to person, place, and time.  Psychiatric:        Attention and Perception: Attention normal.        Mood and Affect: Affect is tearful.        Speech: Speech normal.        Behavior: Behavior normal.        Thought Content: Thought content normal.     Wt Readings from Last 3 Encounters:  10/22/18 165 lb (74.8 kg)  09/10/18 169 lb 3.2 oz (76.7 kg)  01/23/18 163 lb (73.9 kg)    BP 118/70   Pulse 86   Temp 98.6 F (37 C) (Oral)   Ht 5\' 4"  (1.626 m)   Wt 165 lb (74.8 kg)   SpO2 96%   BMI 28.32 kg/m   Assessment and Plan: 1. Depression, major, recurrent, moderate ([facility]) Increase Pristiq to 100 mg Continue Buspar 10 mg bid Consider Psych referral - desvenlafaxine (PRISTIQ) 100 MG 24 hr tablet; Take 1 tablet (100 mg total) by mouth daily.  Dispense: 30 tablet; Refill: 2  2. Cough Continue Pepcid since there was some improvement Add Allegra/Claritin Use tessalon at night to avoid interrupted sleep - benzonatate (TESSALON) 100 MG capsule; Take 1  capsule (100 mg total) by mouth 3 (three) times daily.  Dispense: 30 capsule; Refill: 0   Partially dictated using Editor, commissioning. Any errors are unintentional.  Halina Maidens, MD [facility]  10/22/2018

## 2018-11-03 ENCOUNTER — Other Ambulatory Visit: Payer: Self-pay | Admitting: Internal Medicine

## 2018-11-03 DIAGNOSIS — F331 Major depressive disorder, recurrent, moderate: Secondary | ICD-10-CM

## 2018-12-08 ENCOUNTER — Other Ambulatory Visit: Payer: Self-pay | Admitting: Internal Medicine

## 2018-12-08 DIAGNOSIS — G8929 Other chronic pain: Secondary | ICD-10-CM

## 2018-12-08 DIAGNOSIS — M5442 Lumbago with sciatica, left side: Secondary | ICD-10-CM

## 2018-12-16 ENCOUNTER — Other Ambulatory Visit: Payer: Self-pay

## 2018-12-16 ENCOUNTER — Encounter: Payer: Self-pay | Admitting: Internal Medicine

## 2018-12-16 ENCOUNTER — Ambulatory Visit
Admission: RE | Admit: 2018-12-16 | Discharge: 2018-12-16 | Disposition: A | Discharge status: Home / Self Care | Payer: BLUE CROSS/BLUE SHIELD | Attending: Internal Medicine | Admitting: Internal Medicine

## 2018-12-16 ENCOUNTER — Ambulatory Visit (INDEPENDENT_AMBULATORY_CARE_PROVIDER_SITE_OTHER): Payer: BLUE CROSS/BLUE SHIELD | Admitting: Internal Medicine

## 2018-12-16 ENCOUNTER — Ambulatory Visit
Admission: RE | Admit: 2018-12-16 | Discharge: 2018-12-16 | Disposition: A | Discharge status: Home / Self Care | Payer: BLUE CROSS/BLUE SHIELD | Source: Ambulatory Visit | Attending: Internal Medicine | Admitting: Internal Medicine

## 2018-12-16 VITALS — BP 132/84 | HR 74 | Ht 64.0 in | Wt 162.2 lb

## 2018-12-16 DIAGNOSIS — F331 Major depressive disorder, recurrent, moderate: Secondary | ICD-10-CM

## 2018-12-16 DIAGNOSIS — R059 Cough, unspecified: Secondary | ICD-10-CM

## 2018-12-16 DIAGNOSIS — R05 Cough: Secondary | ICD-10-CM

## 2018-12-16 DIAGNOSIS — T753XXA Motion sickness, initial encounter: Secondary | ICD-10-CM

## 2018-12-16 DIAGNOSIS — R0789 Other chest pain: Secondary | ICD-10-CM

## 2018-12-16 MED ORDER — SCOPOLAMINE 1 MG/3DAYS TD PT72: 1.0000 | MEDICATED_PATCH | TRANSDERMAL | 0 refills | Status: DC

## 2018-12-16 NOTE — Progress Notes (Signed)
Date:  12/16/2018   Name:  Desiree Grant   DOB:  1955/10/11   MRN:  [id number]   Chief Complaint: Depression (6 wk follow up. PHQ9- 5), Cough (Started 6 weeks ago. Tried pepcid, and still coughing. Thinking something else is going on. Annoying- clear production. Chest pain- but says " not my heart." More like muscle or stress.), and Travel Consult (Patient going to [place] on a boat and would like to request getting sea sick patches. )  Depression        This is a chronic problem.  The problem has been rapidly improving since onset.  Associated symptoms include suicidal ideas (are new since last visit).  Associated symptoms include no decreased concentration, no helplessness, no hopelessness, no decreased interest and no headaches.     The symptoms are aggravated by social issues.  Past treatments include SSRIs - Selective serotonin reuptake inhibitors.  Compliance with treatment is good.  Previous treatment provided significant relief. Cough This is a new problem. The current episode started more than 1 month ago. The problem has been unchanged. The problem occurs hourly. The cough is non-productive. Associated symptoms include chest pain (mild left sided muscular pain with movement), postnasal drip and wheezing. Pertinent negatives include no chills, fever, headaches, sore throat or shortness of breath. The symptoms are aggravated by lying down. Treatments tried: claritin and pepcid. The treatment provided no relief.  Chest Pain  This is a new problem. The current episode started 1 to 4 weeks ago. The onset quality is gradual. The problem occurs daily. The problem has been unchanged. The pain is present in the lateral region. The pain is mild. Quality: feels like an overworked muscle. The pain does not radiate. Associated symptoms include a cough. Pertinent negatives include no abdominal pain, back pain, diaphoresis, dizziness, exertional chest pressure, fever, headaches, irregular  heartbeat, malaise/fatigue, nausea, orthopnea, palpitations, shortness of breath, syncope or vomiting. The cough has no precipitants.  Sea sickness - she is going on a fishing trip soon and wants sea sickness patches.  The trip will be 2 or 3 days.  Review of Systems  Constitutional: Negative for chills, diaphoresis, fever and malaise/fatigue.  HENT: Positive for postnasal drip. Negative for sinus pressure, sore throat and trouble swallowing.   Eyes: Negative for visual disturbance.  Respiratory: Positive for cough and wheezing. Negative for choking, chest tightness and shortness of breath.   Cardiovascular: Positive for chest pain (mild left sided muscular pain with movement). Negative for palpitations, orthopnea, leg swelling and syncope.  Gastrointestinal: Negative for abdominal pain, nausea and vomiting.  Genitourinary: Negative for dysuria.  Musculoskeletal: Negative for arthralgias, back pain and gait problem.  Neurological: Negative for dizziness, syncope, light-headedness and headaches.  Psychiatric/Behavioral: Positive for depression, dysphoric mood and suicidal ideas (are new since last visit). Negative for decreased concentration. The patient is not nervous/anxious.     Patient Active Problem List   Diagnosis Date Noted  . Gastroesophageal reflux disease without esophagitis 09/10/2018  . Cough 09/10/2018  . Shoulder strain, right, initial encounter 09/13/2016  . Neoplasm of uncertain behavior of skin 09/13/2016  . Muscle cramp, nocturnal 09/13/2016  . Anxiety and depression 03/22/2016  . Adenomatous colon polyp 02/07/2015  . Hyperlipidemia, mild 02/06/2015  . Fatigue 10/26/2014  . Fibrocystic breast changes 10/26/2014  . Depression, major, recurrent, moderate ([place]) 10/26/2014    Allergies  Allergen Reactions  . Niacin And Related Hives    blisters    Past Surgical History:  Procedure Laterality Date  . CATARACT EXTRACTION W/PHACO Right 09/02/2017   Procedure:  CATARACT EXTRACTION PHACO AND INTRAOCULAR LENS PLACEMENT ([place]) RIGHT SYMFONY TORIC;  Surgeon: Leandrew Koyanagi, MD;  Location: [place];  Service: Ophthalmology;  Laterality: Right;  . CATARACT EXTRACTION W/PHACO Left 10/30/2017   Procedure: CATARACT EXTRACTION PHACO AND INTRAOCULAR LENS PLACEMENT ([place])  LEFT SYMFONY LENS;  Surgeon: Leandrew Koyanagi, MD;  Location: [place];  Service: Ophthalmology;  Laterality: Left;  . COLONOSCOPY W/ POLYPECTOMY  2012   tubular adenoma 0.5cm was removed from descending colon  . FACIAL COSMETIC SURGERY  2015  . PARTIAL HYSTERECTOMY     bleeding; cervix and ovary remains    Social History   Tobacco Use  . Smoking status: Never Smoker  . Smokeless tobacco: Never Used  Substance Use Topics  . Alcohol use: No    Alcohol/week: 0.0 standard drinks  . Drug use: No     Medication list has been reviewed and updated.  Current Meds  Medication Sig  . busPIRone (BUSPAR) 10 MG tablet TAKE 1 TABLET(10 MG) BY MOUTH THREE TIMES DAILY (Patient taking differently: Take 10 mg by mouth 2 (two) times daily. )  . cyclobenzaprine (FLEXERIL) 10 MG tablet Take 1 tablet (10 mg total) by mouth at bedtime as needed for muscle spasms.  Marland Kitchen desvenlafaxine (PRISTIQ) 100 MG 24 hr tablet Take 1 tablet (100 mg total) by mouth daily.  . famotidine (PEPCID) 40 MG tablet TAKE 1 TABLET(40 MG) BY MOUTH DAILY    PHQ 2/9 Scores 12/16/2018 10/22/2018 09/10/2018 01/23/2018  PHQ - 2 Score 0 3 6 2   PHQ- 9 Score 5 13 19 7     BP Readings from Last 3 Encounters:  12/16/18 132/84  10/22/18 118/70  09/10/18 128/68    Physical Exam Vitals signs and nursing note reviewed.  Constitutional:      General: She is not in acute distress.    Appearance: She is well-developed.  HENT:     Head: Normocephalic and atraumatic.  Neck:     Musculoskeletal: Normal range of motion.     Thyroid: No thyroid tenderness.  Cardiovascular:     Rate and Rhythm: Normal rate and  regular rhythm.  No extrasystoles are present.    Chest Wall: PMI is not displaced. No thrill.     Pulses: Normal pulses.     Heart sounds: Normal heart sounds. No murmur.  Pulmonary:     Effort: Pulmonary effort is normal. No respiratory distress.     Breath sounds: Normal breath sounds. No decreased breath sounds or wheezing.  Chest:    Musculoskeletal: Normal range of motion.  Lymphadenopathy:     Cervical: No cervical adenopathy.  Skin:    General: Skin is warm and dry.     Findings: No rash.  Neurological:     Mental Status: She is alert and oriented to person, place, and time.     Sensory: Sensation is intact.     Motor: Motor function is intact.  Psychiatric:        Attention and Perception: Attention and perception normal.        Mood and Affect: Mood and affect normal.        Speech: Speech normal.        Behavior: Behavior normal.        Thought Content: Thought content includes suicidal (admits to fleeting thoughts of being better off dead ) ideation. Thought content does not include suicidal plan.  Cognition and Memory: Cognition normal.        Judgment: Judgment normal.     Wt Readings from Last 3 Encounters:  12/16/18 162 lb 3.2 oz (73.6 kg)  10/22/18 165 lb (74.8 kg)  09/10/18 169 lb 3.2 oz (76.7 kg)    BP 132/84   Pulse 74   Ht 5\' 4"  (1.626 m)   Wt 162 lb 3.2 oz (73.6 kg)   SpO2 97%   BMI 27.84 kg/m   Assessment and Plan: 1. Depression, major, recurrent, moderate ([place]) Improving on current therapy - pt states she is much better and has no suicidal intent She wishes to continue Pristiq and Buspar  2. Cough Suspect adult onset asthma No response to trial of Pepcid No worrisome signs but need CXR to rule out pathology Sample Breo - one inhalation daily - call to report response to trial - DG Chest 2 View; Future  3. Sea sickness, initial encounter Place at least one hour prior to travel - scopolamine (TRANSDERM-SCOP) 1 MG/3DAYS; Place 1  patch (1.5 mg total) onto the skin every 3 (three) days.  Dispense: 4 patch; Refill: 0  4. Chest wall discomfort Suspect muscular pain related to cough CXR to rule out other pathology   Partially dictated using Dragon software. Any errors are unintentional.  Halina Maidens, MD [facility]  12/16/2018

## 2018-12-18 ENCOUNTER — Telehealth: Payer: Self-pay

## 2018-12-18 NOTE — Telephone Encounter (Signed)
Patient is wanting to know if she can get something strong for her cough. Told her Dr. Army Melia wants her to use her inhaler consistently everyday for one week. If no better call back in one week, can give a small amount of cough syrup to help and send to pulmonology for further eval.  Patient verbalized understanding of this.

## 2019-01-12 ENCOUNTER — Other Ambulatory Visit: Payer: Self-pay | Admitting: Internal Medicine

## 2019-01-12 DIAGNOSIS — F329 Major depressive disorder, single episode, unspecified: Secondary | ICD-10-CM

## 2019-01-12 DIAGNOSIS — F32A Depression, unspecified: Secondary | ICD-10-CM

## 2019-01-12 DIAGNOSIS — F419 Anxiety disorder, unspecified: Secondary | ICD-10-CM

## 2019-05-03 ENCOUNTER — Encounter: Payer: Self-pay | Admitting: Internal Medicine

## 2019-05-05 ENCOUNTER — Ambulatory Visit (INDEPENDENT_AMBULATORY_CARE_PROVIDER_SITE_OTHER): Payer: BLUE CROSS/BLUE SHIELD | Admitting: Internal Medicine

## 2019-05-05 ENCOUNTER — Encounter: Payer: Self-pay | Admitting: Internal Medicine

## 2019-05-05 VITALS — Temp 98.8°F | Resp 16 | Ht 64.0 in | Wt 162.0 lb

## 2019-05-05 DIAGNOSIS — Z20828 Contact with and (suspected) exposure to other viral communicable diseases: Secondary | ICD-10-CM | POA: Diagnosis not present

## 2019-05-05 DIAGNOSIS — R059 Cough, unspecified: Secondary | ICD-10-CM

## 2019-05-05 DIAGNOSIS — R05 Cough: Secondary | ICD-10-CM | POA: Diagnosis not present

## 2019-05-05 DIAGNOSIS — Z20822 Contact with and (suspected) exposure to covid-19: Secondary | ICD-10-CM

## 2019-05-05 NOTE — Progress Notes (Signed)
Date:  05/05/2019   Name:  Desiree Grant   DOB:  05-30-1955   MRN:  [id number]  I connected with this patient, Desiree Grant, by telephone at the patient's home.  I verified that I am speaking with the correct person using two identifiers. This visit was conducted via telephone due to the Covid-19 outbreak from my office at [facility] in [place], [place]. I discussed the limitations, risks, security and privacy concerns of performing an evaluation and management service by telephone. I also discussed with the patient that there may be a patient responsible charge related to this service. The patient expressed understanding and agreed to proceed.  Chief Complaint: Cough (headache, cough, ST...2 days symptoms...exposed to .daughter tested sunday with symptoms and was pos today. ) on 12/26 went to son's house for Christmas with friend.  Her son was sick but did not tell them.  He is not getting tested but his wife became symptomatic the next day and got tested for Covid and positive test came back today. Cough This is a new problem. The current episode started yesterday. The problem occurs hourly. The cough is productive of sputum. Associated symptoms include chills, headaches, postnasal drip and a sore throat. Pertinent negatives include no chest pain, shortness of breath or wheezing. Fever: 98.8. Treatments tried: Aleve and Advil for headache.    Lab Results  Component Value Date   CREATININE 0.82 09/10/2018   BUN 14 09/10/2018   NA 145 (H) 09/10/2018   K 4.2 09/10/2018   CL 107 (H) 09/10/2018   CO2 23 09/10/2018   Lab Results  Component Value Date   CHOL 257 (H) 09/10/2018   HDL 69 09/10/2018   LDLCALC 172 (H) 09/10/2018   TRIG 79 09/10/2018   CHOLHDL 3.7 09/10/2018   Lab Results  Component Value Date   TSH 2.490 09/10/2018   No results found for: HGBA1C   Review of Systems  Constitutional: Positive for chills. Negative for diaphoresis and fatigue. Fever: 98.8.  HENT:  Positive for postnasal drip and sore throat. Negative for congestion and hearing loss.        No loss of taste or smell  Respiratory: Positive for cough. Negative for chest tightness, shortness of breath and wheezing.   Cardiovascular: Negative for chest pain.  Gastrointestinal: Negative for diarrhea and nausea.  Neurological: Positive for headaches. Negative for dizziness and weakness.    Patient Active Problem List   Diagnosis Date Noted  . Gastroesophageal reflux disease without esophagitis 09/10/2018  . Cough 09/10/2018  . Shoulder strain, right, initial encounter 09/13/2016  . Neoplasm of uncertain behavior of skin 09/13/2016  . Muscle cramp, nocturnal 09/13/2016  . Anxiety and depression 03/22/2016  . Adenomatous colon polyp 02/07/2015  . Hyperlipidemia, mild 02/06/2015  . Fatigue 10/26/2014  . Fibrocystic breast changes 10/26/2014  . Depression, major, recurrent, moderate ([place]) 10/26/2014    Allergies  Allergen Reactions  . Niacin And Related Hives    blisters    Past Surgical History:  Procedure Laterality Date  . CATARACT EXTRACTION W/PHACO Right 09/02/2017   Procedure: CATARACT EXTRACTION PHACO AND INTRAOCULAR LENS PLACEMENT ([place]) RIGHT SYMFONY TORIC;  Surgeon: Leandrew Koyanagi, MD;  Location: [facility];  Service: Ophthalmology;  Laterality: Right;  . CATARACT EXTRACTION W/PHACO Left 10/30/2017   Procedure: CATARACT EXTRACTION PHACO AND INTRAOCULAR LENS PLACEMENT ([place])  LEFT SYMFONY LENS;  Surgeon: Leandrew Koyanagi, MD;  Location: [facility];  Service: Ophthalmology;  Laterality: Left;  . COLONOSCOPY W/  POLYPECTOMY  2012   tubular adenoma 0.5cm was removed from descending colon  . FACIAL COSMETIC SURGERY  2015  . PARTIAL HYSTERECTOMY     bleeding; cervix and ovary remains    Social History   Tobacco Use  . Smoking status: Never Smoker  . Smokeless tobacco: Never Used  Substance Use Topics  . Alcohol use: No    Alcohol/week: 0.0  standard drinks  . Drug use: No     Medication list has been reviewed and updated.  Current Meds  Medication Sig  . busPIRone (BUSPAR) 10 MG tablet TAKE 1 TABLET(10 MG) BY MOUTH THREE TIMES DAILY  . cyclobenzaprine (FLEXERIL) 10 MG tablet Take 1 tablet (10 mg total) by mouth at bedtime as needed for muscle spasms.  Marland Kitchen desvenlafaxine (PRISTIQ) 100 MG 24 hr tablet Take 1 tablet (100 mg total) by mouth daily.  . naproxen sodium (ALEVE) 220 MG tablet Take 220 mg by mouth.    PHQ 2/9 Scores 12/16/2018 10/22/2018 09/10/2018 01/23/2018  PHQ - 2 Score 0 3 6 2   PHQ- 9 Score 5 13 19 7     BP Readings from Last 3 Encounters:  12/16/18 132/84  10/22/18 118/70  09/10/18 128/68    Physical Exam Pulmonary:     Effort: Pulmonary effort is normal.     Comments: No dyspnea noted during the call. Occasional slight cough heard. Neurological:     Mental Status: She is alert.  Psychiatric:        Attention and Perception: Attention normal.        Mood and Affect: Mood normal.        Speech: Speech normal.        Cognition and Memory: Cognition normal.     Wt Readings from Last 3 Encounters:  05/05/19 162 lb (73.5 kg)  12/16/18 162 lb 3.2 oz (73.6 kg)  10/22/18 165 lb (74.8 kg)    Temp 98.8 F (37.1 C)   Resp 16   Ht 5\' 4"  (1.626 m)   Wt 162 lb (73.5 kg)   BMI 27.81 kg/m   Assessment and Plan: 1. Cough Take over the counter cough suppressants as needed Tylenol only for headache/fever Increase fluids Socially isolate until test results return  2. Exposure to COVID-19 virus Instructions to sign up for testing provided  I spent 8 minutes on this encounter. Partially dictated using Editor, commissioning. Any errors are unintentional.  Halina Maidens, MD [facility]  05/05/2019

## 2019-05-06 ENCOUNTER — Ambulatory Visit: Payer: BLUE CROSS/BLUE SHIELD | Attending: Internal Medicine

## 2019-05-06 DIAGNOSIS — Z20822 Contact with and (suspected) exposure to covid-19: Secondary | ICD-10-CM

## 2019-05-07 LAB — NOVEL CORONAVIRUS, NAA: SARS-CoV-2, NAA: NOT DETECTED

## 2019-05-14 ENCOUNTER — Encounter: Payer: Self-pay | Admitting: Internal Medicine

## 2019-05-27 ENCOUNTER — Other Ambulatory Visit: Payer: Self-pay | Admitting: Internal Medicine

## 2019-05-27 DIAGNOSIS — G8929 Other chronic pain: Secondary | ICD-10-CM

## 2019-07-21 ENCOUNTER — Other Ambulatory Visit: Payer: Self-pay | Admitting: Internal Medicine

## 2019-07-21 DIAGNOSIS — F331 Major depressive disorder, recurrent, moderate: Secondary | ICD-10-CM

## 2019-08-24 ENCOUNTER — Other Ambulatory Visit: Payer: Self-pay | Admitting: Internal Medicine

## 2019-08-24 DIAGNOSIS — F32A Depression, unspecified: Secondary | ICD-10-CM

## 2019-08-24 DIAGNOSIS — F329 Major depressive disorder, single episode, unspecified: Secondary | ICD-10-CM

## 2019-09-16 ENCOUNTER — Encounter: Payer: Self-pay | Admitting: Internal Medicine

## 2019-09-16 ENCOUNTER — Ambulatory Visit (INDEPENDENT_AMBULATORY_CARE_PROVIDER_SITE_OTHER): Payer: 59 | Admitting: Internal Medicine

## 2019-09-16 ENCOUNTER — Other Ambulatory Visit: Payer: Self-pay

## 2019-09-16 VITALS — BP 124/78 | HR 75 | Temp 97.2°F | Ht 64.0 in | Wt 153.0 lb

## 2019-09-16 DIAGNOSIS — E785 Hyperlipidemia, unspecified: Secondary | ICD-10-CM | POA: Diagnosis not present

## 2019-09-16 DIAGNOSIS — M545 Low back pain, unspecified: Secondary | ICD-10-CM

## 2019-09-16 DIAGNOSIS — Z1211 Encounter for screening for malignant neoplasm of colon: Secondary | ICD-10-CM

## 2019-09-16 DIAGNOSIS — F331 Major depressive disorder, recurrent, moderate: Secondary | ICD-10-CM | POA: Diagnosis not present

## 2019-09-16 DIAGNOSIS — Z0000 Encounter for general adult medical examination without abnormal findings: Secondary | ICD-10-CM

## 2019-09-16 DIAGNOSIS — Z1231 Encounter for screening mammogram for malignant neoplasm of breast: Secondary | ICD-10-CM | POA: Diagnosis not present

## 2019-09-16 LAB — POCT URINALYSIS DIPSTICK
Bilirubin, UA: NEGATIVE
Blood, UA: NEGATIVE
Glucose, UA: NEGATIVE
Ketones, UA: NEGATIVE
Leukocytes, UA: NEGATIVE
Nitrite, UA: NEGATIVE
Protein, UA: NEGATIVE
Spec Grav, UA: 1.015 (ref 1.010–1.025)
Urobilinogen, UA: 0.2 E.U./dL
pH, UA: 5 (ref 5.0–8.0)

## 2019-09-16 MED ORDER — PREDNISONE 10 MG PO TABS: ORAL_TABLET | ORAL | 0 refills | Status: AC

## 2019-09-16 MED ORDER — ARIPIPRAZOLE 5 MG PO TABS: 5.0000 mg | ORAL_TABLET | Freq: Every day | ORAL | 1 refills | Status: DC

## 2019-09-16 NOTE — Progress Notes (Signed)
Date:  09/16/2019   Name:  Desiree Grant   DOB:  1955-09-16   MRN:  [id number]   Chief Complaint: Annual Exam, Depression (Feels like meds are not helping like they did before. PHQ9- 17, GAD7- 7), and Back Pain (Flare up for 4 weeks. Wants to discuss trying prednisone to help relieve pain.) Desiree Grant is a 64 y.o. female who presents today for her Complete Annual Exam. She feels poorly. She reports exercising none. She reports she is sleeping poorly.  Mammogram - none Pap  08/2017 Colonoscopy - FIT 09/2018 Immunization History  Administered Date(s) Administered  . Influenza,inj,Quad PF,6+ Mos 02/07/2015, 02/08/2016, 01/23/2018  . Janssen (J&J) SARS-COV-2 Vaccination 08/06/2019    Depression        This is a chronic problem.  The problem has been gradually worsening since onset.  Associated symptoms include irritable, restlessness, decreased interest and sad.  Associated symptoms include no fatigue, no headaches and no suicidal ideas.  Past treatments include SNRIs - Serotonin and norepinephrine reuptake inhibitors and other medications (does not think that buspar is beneficial).  Compliance with treatment is good.  Previous treatment provided moderate relief. Back Pain This is a recurrent problem. The current episode started more than 1 year ago. The pain is present in the lumbar spine. The quality of the pain is described as aching. Pertinent negatives include no abdominal pain, bowel incontinence, chest pain, dysuria, fever or headaches. She has tried ice, heat, chiropractic manipulation and muscle relaxant for the symptoms.    Lab Results  Component Value Date   CREATININE 0.82 09/10/2018   BUN 14 09/10/2018   NA 145 (H) 09/10/2018   K 4.2 09/10/2018   CL 107 (H) 09/10/2018   CO2 23 09/10/2018   Lab Results  Component Value Date   CHOL 257 (H) 09/10/2018   HDL 69 09/10/2018   LDLCALC 172 (H) 09/10/2018   TRIG 79 09/10/2018   CHOLHDL 3.7 09/10/2018   Lab Results    Component Value Date   TSH 2.490 09/10/2018   No results found for: HGBA1C Lab Results  Component Value Date   WBC 4.2 09/10/2018   HGB 13.9 09/10/2018   HCT 42.0 09/10/2018   MCV 84 09/10/2018   PLT 202 09/10/2018   Lab Results  Component Value Date   ALT 10 09/10/2018   AST 13 09/10/2018   ALKPHOS 67 09/10/2018   BILITOT 0.9 09/10/2018     Review of Systems  Constitutional: Negative for chills, fatigue and fever.  HENT: Negative for congestion, hearing loss, tinnitus, trouble swallowing and voice change.   Eyes: Negative for visual disturbance.  Respiratory: Negative for cough, chest tightness, shortness of breath and wheezing.   Cardiovascular: Negative for chest pain, palpitations and leg swelling.  Gastrointestinal: Negative for abdominal pain, bowel incontinence, constipation, diarrhea and vomiting.  Endocrine: Negative for polydipsia and polyuria.  Genitourinary: Negative for dysuria, frequency, genital sores, vaginal bleeding and vaginal discharge.  Musculoskeletal: Positive for back pain. Negative for arthralgias, gait problem and joint swelling.  Skin: Negative for color change and rash.  Neurological: Negative for dizziness, tremors, light-headedness and headaches.  Hematological: Negative for adenopathy. Does not bruise/bleed easily.  Psychiatric/Behavioral: Positive for depression and dysphoric mood. Negative for sleep disturbance and suicidal ideas. The patient is nervous/anxious.     Patient Active Problem List   Diagnosis Date Noted  . Gastroesophageal reflux disease without esophagitis 09/10/2018  . Neoplasm of uncertain behavior of skin 09/13/2016  . Muscle  cramp, nocturnal 09/13/2016  . Anxiety and depression 03/22/2016  . Adenomatous colon polyp 02/07/2015  . Hyperlipidemia, mild 02/06/2015  . Fatigue 10/26/2014  . Fibrocystic breast changes 10/26/2014  . Depression, major, recurrent, moderate ([place]) 10/26/2014    Allergies  Allergen Reactions   . Niacin And Related Hives    blisters    Past Surgical History:  Procedure Laterality Date  . CATARACT EXTRACTION W/PHACO Right 09/02/2017   Procedure: CATARACT EXTRACTION PHACO AND INTRAOCULAR LENS PLACEMENT ([place]) RIGHT SYMFONY TORIC;  Surgeon: Leandrew Koyanagi, MD;  Location: [place];  Service: Ophthalmology;  Laterality: Right;  . CATARACT EXTRACTION W/PHACO Left 10/30/2017   Procedure: CATARACT EXTRACTION PHACO AND INTRAOCULAR LENS PLACEMENT ([place])  LEFT SYMFONY LENS;  Surgeon: Leandrew Koyanagi, MD;  Location: [place];  Service: Ophthalmology;  Laterality: Left;  . COLONOSCOPY W/ POLYPECTOMY  2012   tubular adenoma 0.5cm was removed from descending colon  . FACIAL COSMETIC SURGERY  2015  . PARTIAL HYSTERECTOMY     bleeding; cervix and ovary remains    Social History   Tobacco Use  . Smoking status: Never Smoker  . Smokeless tobacco: Never Used  Substance Use Topics  . Alcohol use: No    Alcohol/week: 0.0 standard drinks  . Drug use: No     Medication list has been reviewed and updated.  Current Meds  Medication Sig  . busPIRone (BUSPAR) 10 MG tablet TAKE 1 TABLET(10 MG) BY MOUTH THREE TIMES DAILY  . cyclobenzaprine (FLEXERIL) 10 MG tablet TAKE 1 TABLET(10 MG) BY MOUTH AT BEDTIME AS NEEDED FOR MUSCLE SPASMS  . desvenlafaxine (PRISTIQ) 100 MG 24 hr tablet TAKE 1 TABLET(100 MG) BY MOUTH DAILY  . naproxen sodium (ALEVE) 220 MG tablet Take 220 mg by mouth.    PHQ 2/9 Scores 09/16/2019 12/16/2018 10/22/2018 09/10/2018  PHQ - 2 Score 6 0 3 6  PHQ- 9 Score 17 5 13 19    GAD 7 : Generalized Anxiety Score 09/16/2019 10/22/2018 09/10/2018  Nervous, Anxious, on Edge 0 3 0  Control/stop worrying 0 3 0  Worry too much - different things 0 3 0  Trouble relaxing 2 0 0  Restless 2 0 0  Easily annoyed or irritable 3 3 0  Afraid - awful might happen 0 3 3  Total GAD 7 Score 7 15 3   Anxiety Difficulty Not difficult at all Somewhat difficult Somewhat difficult      BP Readings from Last 3 Encounters:  09/16/19 124/78  12/16/18 132/84  10/22/18 118/70    Physical Exam Vitals and nursing note reviewed.  Constitutional:      General: She is irritable. She is not in acute distress.    Appearance: She is well-developed.  HENT:     Head: Normocephalic and atraumatic.     Right Ear: Tympanic membrane and ear canal normal.     Left Ear: Tympanic membrane and ear canal normal.     Nose:     Right Sinus: No maxillary sinus tenderness.     Left Sinus: No maxillary sinus tenderness.  Eyes:     General: No scleral icterus.       Right eye: No discharge.        Left eye: No discharge.     Conjunctiva/sclera: Conjunctivae normal.  Neck:     Thyroid: No thyromegaly.     Vascular: No carotid bruit.  Cardiovascular:     Rate and Rhythm: Normal rate and regular rhythm.     Pulses: Normal pulses.  Heart sounds: Normal heart sounds.  Pulmonary:     Effort: Pulmonary effort is normal. No respiratory distress.     Breath sounds: No wheezing.  Chest:     Breasts:        Right: No mass, nipple discharge, skin change or tenderness.        Left: No mass, nipple discharge, skin change or tenderness.  Abdominal:     General: Bowel sounds are normal.     Palpations: Abdomen is soft.     Tenderness: There is no abdominal tenderness.  Musculoskeletal:        General: Normal range of motion.     Cervical back: Normal range of motion. No erythema.     Right lower leg: No edema.     Left lower leg: No edema.  Lymphadenopathy:     Cervical: No cervical adenopathy.  Skin:    General: Skin is warm and dry.     Findings: No rash.  Neurological:     Mental Status: She is alert and oriented to person, place, and time.     Cranial Nerves: No cranial nerve deficit.     Sensory: No sensory deficit.     Deep Tendon Reflexes: Reflexes are normal and symmetric.  Psychiatric:        Attention and Perception: Attention normal.        Mood and Affect: Mood  is depressed. Affect is tearful.        Speech: Speech normal.        Behavior: Behavior normal.        Thought Content: Thought content includes suicidal (has fleeting thoughts of being better off dead) ideation. Thought content does not include suicidal plan.        Cognition and Memory: Cognition normal.     Wt Readings from Last 3 Encounters:  09/16/19 153 lb (69.4 kg)  05/05/19 162 lb (73.5 kg)  12/16/18 162 lb 3.2 oz (73.6 kg)    BP 124/78   Pulse 75   Temp (!) 97.2 F (36.2 C) (Temporal)   Ht 5\' 4"  (1.626 m)   Wt 153 lb (69.4 kg)   SpO2 100%   BMI 26.26 kg/m   Assessment and Plan: 1. Annual physical exam Recommend healthy diet; regular exercise Want to postpone mammogram at this time - CBC with Differential/Platelet - Comprehensive metabolic panel  2. Depression, major, recurrent, moderate (HCC) Severe ongoing depression despite Pristiq and Buspar She has good insight into her situation Some fleeting suicidal thoughts but no plan or intent Recommend Psych referral but pt is hesitant so will add Abilify and follow up closely - TSH - ARIPiprazole (ABILIFY) 5 MG tablet; Take 1 tablet (5 mg total) by mouth daily.  Dispense: 30 tablet; Refill: 1  3. Hyperlipidemia, mild - Lipid panel  4. Colon cancer screening - Fecal occult blood, imunochemical  5. Encounter for screening mammogram for breast cancer Will not order at this time since she just had Covid vaccine Will discuss at follow up  6. Lumbar back pain Continue heat, chiropractic care Steroid taper - hold nsiads while takine - predniSONE (DELTASONE) 10 MG tablet; Take 6 on day 1and 2, 5 on day 3 and 4, 4 on day 5 and 6 , 3 on day 7 and 8, 2 on day 9 and 10 and 1 on day 11 and 12 then stop.  Dispense: 42 tablet; Refill: 0   Partially dictated using [place], [employer]. Any errors are unintentional.  Halina Maidens,  MD [facility]  09/16/2019

## 2019-09-16 NOTE — Patient Instructions (Signed)
You can stop Buspar  Do not take Aleve or Advil while taking Prednisone taper

## 2019-09-17 LAB — COMPREHENSIVE METABOLIC PANEL
ALT: 11 IU/L (ref 0–32)
AST: 16 IU/L (ref 0–40)
Albumin/Globulin Ratio: 2.3 — ABNORMAL HIGH (ref 1.2–2.2)
Albumin: 4.8 g/dL (ref 3.8–4.8)
Alkaline Phosphatase: 79 IU/L (ref 39–117)
BUN/Creatinine Ratio: 16 (ref 12–28)
BUN: 14 mg/dL (ref 8–27)
Bilirubin Total: 0.8 mg/dL (ref 0.0–1.2)
CO2: 25 mmol/L (ref 20–29)
Calcium: 9.6 mg/dL (ref 8.7–10.3)
Chloride: 103 mmol/L (ref 96–106)
Creatinine, Ser: 0.88 mg/dL (ref 0.57–1.00)
GFR calc Af Amer: 81 mL/min/{1.73_m2} (ref >59)
GFR calc non Af Amer: 71 mL/min/{1.73_m2} (ref >59)
Globulin, Total: 2.1 g/dL (ref 1.5–4.5)
Glucose: 96 mg/dL (ref 65–99)
Potassium: 4.1 mmol/L (ref 3.5–5.2)
Sodium: 141 mmol/L (ref 134–144)
Total Protein: 6.9 g/dL (ref 6.0–8.5)

## 2019-09-17 LAB — CBC WITH DIFFERENTIAL/PLATELET
Basophils Absolute: 0.1 10*3/uL (ref 0.0–0.2)
Basos: 2 %
EOS (ABSOLUTE): 0.1 10*3/uL (ref 0.0–0.4)
Eos: 2 %
Hematocrit: 44.4 % (ref 34.0–46.6)
Hemoglobin: 14.5 g/dL (ref 11.1–15.9)
Immature Grans (Abs): 0 10*3/uL (ref 0.0–0.1)
Immature Granulocytes: 0 %
Lymphocytes Absolute: 1.1 10*3/uL (ref 0.7–3.1)
Lymphs: 28 %
MCH: 28.2 pg (ref 26.6–33.0)
MCHC: 32.7 g/dL (ref 31.5–35.7)
MCV: 86 fL (ref 79–97)
Monocytes Absolute: 0.3 10*3/uL (ref 0.1–0.9)
Monocytes: 8 %
Neutrophils Absolute: 2.4 10*3/uL (ref 1.4–7.0)
Neutrophils: 60 %
Platelets: 198 10*3/uL (ref 150–450)
RBC: 5.15 x10E6/uL (ref 3.77–5.28)
RDW: 12.1 % (ref 11.7–15.4)
WBC: 4 10*3/uL (ref 3.4–10.8)

## 2019-09-17 LAB — LIPID PANEL
Chol/HDL Ratio: 3.4 ratio (ref 0.0–4.4)
Cholesterol, Total: 251 mg/dL — ABNORMAL HIGH (ref 100–199)
HDL: 73 mg/dL (ref >39)
LDL Chol Calc (NIH): 164 mg/dL — ABNORMAL HIGH (ref 0–99)
Triglycerides: 84 mg/dL (ref 0–149)
VLDL Cholesterol Cal: 14 mg/dL (ref 5–40)

## 2019-09-17 LAB — TSH: TSH: 1.66 u[IU]/mL (ref 0.450–4.500)

## 2019-09-24 ENCOUNTER — Other Ambulatory Visit: Payer: Self-pay | Admitting: Internal Medicine

## 2019-09-24 DIAGNOSIS — M545 Low back pain, unspecified: Secondary | ICD-10-CM

## 2019-09-28 ENCOUNTER — Other Ambulatory Visit: Payer: Self-pay | Admitting: Internal Medicine

## 2019-09-28 DIAGNOSIS — F331 Major depressive disorder, recurrent, moderate: Secondary | ICD-10-CM

## 2019-09-28 NOTE — Telephone Encounter (Signed)
Patient called. Pt states that she did contact the pharmacy and was told that refill was not available. Prescription noted to be sent in on 07/21/19 #30 with 5 refills.  [facility] pharmacy contacted and spoke with pharmacy tech. Pharmacy states that refill is available for pick up.  Patient called, no answer at this time. Left message advising patient that medication was available for pick up at pharmacy.

## 2019-09-28 NOTE — Telephone Encounter (Signed)
Pt called stating that she is completely out of this medication. Please advise.  °

## 2019-10-02 LAB — FECAL OCCULT BLOOD, IMMUNOCHEMICAL: Fecal Occult Bld: NEGATIVE

## 2019-10-02 LAB — SPECIMEN STATUS REPORT

## 2019-10-10 ENCOUNTER — Other Ambulatory Visit: Payer: Self-pay | Admitting: Internal Medicine

## 2019-10-10 DIAGNOSIS — G8929 Other chronic pain: Secondary | ICD-10-CM

## 2019-10-10 NOTE — Telephone Encounter (Signed)
Requested medication (s) are due for refill today: yes  Requested medication (s) are on the active medication list: yes  Last refill:  05/27/19  Future visit scheduled: yes  Notes to clinic:  Med not delegated to NT to refill   Requested Prescriptions  Pending Prescriptions Disp Refills   cyclobenzaprine (FLEXERIL) 10 MG tablet [Pharmacy Med Name: CYCLOBENZAPRINE 10MG  TABLETS] 30 tablet 0    Sig: TAKE 1 TABLET(10 MG) BY MOUTH AT BEDTIME AS NEEDED FOR MUSCLE SPASMS      Not Delegated - Analgesics:  Muscle Relaxants Failed - 10/10/2019  6:27 PM      Failed - This refill cannot be delegated      Passed - Valid encounter within last 6 months    Recent Outpatient Visits           3 weeks ago Annual physical exam   [facility] Glean Hess, MD   5 months ago Cough   [facility] Glean Hess, MD   9 months ago Depression, major, recurrent, moderate [facility])   [facility] Glean Hess, MD   11 months ago Cough   [facility] Glean Hess, MD   1 year ago Annual physical exam   [facility] Glean Hess, MD       Future Appointments             In 3 weeks Army Melia Jesse Sans, MD [facility], [place]   In 11 months Army Melia Jesse Sans, MD [facility], [facility]

## 2019-10-16 ENCOUNTER — Telehealth: Payer: Self-pay | Admitting: *Deleted

## 2019-10-16 NOTE — Telephone Encounter (Signed)
Pharmacy is calling to report:  Patient was written Rx for Flexeril 10 mg on 6/7. There was an error in filling- patient received Flexeril 5 mg and took 4 tablets before this was corrected. Pharmacy is notifying office of this error. [facility]- Pharmacist- [facility]/[place]

## 2019-10-19 NOTE — Telephone Encounter (Signed)
Please Advise.  KP

## 2019-10-19 NOTE — Telephone Encounter (Signed)
Nothing needs to be done if she now has the correct Rx.

## 2019-11-02 ENCOUNTER — Other Ambulatory Visit: Payer: Self-pay

## 2019-11-02 ENCOUNTER — Encounter: Payer: Self-pay | Admitting: Internal Medicine

## 2019-11-02 ENCOUNTER — Ambulatory Visit (INDEPENDENT_AMBULATORY_CARE_PROVIDER_SITE_OTHER): Payer: 59 | Admitting: Internal Medicine

## 2019-11-02 VITALS — BP 126/78 | HR 67 | Temp 98.4°F | Ht 64.0 in | Wt 158.0 lb

## 2019-11-02 DIAGNOSIS — M5136 Other intervertebral disc degeneration, lumbar region: Secondary | ICD-10-CM | POA: Diagnosis not present

## 2019-11-02 DIAGNOSIS — F331 Major depressive disorder, recurrent, moderate: Secondary | ICD-10-CM

## 2019-11-02 DIAGNOSIS — M25561 Pain in right knee: Secondary | ICD-10-CM | POA: Diagnosis not present

## 2019-11-02 DIAGNOSIS — D485 Neoplasm of uncertain behavior of skin: Secondary | ICD-10-CM

## 2019-11-02 DIAGNOSIS — M51369 Other intervertebral disc degeneration, lumbar region without mention of lumbar back pain or lower extremity pain: Secondary | ICD-10-CM | POA: Insufficient documentation

## 2019-11-02 DIAGNOSIS — G8929 Other chronic pain: Secondary | ICD-10-CM

## 2019-11-02 MED ORDER — ARIPIPRAZOLE 5 MG PO TABS
5.0000 mg | ORAL_TABLET | Freq: Every day | ORAL | 3 refills | Status: DC
Start: 2019-11-02 — End: 2020-02-08

## 2019-11-02 NOTE — Progress Notes (Signed)
Date:  11/02/2019   Name:  Desiree Grant   DOB:  Jul 22, 1955   MRN:  [id number]   Chief Complaint: Depression (Follow up. Order Mammo. ), Back Pain (Seeing a chiropracter. Sometimes getting out of bed " feels like back can break."), Knee Pain (Right knee pain. Been wearing a brace. Feels like knee is going to collapse and cave in. ), and Nevus (Wants looked at and sent to dermatology for removal if needed. )  Depression        This is a chronic problem.  The problem has been gradually improving since onset.  Associated symptoms include fatigue, irritable and decreased interest.  Associated symptoms include no myalgias, no headaches and no suicidal ideas.     The symptoms are aggravated by social issues.  Past treatments include SNRIs - Serotonin and norepinephrine reuptake inhibitors and other medications (Abilify added last visit).  Compliance with treatment is good.  Previous treatment provided moderate relief. Knee Pain  There was no injury mechanism. The pain is present in the right knee. The pain is moderate. The pain has been worsening since onset. Pertinent negatives include no numbness. Associated symptoms comments: Feels unstable. Treatments tried: brace. The treatment provided moderate relief.  Back Pain This is a chronic problem. The problem occurs daily. The problem has been gradually worsening since onset. The pain is present in the lumbar spine. The pain radiates to the left knee. The pain is moderate. The symptoms are aggravated by bending, twisting and sitting. Stiffness is present in the morning. Pertinent negatives include no bladder incontinence, bowel incontinence, chest pain, headaches, numbness or weakness. She has tried chiropractic manipulation for the symptoms. The treatment provided mild (temporary relief) relief.  Nevus - wants a mole checked and get a dermatology referral.  Large lesion on right lower abdomen.  Lab Results  Component Value Date   CREATININE 0.88  09/16/2019   BUN 14 09/16/2019   NA 141 09/16/2019   K 4.1 09/16/2019   CL 103 09/16/2019   CO2 25 09/16/2019   Lab Results  Component Value Date   CHOL 251 (H) 09/16/2019   HDL 73 09/16/2019   LDLCALC 164 (H) 09/16/2019   TRIG 84 09/16/2019   CHOLHDL 3.4 09/16/2019   Lab Results  Component Value Date   TSH 1.660 09/16/2019   No results found for: HGBA1C Lab Results  Component Value Date   WBC 4.0 09/16/2019   HGB 14.5 09/16/2019   HCT 44.4 09/16/2019   MCV 86 09/16/2019   PLT 198 09/16/2019   Lab Results  Component Value Date   ALT 11 09/16/2019   AST 16 09/16/2019   ALKPHOS 79 09/16/2019   BILITOT 0.8 09/16/2019     Review of Systems  Constitutional: Positive for fatigue. Negative for chills and unexpected weight change.  Respiratory: Negative for cough, chest tightness and shortness of breath.   Cardiovascular: Negative for chest pain and leg swelling.  Gastrointestinal: Negative for bowel incontinence.  Genitourinary: Negative for bladder incontinence.  Musculoskeletal: Positive for back pain. Negative for gait problem, joint swelling and myalgias.  Skin:       Skin lesion  Neurological: Negative for dizziness, weakness, numbness and headaches.  Psychiatric/Behavioral: Positive for depression and dysphoric mood. Negative for behavioral problems, sleep disturbance and suicidal ideas. The patient is nervous/anxious.     Patient Active Problem List   Diagnosis Date Noted  . Gastroesophageal reflux disease without esophagitis 09/10/2018  . Neoplasm of uncertain behavior of  skin 09/13/2016  . Muscle cramp, nocturnal 09/13/2016  . Anxiety and depression 03/22/2016  . Adenomatous colon polyp 02/07/2015  . Hyperlipidemia, mild 02/06/2015  . Fatigue 10/26/2014  . Fibrocystic breast changes 10/26/2014  . Depression, major, recurrent, moderate ([place]) 10/26/2014    Allergies  Allergen Reactions  . Niacin And Related Hives    blisters    Past Surgical  History:  Procedure Laterality Date  . CATARACT EXTRACTION W/PHACO Right 09/02/2017   Procedure: CATARACT EXTRACTION PHACO AND INTRAOCULAR LENS PLACEMENT ([place]) RIGHT SYMFONY TORIC;  Surgeon: Leandrew Koyanagi, MD;  Location: [place];  Service: Ophthalmology;  Laterality: Right;  . CATARACT EXTRACTION W/PHACO Left 10/30/2017   Procedure: CATARACT EXTRACTION PHACO AND INTRAOCULAR LENS PLACEMENT ([place])  LEFT SYMFONY LENS;  Surgeon: Leandrew Koyanagi, MD;  Location: [place];  Service: Ophthalmology;  Laterality: Left;  . COLONOSCOPY W/ POLYPECTOMY  2012   tubular adenoma 0.5cm was removed from descending colon  . FACIAL COSMETIC SURGERY  2015  . PARTIAL HYSTERECTOMY     bleeding; cervix and ovary remains    Social History   Tobacco Use  . Smoking status: Never Smoker  . Smokeless tobacco: Never Used  Vaping Use  . Vaping Use: Never used  Substance Use Topics  . Alcohol use: No    Alcohol/week: 0.0 standard drinks  . Drug use: No     Medication list has been reviewed and updated.  Current Meds  Medication Sig  . ARIPiprazole (ABILIFY) 5 MG tablet Take 1 tablet (5 mg total) by mouth daily.  . cyclobenzaprine (FLEXERIL) 10 MG tablet TAKE 1 TABLET(10 MG) BY MOUTH AT BEDTIME AS NEEDED FOR MUSCLE SPASMS  . desvenlafaxine (PRISTIQ) 100 MG 24 hr tablet TAKE 1 TABLET(100 MG) BY MOUTH DAILY  . naproxen sodium (ALEVE) 220 MG tablet Take 220 mg by mouth.    PHQ 2/9 Scores 11/02/2019 09/16/2019 12/16/2018 10/22/2018  PHQ - 2 Score 2 6 0 3  PHQ- 9 Score 10 17 5 13     GAD 7 : Generalized Anxiety Score 11/02/2019 09/16/2019 10/22/2018 09/10/2018  Nervous, Anxious, on Edge 2 0 3 0  Control/stop worrying 0 0 3 0  Worry too much - different things 0 0 3 0  Trouble relaxing 0 2 0 0  Restless 0 2 0 0  Easily annoyed or irritable 2 3 3  0  Afraid - awful might happen 0 0 3 3  Total GAD 7 Score 4 7 15 3   Anxiety Difficulty Not difficult at all Not difficult at all Somewhat  difficult Somewhat difficult    BP Readings from Last 3 Encounters:  11/02/19 126/78  09/16/19 124/78  12/16/18 132/84    Physical Exam Vitals and nursing note reviewed.  Constitutional:      General: She is irritable. She is not in acute distress.    Appearance: Normal appearance. She is well-developed.  HENT:     Head: Normocephalic and atraumatic.  Cardiovascular:     Rate and Rhythm: Normal rate and regular rhythm.  Pulmonary:     Effort: Pulmonary effort is normal. No respiratory distress.     Breath sounds: No wheezing or rhonchi.  Musculoskeletal:     Cervical back: Normal range of motion.     Lumbar back: Tenderness and bony tenderness present. Negative right straight leg raise test and negative left straight leg raise test.     Right hip: Normal.     Left hip: Normal.     Right knee: Effusion present.  No deformity or erythema.     Left knee: Normal. No deformity, effusion or erythema.     Right lower leg: No edema.     Left lower leg: No edema.  Lymphadenopathy:     Cervical: No cervical adenopathy.  Skin:    General: Skin is warm and dry.     Findings: No rash.  Neurological:     Mental Status: She is alert and oriented to person, place, and time.  Psychiatric:        Attention and Perception: Attention and perception normal.        Mood and Affect: Mood is depressed. Mood is not anxious.        Speech: Speech normal.        Behavior: Behavior normal.     Wt Readings from Last 3 Encounters:  11/02/19 158 lb (71.7 kg)  09/16/19 153 lb (69.4 kg)  05/05/19 162 lb (73.5 kg)    BP 126/78 (BP Location: Right Arm, Patient Position: Sitting, Cuff Size: Normal)   Pulse 67   Temp 98.4 F (36.9 C) (Oral)   Ht 5\' 4"  (1.626 m)   Wt 158 lb (71.7 kg)   SpO2 98%   BMI 27.12 kg/m   Assessment and Plan: 1. Depression, major, recurrent, moderate (HCC) Depressive symptoms are improved from last visit. She is more irritable but this may be due to worsening back  pain. Modest weight gain noted - counter measures discussed Pt does not want Psych referral at this time. - ARIPiprazole (ABILIFY) 5 MG tablet; Take 1 tablet (5 mg total) by mouth daily.  Dispense: 30 tablet; Refill: 3  2. Neoplasm of uncertain behavior of skin Appears to a AK. She has evidence of sun damage in exposed areas so skin survey is recommended - Ambulatory referral to Dermatology  3. Chronic pain of right knee Small effusion noted Recommend Aleve bid; soft brace for support  4. Lumbar degenerative disc disease Having more severe chronic pain with evidence of DDD on previous Xray Continue Aleve bid - Ambulatory referral to Orthopedic Surgery   Partially dictated using [place]. Any errors are unintentional.  Halina Maidens, MD [facility]  11/02/2019

## 2020-01-01 ENCOUNTER — Other Ambulatory Visit: Payer: Self-pay | Admitting: Internal Medicine

## 2020-01-01 DIAGNOSIS — G8929 Other chronic pain: Secondary | ICD-10-CM

## 2020-01-19 ENCOUNTER — Other Ambulatory Visit: Payer: Self-pay | Admitting: Internal Medicine

## 2020-01-19 DIAGNOSIS — F331 Major depressive disorder, recurrent, moderate: Secondary | ICD-10-CM

## 2020-01-19 NOTE — Telephone Encounter (Signed)
Requested Prescriptions  Pending Prescriptions Disp Refills  . desvenlafaxine (PRISTIQ) 100 MG 24 hr tablet [Pharmacy Med Name: DESVENLAFAXINE ER SUCCINATE 100MG  T] 90 tablet 1    Sig: TAKE 1 TABLET(100 MG) BY MOUTH DAILY     Psychiatry: Antidepressants - SNRI - desvenlafaxine & venlafaxine Failed - 01/19/2020  3:32 AM      Failed - LDL in normal range and within 360 days    LDL Chol Calc (NIH)  Date Value Ref Range Status  09/16/2019 164 (H) 0 - 99 mg/dL Final         Failed - Total Cholesterol in normal range and within 360 days    Cholesterol, Total  Date Value Ref Range Status  09/16/2019 251 (H) 100 - 199 mg/dL Final         Passed - Triglycerides in normal range and within 360 days    Triglycerides  Date Value Ref Range Status  09/16/2019 84 0 - 149 mg/dL Final         Passed - Completed PHQ-2 or PHQ-9 in the last 360 days.      Passed - Last BP in normal range    BP Readings from Last 1 Encounters:  11/02/19 126/78         Passed - Valid encounter within last 6 months    Recent Outpatient Visits          2 months ago Depression, major, recurrent, moderate ([place])   [facility] Glean Hess, MD   4 months ago Annual physical exam   [facility] Glean Hess, MD   8 months ago Cough   [facility] Glean Hess, MD   1 year ago Depression, major, recurrent, moderate [facility])   [facility] Glean Hess, MD   1 year ago Cough   [facility] Glean Hess, MD      Future Appointments            In 2 weeks Army Melia Jesse Sans, MD [facility], [place]   In 8 months Army Melia Jesse Sans, MD [facility], [facility]

## 2020-01-27 ENCOUNTER — Other Ambulatory Visit: Payer: Self-pay | Admitting: Internal Medicine

## 2020-01-27 DIAGNOSIS — F331 Major depressive disorder, recurrent, moderate: Secondary | ICD-10-CM

## 2020-01-27 NOTE — Telephone Encounter (Signed)
Requested medications are due for refill today? Yes - This medication refill cannot be delegated.    Requested medications are on active medication list? Yes  Last Refill:   11/02/2019  # 30 with 3 refills  Future visit scheduled?  Yes in one week   Notes to Clinic:  This medication refill cannot be delegated.

## 2020-02-08 ENCOUNTER — Encounter: Payer: Self-pay | Admitting: Internal Medicine

## 2020-02-08 ENCOUNTER — Other Ambulatory Visit: Payer: Self-pay

## 2020-02-08 ENCOUNTER — Ambulatory Visit (INDEPENDENT_AMBULATORY_CARE_PROVIDER_SITE_OTHER): Payer: 59 | Admitting: Internal Medicine

## 2020-02-08 VITALS — BP 116/72 | HR 79 | Ht 64.0 in | Wt 167.8 lb

## 2020-02-08 DIAGNOSIS — Z5189 Encounter for other specified aftercare: Secondary | ICD-10-CM

## 2020-02-08 DIAGNOSIS — Z23 Encounter for immunization: Secondary | ICD-10-CM

## 2020-02-08 DIAGNOSIS — R058 Other specified cough: Secondary | ICD-10-CM | POA: Diagnosis not present

## 2020-02-08 DIAGNOSIS — G8929 Other chronic pain: Secondary | ICD-10-CM

## 2020-02-08 DIAGNOSIS — F331 Major depressive disorder, recurrent, moderate: Secondary | ICD-10-CM | POA: Diagnosis not present

## 2020-02-08 DIAGNOSIS — M5442 Lumbago with sciatica, left side: Secondary | ICD-10-CM

## 2020-02-08 MED ORDER — MELOXICAM 15 MG PO TABS: 15.0000 mg | ORAL_TABLET | Freq: Every day | ORAL | 5 refills | Status: DC | PRN

## 2020-02-08 MED ORDER — LIOTHYRONINE SODIUM 5 MCG PO TABS: 5.0000 ug | ORAL_TABLET | Freq: Every day | ORAL | 0 refills | Status: DC

## 2020-02-08 MED ORDER — CYCLOBENZAPRINE HCL 10 MG PO TABS: 10.0000 mg | ORAL_TABLET | Freq: Every evening | ORAL | 5 refills | Status: DC | PRN

## 2020-02-08 NOTE — Progress Notes (Signed)
Date:  02/08/2020   Name:  Desiree Grant   DOB:  1956/03/17   MRN:  [id number]   Chief Complaint: Depression (Follow up. ), skin biopsy (Biopsy last tuesday of left butt cheek and its sore and thinks she has infection.), and Flu Vaccine (Reg dose flu shot.)  Depression        This is a chronic problem.  The problem has been waxing and waning (currently worse) since onset.  Associated symptoms include fatigue, hopelessness, insomnia, decreased interest, appetite change and suicidal ideas.  Associated symptoms include no headaches.  Past treatments include SNRIs - Serotonin and norepinephrine reuptake inhibitors and other medications (Pristiq and Abilify).  Compliance with treatment is good.  Previous treatment provided mild relief. Back Pain This is a recurrent (seen by Ortho - OA and tendonitis but no HNP or other worrisome factors) problem. The problem has been gradually improving since onset. The pain is present in the lumbar spine. The pain does not radiate. Pertinent negatives include no chest pain or headaches. She has tried muscle relaxant and NSAIDs for the symptoms. The treatment provided moderate relief.  Cough This is a recurrent problem. The problem occurs every few hours. The cough is non-productive. Associated symptoms include postnasal drip. Pertinent negatives include no chest pain, headaches, rash, shortness of breath or wheezing. She has tried nothing for the symptoms.  Skin cancer - she had lesion removed from right buttock last week.  It is tender and she wants to have it checked for infection.  Lab Results  Component Value Date   CREATININE 0.88 09/16/2019   BUN 14 09/16/2019   NA 141 09/16/2019   K 4.1 09/16/2019   CL 103 09/16/2019   CO2 25 09/16/2019   Lab Results  Component Value Date   CHOL 251 (H) 09/16/2019   HDL 73 09/16/2019   LDLCALC 164 (H) 09/16/2019   TRIG 84 09/16/2019   CHOLHDL 3.4 09/16/2019   Lab Results  Component Value Date   TSH 1.660  09/16/2019   No results found for: HGBA1C Lab Results  Component Value Date   WBC 4.0 09/16/2019   HGB 14.5 09/16/2019   HCT 44.4 09/16/2019   MCV 86 09/16/2019   PLT 198 09/16/2019   Lab Results  Component Value Date   ALT 11 09/16/2019   AST 16 09/16/2019   ALKPHOS 79 09/16/2019   BILITOT 0.8 09/16/2019     Review of Systems  Constitutional: Positive for appetite change, fatigue and unexpected weight change.  HENT: Positive for postnasal drip. Negative for trouble swallowing.   Respiratory: Positive for cough. Negative for chest tightness, shortness of breath and wheezing.   Cardiovascular: Negative for chest pain, palpitations and leg swelling.  Musculoskeletal: Positive for back pain. Negative for arthralgias and gait problem.  Skin: Positive for wound. Negative for rash.  Neurological: Negative for dizziness and headaches.  Psychiatric/Behavioral: Positive for depression and suicidal ideas. The patient has insomnia.     Patient Active Problem List   Diagnosis Date Noted  . Lumbar degenerative disc disease 11/02/2019  . Gastroesophageal reflux disease without esophagitis 09/10/2018  . Neoplasm of uncertain behavior of skin 09/13/2016  . Muscle cramp, nocturnal 09/13/2016  . Anxiety and depression 03/22/2016  . Adenomatous colon polyp 02/07/2015  . Hyperlipidemia, mild 02/06/2015  . Fatigue 10/26/2014  . Fibrocystic breast changes 10/26/2014  . Depression, major, recurrent, moderate ([place]) 10/26/2014    Allergies  Allergen Reactions  . Niacin And Related Hives  blisters    Past Surgical History:  Procedure Laterality Date  . CATARACT EXTRACTION W/PHACO Right 09/02/2017   Procedure: CATARACT EXTRACTION PHACO AND INTRAOCULAR LENS PLACEMENT ([place]) RIGHT SYMFONY TORIC;  Surgeon: Leandrew Koyanagi, MD;  Location: [facility];  Service: Ophthalmology;  Laterality: Right;  . CATARACT EXTRACTION W/PHACO Left 10/30/2017   Procedure: CATARACT EXTRACTION PHACO  AND INTRAOCULAR LENS PLACEMENT ([place])  LEFT SYMFONY LENS;  Surgeon: Leandrew Koyanagi, MD;  Location: [facility];  Service: Ophthalmology;  Laterality: Left;  . COLONOSCOPY W/ POLYPECTOMY  2012   tubular adenoma 0.5cm was removed from descending colon  . FACIAL COSMETIC SURGERY  2015  . PARTIAL HYSTERECTOMY     bleeding; cervix and ovary remains    Social History   Tobacco Use  . Smoking status: Never Smoker  . Smokeless tobacco: Never Used  Vaping Use  . Vaping Use: Never used  Substance Use Topics  . Alcohol use: No    Alcohol/week: 0.0 standard drinks  . Drug use: No     Medication list has been reviewed and updated.  Current Meds  Medication Sig  . ARIPiprazole (ABILIFY) 5 MG tablet Take 1 tablet (5 mg total) by mouth daily.  . cyclobenzaprine (FLEXERIL) 10 MG tablet TAKE 1 TABLET(10 MG) BY MOUTH AT BEDTIME AS NEEDED FOR MUSCLE SPASMS  . desvenlafaxine (PRISTIQ) 100 MG 24 hr tablet TAKE 1 TABLET(100 MG) BY MOUTH DAILY  . meloxicam (MOBIC) 15 MG tablet Take 15 mg by mouth daily. Back pain  . naproxen sodium (ALEVE) 220 MG tablet Take 220 mg by mouth.    PHQ 2/9 Scores 02/08/2020 11/02/2019 09/16/2019 12/16/2018  PHQ - 2 Score 6 2 6  0  PHQ- 9 Score 18 10 17 5     GAD 7 : Generalized Anxiety Score 02/08/2020 11/02/2019 09/16/2019 10/22/2018  Nervous, Anxious, on Edge 2 2 0 3  Control/stop worrying 2 0 0 3  Worry too much - different things 2 0 0 3  Trouble relaxing 2 0 2 0  Restless 2 0 2 0  Easily annoyed or irritable 1 2 3 3   Afraid - awful might happen 3 0 0 3  Total GAD 7 Score 14 4 7 15   Anxiety Difficulty Extremely difficult Not difficult at all Not difficult at all Somewhat difficult    BP Readings from Last 3 Encounters:  02/08/20 116/72  11/02/19 126/78  09/16/19 124/78    Physical Exam Vitals and nursing note reviewed.  Constitutional:      General: She is not in acute distress.    Appearance: She is well-developed.  HENT:     Head:  Normocephalic and atraumatic.     Nose: Nose normal.     Mouth/Throat:     Mouth: Mucous membranes are moist.  Cardiovascular:     Rate and Rhythm: Normal rate and regular rhythm.     Pulses: Normal pulses.  Pulmonary:     Effort: Pulmonary effort is normal. No respiratory distress.     Breath sounds: No wheezing or rhonchi.  Musculoskeletal:        General: Normal range of motion.     Cervical back: Normal range of motion and neck supple.  Skin:    General: Skin is warm and dry.     Capillary Refill: Capillary refill takes less than 2 seconds.     Findings: Wound present. No rash.          Comments: 1 cm lesion with shallow ulcer and eschar -  mild rim of pink skin - no induration/drainage/odor  Neurological:     General: No focal deficit present.     Mental Status: She is alert and oriented to person, place, and time.  Psychiatric:        Mood and Affect: Mood normal.        Behavior: Behavior normal.     Wt Readings from Last 3 Encounters:  02/08/20 167 lb 12.8 oz (76.1 kg)  11/02/19 158 lb (71.7 kg)  09/16/19 153 lb (69.4 kg)    BP 116/72   Pulse 79   Ht 5\' 4"  (1.626 m)   Wt 167 lb 12.8 oz (76.1 kg)   SpO2 99%   BMI 28.80 kg/m   Assessment and Plan: 1. Depression, major, recurrent, moderate ([place]) Not as well controlled as last visit with the additional problem of weight gain Mild SI but no plan established Will stop Abilify but continue Pristiq 100 mg Add cytomel - liothyronine (CYTOMEL) 5 MCG tablet; Take 1 tablet (5 mcg total) by mouth daily.  Dispense: 90 tablet; Refill: 0  2. Visit for wound check Wound is healing with early eschar, mild surrounding erythema without drainage, warmth or fluctuance Apply Vaseline bid  3. Chronic bilateral low back pain with left-sided sciatica Continue flexeril and meloxicam PRN - cyclobenzaprine (FLEXERIL) 10 MG tablet; Take 1 tablet (10 mg total) by mouth at bedtime as needed for muscle spasms.  Dispense: 30 tablet;  Refill: 5 - meloxicam (MOBIC) 15 MG tablet; Take 1 tablet (15 mg total) by mouth daily as needed for pain. Back pain  Dispense: 30 tablet; Refill: 5  4. Allergic cough Try Claritin 10 mg daily   Partially dictated using Editor, commissioning. Any errors are unintentional.  Halina Maidens, MD [facility]  02/08/2020

## 2020-02-08 NOTE — Patient Instructions (Addendum)
Stop Abilify (aripriprazole)  Begin Cytomel daily  Continue Desvenlafaxine 100 mg  Try Claritin 10 mg once a day.

## 2020-03-02 ENCOUNTER — Encounter: Payer: Self-pay | Admitting: Internal Medicine

## 2020-03-08 ENCOUNTER — Encounter: Payer: Self-pay | Admitting: Internal Medicine

## 2020-03-08 ENCOUNTER — Other Ambulatory Visit: Payer: Self-pay

## 2020-03-08 ENCOUNTER — Ambulatory Visit (INDEPENDENT_AMBULATORY_CARE_PROVIDER_SITE_OTHER): Payer: 59 | Admitting: Internal Medicine

## 2020-03-08 VITALS — BP 108/80 | HR 76 | Temp 98.1°F | Ht 64.0 in | Wt 160.0 lb

## 2020-03-08 DIAGNOSIS — F331 Major depressive disorder, recurrent, moderate: Secondary | ICD-10-CM

## 2020-03-08 DIAGNOSIS — D1723 Benign lipomatous neoplasm of skin and subcutaneous tissue of right leg: Secondary | ICD-10-CM

## 2020-03-08 MED ORDER — FLUOXETINE HCL 20 MG PO CAPS: 20.0000 mg | ORAL_CAPSULE | Freq: Every day | ORAL | 2 refills | Status: DC

## 2020-03-08 NOTE — Progress Notes (Signed)
Date:  03/08/2020   Name:  Desiree Grant   DOB:  22-May-1955   MRN:  [id number]   Chief Complaint: Diabetes (Follow up today from 1 month ago.) and Leg Pain (Right upper thigh pain- noticed a lump a month ago and forgot to mention at last visit. Its painful. Tender to touch but pain is shooting up leg at night while laying down.)  Leg Pain  There was no injury mechanism. The pain is present in the right leg. The quality of the pain is described as aching. The pain is mild. The pain has been fluctuating since onset. Associated symptoms comments: Notice a lump on right mid anterior thigh .  Depression        This is a chronic problem.  The problem occurs constantly.The problem is unchanged.  Associated symptoms include fatigue, helplessness, hopelessness, decreased interest and suicidal ideas.  Associated symptoms include does not have insomnia and no headaches.  Past treatments include SNRIs - Serotonin and norepinephrine reuptake inhibitors and other medications (added cytomel last visit).  Compliance with treatment is good.  Previous treatment provided mild relief.   Lab Results  Component Value Date   CREATININE 0.88 09/16/2019   BUN 14 09/16/2019   NA 141 09/16/2019   K 4.1 09/16/2019   CL 103 09/16/2019   CO2 25 09/16/2019   Lab Results  Component Value Date   CHOL 251 (H) 09/16/2019   HDL 73 09/16/2019   LDLCALC 164 (H) 09/16/2019   TRIG 84 09/16/2019   CHOLHDL 3.4 09/16/2019   Lab Results  Component Value Date   TSH 1.660 09/16/2019   No results found for: HGBA1C Lab Results  Component Value Date   WBC 4.0 09/16/2019   HGB 14.5 09/16/2019   HCT 44.4 09/16/2019   MCV 86 09/16/2019   PLT 198 09/16/2019   Lab Results  Component Value Date   ALT 11 09/16/2019   AST 16 09/16/2019   ALKPHOS 79 09/16/2019   BILITOT 0.8 09/16/2019     Review of Systems  Constitutional: Positive for fatigue. Negative for chills, diaphoresis, fever and unexpected weight change.    Respiratory: Negative for cough, chest tightness, shortness of breath and wheezing.   Cardiovascular: Negative for chest pain and palpitations.  Neurological: Negative for dizziness and headaches.  Psychiatric/Behavioral: Positive for depression and suicidal ideas. The patient does not have insomnia.     Patient Active Problem List   Diagnosis Date Noted   Lumbar degenerative disc disease 11/02/2019   Gastroesophageal reflux disease without esophagitis 09/10/2018   Neoplasm of uncertain behavior of skin 09/13/2016   Muscle cramp, nocturnal 09/13/2016   Anxiety and depression 03/22/2016   Adenomatous colon polyp 02/07/2015   Hyperlipidemia, mild 02/06/2015   Fatigue 10/26/2014   Fibrocystic breast changes 10/26/2014   Depression, major, recurrent, moderate ([facility]) 10/26/2014    Allergies  Allergen Reactions   Niacin And Related Hives    blisters    Past Surgical History:  Procedure Laterality Date   CATARACT EXTRACTION W/PHACO Right 09/02/2017   Procedure: CATARACT EXTRACTION PHACO AND INTRAOCULAR LENS PLACEMENT ([place]) RIGHT SYMFONY TORIC;  Surgeon: Leandrew Koyanagi, MD;  Location: [facility];  Service: Ophthalmology;  Laterality: Right;   CATARACT EXTRACTION W/PHACO Left 10/30/2017   Procedure: CATARACT EXTRACTION PHACO AND INTRAOCULAR LENS PLACEMENT ([place])  LEFT SYMFONY LENS;  Surgeon: Leandrew Koyanagi, MD;  Location: [place];  Service: Ophthalmology;  Laterality: Left;   COLONOSCOPY W/ POLYPECTOMY  2012  tubular adenoma 0.5cm was removed from descending colon   FACIAL COSMETIC SURGERY  2015   PARTIAL HYSTERECTOMY     bleeding; cervix and ovary remains    Social History   Tobacco Use   Smoking status: Never Smoker   Smokeless tobacco: Never Used  Vaping Use   Vaping Use: Never used  Substance Use Topics   Alcohol use: No    Alcohol/week: 0.0 standard drinks   Drug use: No     Medication list has been reviewed and  updated.  Current Meds  Medication Sig   cyclobenzaprine (FLEXERIL) 10 MG tablet Take 1 tablet (10 mg total) by mouth at bedtime as needed for muscle spasms.   desvenlafaxine (PRISTIQ) 100 MG 24 hr tablet TAKE 1 TABLET(100 MG) BY MOUTH DAILY   liothyronine (CYTOMEL) 5 MCG tablet Take 1 tablet (5 mcg total) by mouth daily.   meloxicam (MOBIC) 15 MG tablet Take 1 tablet (15 mg total) by mouth daily as needed for pain. Back pain    PHQ 2/9 Scores 03/08/2020 02/08/2020 11/02/2019 09/16/2019  PHQ - 2 Score 6 6 2 6   PHQ- 9 Score 20 18 10 17     GAD 7 : Generalized Anxiety Score 03/08/2020 02/08/2020 11/02/2019 09/16/2019  Nervous, Anxious, on Edge 1 2 2  0  Control/stop worrying 3 2 0 0  Worry too much - different things 2 2 0 0  Trouble relaxing 2 2 0 2  Restless 1 2 0 2  Easily annoyed or irritable 1 1 2 3   Afraid - awful might happen 3 3 0 0  Total GAD 7 Score 13 14 4 7   Anxiety Difficulty Somewhat difficult Extremely difficult Not difficult at all Not difficult at all    BP Readings from Last 3 Encounters:  03/08/20 108/80  02/08/20 116/72  11/02/19 126/78    Physical Exam Vitals and nursing note reviewed.  Constitutional:      General: She is not in acute distress.    Appearance: She is well-developed.  HENT:     Head: Normocephalic and atraumatic.  Cardiovascular:     Rate and Rhythm: Normal rate and regular rhythm.  Pulmonary:     Effort: Pulmonary effort is normal. No respiratory distress.     Breath sounds: No wheezing or rhonchi.  Musculoskeletal:        General: Normal range of motion.  Skin:    General: Skin is warm and dry.     Findings: No rash.       Neurological:     Mental Status: She is alert and oriented to person, place, and time.  Psychiatric:        Attention and Perception: Attention normal.        Mood and Affect: Mood is depressed.        Behavior: Behavior normal.        Thought Content: Thought content normal. Thought content does not include  suicidal ideation. Thought content does not include suicidal (but frequent thought) plan.        Cognition and Memory: Cognition normal.     Wt Readings from Last 3 Encounters:  03/08/20 160 lb (72.6 kg)  02/08/20 167 lb 12.8 oz (76.1 kg)  11/02/19 158 lb (71.7 kg)    BP 108/80    Pulse 76    Temp 98.1 F (36.7 C) (Oral)    Ht 5\' 4"  (1.626 m)    Wt 160 lb (72.6 kg)    SpO2 98%  BMI 27.46 kg/m   Assessment and Plan: 1. Depression, major, recurrent, moderate ([place]) Discontinue Pristiq; continue Cytomel Being Prozac 20 mg. Follow up in 6 weeks - if no improvement will refer to Psych if patient agrees - FLUoxetine (PROZAC) 20 MG capsule; Take 1 capsule (20 mg total) by mouth daily.  Dispense: 30 capsule; Refill: 2  2. Lipoma of right lower extremity Pt is reassured   Partially dictated using Editor, commissioning. Any errors are unintentional.  Halina Maidens, MD [facility]  03/08/2020

## 2020-03-10 ENCOUNTER — Telehealth: Payer: Self-pay

## 2020-03-10 ENCOUNTER — Encounter: Payer: Self-pay | Admitting: Internal Medicine

## 2020-03-10 NOTE — Telephone Encounter (Signed)
Called and spoke with patient after reading her my chart messaged about having a nervous break down.   She said she has been crying nonstop and cannot sit still, and she feels very anxious. Asked her if she is alone, and where she is located- she said she is with her friend about to go to his mothers house and she is not alone. She then began to cry. Asked her if she feels suicidal or has a plan to harm herself and she said no ( patient usually refuses to answer this question at her office visits.)  Spoke with Dr. Army Melia who was sitting next to me during this call and told the patient she recommends that she be evaluated at the hospital in [facility]. Explained they can help her, and also give her something to help calm her down as well. She said she did not want to go to the hospital right now. She said she will have to wait until after the weekend because her grandson has a birthday party she wants to go to Saturday. Told her this is important, and she should not wait to go for this. She asked me what to say when she got to the hospital - Told her to go to the ER and tell them she is having a nervous break down and they will know what to do from there and they can help her. Told her she should go now and should not drive herself. Told her to ask her friend or someone else to drive her to the hospital now. She started to cry again- but said okay. Told her Dr. Army Melia and I will not be here this afternoon because its our half day so we really want her to go to the hospital to be checked out and evaluated. She said okay. I told her I hope she feels better and I am sorry that she is feeling this way at this time. She thanked me and we ended the call.   Jaydence Vanyo

## 2020-03-11 ENCOUNTER — Encounter: Payer: Self-pay | Admitting: Internal Medicine

## 2020-03-12 ENCOUNTER — Encounter: Payer: Self-pay | Admitting: Internal Medicine

## 2020-03-13 ENCOUNTER — Other Ambulatory Visit: Payer: Self-pay | Admitting: Internal Medicine

## 2020-03-13 DIAGNOSIS — F413 Other mixed anxiety disorders: Secondary | ICD-10-CM

## 2020-03-13 MED ORDER — LORAZEPAM 1 MG PO TABS: 1.0000 mg | ORAL_TABLET | Freq: Four times a day (QID) | ORAL | 0 refills | Status: DC | PRN

## 2020-03-14 ENCOUNTER — Other Ambulatory Visit: Payer: Self-pay

## 2020-03-14 ENCOUNTER — Telehealth: Payer: Self-pay | Admitting: Internal Medicine

## 2020-03-14 DIAGNOSIS — F331 Major depressive disorder, recurrent, moderate: Secondary | ICD-10-CM

## 2020-03-14 DIAGNOSIS — F413 Other mixed anxiety disorders: Secondary | ICD-10-CM

## 2020-03-14 NOTE — Telephone Encounter (Signed)
Daughter Drue Dun calling this am requesting Dr Carolin Coy to call her asap. She states pt has been getting conflicting information concerning her medications. She states the ED is telling her one thing and Dr Carolin Coy is telling her another. Krisitie states pt is at [facility] with her boyfriend and she doesn't know what is going on with her mother. Please call asap  [phone]

## 2020-03-14 NOTE — Telephone Encounter (Signed)
Called and spoke with patients daughter Drue Dun. Informed her Dr. Army Melia is unable to call at this time because she is seeing patients but explained that she wanted her mother to continue to take Lexapro and stop the Prozac for now. Before I could finish my sentence she interrupted me abruptly and state that she could not pronounce Dr Gaspar Cola last name so therefore she called her Dr Mickel Baas for the remainder of the call and said that " Dr. Mickel Baas needs to STOP! She has not business telling my mother what to do when someone was willing to see her in person and she wouldn't" She then went on to say that her mother was not " her self this weekend" and she seemed "demented." She said she was going now to pick her up and take her to psychiatry - she not state where. She did state " My brother is seeking legal advise and help on this case against Dr Mickel Baas."  She stated 3 doctors this weekend claimed Dr. Mickel Baas had no business prescribing psychiatric meds to her mother. She then continued to yell " DR. Mickel Baas needs to STOP" several more times on the phone. She said she does not want Dr Mickel Baas to respond to her mother anymore because its conflicting with what the ER doctor said. I explained that Dr. Army Melia did want her mother to stop the medication without weaning because it could cause serious side effects. She then began to yell again and state she knows because she is bipolar and on other antidepressants herself. She said that she wanted to speak to Dr. Mickel Baas because of all of this. Explained she is currently seeing patients every 20 minutes and cannot call at this time. She then yelled " I sent a message this morning at 8 o'clock." Apologized and said I am her assistant and this is the quickest I could call between seeing patients. Told her I will document this call including informing Dr Army Melia about her brother wanting to seek legal on her - and she said " GOOD!" and then hung up the phone on me.  Lilianna Case, CMA ( AAMA )

## 2020-03-14 NOTE — Telephone Encounter (Signed)
Patient's son is calling to request a referral for a psychiatrist which he stated the doctor told the patient she needs to see.  Please advise and call patient's son Lennette Bihari)  to discuss at [phone].

## 2020-03-14 NOTE — Telephone Encounter (Signed)
Called to speak with patient. Placed urgent referral to Dr Nicolasa Ducking.

## 2020-03-15 ENCOUNTER — Telehealth: Payer: Self-pay

## 2020-03-15 ENCOUNTER — Encounter: Payer: Self-pay | Admitting: Internal Medicine

## 2020-03-15 NOTE — Telephone Encounter (Signed)
Spoke to patient in regards to the couple of telephone messages coming in from the [facility].  One in particular was from an aunt who is not auhtorized to speak on behalf of the pt.  Informed pt of the message that she needed an appointment with you & patient stated she did not.  I told patient we are still working on the referral with Dr Nicolasa Ducking which patint stated she has an appointment on 03/16/20 at 1pm with Dr Nicolasa Ducking.  Also informed patient that her daughter who is on the DPR can not call to the office screaming at staff & threatening legal action.  Informed patient that if this happens again it may be cause for dismissal from the practice.  I told the patient that her care is our top priority & that her family needs to understand that.  Patient verbalized that she understood.

## 2020-03-15 NOTE — Telephone Encounter (Signed)
Pt has a appt 03/16/20 with Dr. Nicolasa Ducking.  KP

## 2020-03-15 NOTE — Telephone Encounter (Signed)
Copied from [place] [id number]. Topic: Referral - Status >> Mar 15, 2020  3:07 PM Lennox Solders wrote: Reason for CRM: Pt is aware dr Nicolasa Ducking is viewing her records. Pt does not want to go to [place] to see psychiatrist >> Mar 15, 2020  3:39 PM Greggory Keen D wrote: Pt's aunt Windell Moment called. She ask that an appt be made for the pt with Dr. Army Melia. She ask that this not be disclosed to anyone.  CB#  [phone]

## 2020-03-15 NOTE — Telephone Encounter (Signed)
Copied from [place] [id number]. Topic: Referral - Status >> Mar 15, 2020  3:07 PM Lennox Solders wrote: Reason for CRM: Pt is aware dr Nicolasa Ducking is viewing her records. Pt does not want to go to [place] to see psychiatrist

## 2020-03-18 ENCOUNTER — Other Ambulatory Visit: Payer: Self-pay | Admitting: Internal Medicine

## 2020-04-11 ENCOUNTER — Ambulatory Visit: Payer: 59 | Admitting: Internal Medicine

## 2020-04-24 ENCOUNTER — Encounter: Payer: Self-pay | Admitting: Internal Medicine

## 2020-04-25 ENCOUNTER — Other Ambulatory Visit: Payer: Self-pay

## 2020-04-25 ENCOUNTER — Telehealth: Payer: Self-pay

## 2020-04-25 ENCOUNTER — Ambulatory Visit (INDEPENDENT_AMBULATORY_CARE_PROVIDER_SITE_OTHER): Payer: 59 | Admitting: Internal Medicine

## 2020-04-25 ENCOUNTER — Encounter: Payer: Self-pay | Admitting: Internal Medicine

## 2020-04-25 VITALS — Ht 64.0 in | Wt 160.0 lb

## 2020-04-25 DIAGNOSIS — J40 Bronchitis, not specified as acute or chronic: Secondary | ICD-10-CM

## 2020-04-25 MED ORDER — PREDNISONE 10 MG PO TABS: 10.0000 mg | ORAL_TABLET | ORAL | 0 refills | Status: AC

## 2020-04-25 MED ORDER — AMOXICILLIN-POT CLAVULANATE 875-125 MG PO TABS: 1.0000 | ORAL_TABLET | Freq: Two times a day (BID) | ORAL | 0 refills | Status: AC

## 2020-04-25 MED ORDER — PROMETHAZINE-DM 6.25-15 MG/5ML PO SYRP: 5.0000 mL | ORAL_SOLUTION | Freq: Four times a day (QID) | ORAL | 0 refills | Status: DC | PRN

## 2020-04-25 NOTE — Telephone Encounter (Signed)
This visit type is being conducted due to national recommendations for restrictions regarding the COVID- 19 Pandemic (e.g. social distancing) in effort to limit this patients exposure and mitigate transmission in our community. This visit type is felt to be most appropriate for this patient at this time. I connected with the patient today and received telephone consent from the patient and patient understand this consent will be good for 1 year.  KP  

## 2020-04-25 NOTE — Progress Notes (Signed)
Date:  04/25/2020   Name:  Desiree Grant   DOB:  [birth year]   MRN:  [id number]  This encounter was conducted via video encounter due to the need for social distancing in light of the Covid-19 pandemic.  The patient was correctly identified.  I advised that I am conducting the visit from a secure room in my office at [facility] clinic.   The limitations of this form of encounter were discussed with the patient and he/she agreed to proceed.  Some vital signs will be absent.  Chief Complaint: Cough (X1 week, Chest congestion, coughing up thick yellow mucous, had fever but not anymore )  Cough This is a new problem. The current episode started in the past 7 days. The cough is productive of sputum. Associated symptoms include headaches (mild at times). Pertinent negatives include no chest pain, chills, ear congestion, fever, nasal congestion, sore throat, shortness of breath or wheezing. She has tried OTC cough suppressant for the symptoms.  She has not been out of the house for the past few weeks. No known exposure to Covid.   No loss of taste or smell. No fever or chills. Frequent loose cough with yellow sputum.  Only mild wheezing after coughing episodes.  Lab Results  Component Value Date   CREATININE 0.88 09/16/2019   BUN 14 09/16/2019   NA 141 09/16/2019   K 4.1 09/16/2019   CL 103 09/16/2019   CO2 25 09/16/2019   Lab Results  Component Value Date   CHOL 251 (H) 09/16/2019   HDL 73 09/16/2019   LDLCALC 164 (H) 09/16/2019   TRIG 84 09/16/2019   CHOLHDL 3.4 09/16/2019   Lab Results  Component Value Date   TSH 1.660 09/16/2019   No results found for: HGBA1C Lab Results  Component Value Date   WBC 4.0 09/16/2019   HGB 14.5 09/16/2019   HCT 44.4 09/16/2019   MCV 86 09/16/2019   PLT 198 09/16/2019   Lab Results  Component Value Date   ALT 11 09/16/2019   AST 16 09/16/2019   ALKPHOS 79 09/16/2019   BILITOT 0.8 09/16/2019     Review of Systems  Constitutional:  Negative for chills, diaphoresis and fever.  HENT: Negative for sinus pressure, sore throat and trouble swallowing.   Respiratory: Positive for cough and chest tightness. Negative for shortness of breath and wheezing.   Cardiovascular: Negative for chest pain and palpitations.  Neurological: Positive for headaches (mild at times).  Psychiatric/Behavioral: Negative for dysphoric mood and sleep disturbance. The patient is nervous/anxious.     Patient Active Problem List   Diagnosis Date Noted  . Other mixed anxiety disorders 03/13/2020  . Lipoma of right lower extremity 03/08/2020  . Lumbar degenerative disc disease 11/02/2019  . Gastroesophageal reflux disease without esophagitis 09/10/2018  . Neoplasm of uncertain behavior of skin 09/13/2016  . Muscle cramp, nocturnal 09/13/2016  . Anxiety and depression 03/22/2016  . Adenomatous colon polyp 02/07/2015  . Hyperlipidemia, mild 02/06/2015  . Fatigue 10/26/2014  . Fibrocystic breast changes 10/26/2014  . Depression, major, recurrent, moderate ([place]) 10/26/2014    Allergies  Allergen Reactions  . Niacin And Related Hives    blisters    Past Surgical History:  Procedure Laterality Date  . CATARACT EXTRACTION W/PHACO Right 09/02/2017   Procedure: CATARACT EXTRACTION PHACO AND INTRAOCULAR LENS PLACEMENT ([place]) RIGHT SYMFONY TORIC;  Surgeon: Leandrew Koyanagi, MD;  Location: [place];  Service: Ophthalmology;  Laterality: Right;  . CATARACT EXTRACTION  W/PHACO Left 10/30/2017   Procedure: CATARACT EXTRACTION PHACO AND INTRAOCULAR LENS PLACEMENT ([place])  LEFT SYMFONY LENS;  Surgeon: Leandrew Koyanagi, MD;  Location: [place];  Service: Ophthalmology;  Laterality: Left;  . COLONOSCOPY W/ POLYPECTOMY  2012   tubular adenoma 0.5cm was removed from descending colon  . FACIAL COSMETIC SURGERY  2015  . PARTIAL HYSTERECTOMY     bleeding; cervix and ovary remains    Social History   Tobacco Use  . Smoking status:  Never Smoker  . Smokeless tobacco: Never Used  Vaping Use  . Vaping Use: Never used  Substance Use Topics  . Alcohol use: No    Alcohol/week: 0.0 standard drinks  . Drug use: No     Medication list has been reviewed and updated.  Current Meds  Medication Sig  . citalopram (CELEXA) 10 MG tablet Take 40 mg by mouth every morning.  . hydrOXYzine (VISTARIL) 25 MG capsule Take 25 mg by mouth daily as needed.  . meloxicam (MOBIC) 15 MG tablet Take 1 tablet (15 mg total) by mouth daily as needed for pain. Back pain  . QUEtiapine (SEROQUEL) 100 MG tablet Take 100 mg by mouth at bedtime.  . [DISCONTINUED] desvenlafaxine (PRISTIQ) 100 MG 24 hr tablet Take 100 mg by mouth daily.    PHQ 2/9 Scores 04/25/2020 03/08/2020 02/08/2020 11/02/2019  PHQ - 2 Score 0 6 6 2   PHQ- 9 Score 0 20 18 10     GAD 7 : Generalized Anxiety Score 04/25/2020 03/08/2020 02/08/2020 11/02/2019  Nervous, Anxious, on Edge 0 1 2 2   Control/stop worrying 0 3 2 0  Worry too much - different things 0 2 2 0  Trouble relaxing 0 2 2 0  Restless 0 1 2 0  Easily annoyed or irritable 0 1 1 2   Afraid - awful might happen 0 3 3 0  Total GAD 7 Score 0 13 14 4   Anxiety Difficulty - Somewhat difficult Extremely difficult Not difficult at all    BP Readings from Last 3 Encounters:  03/08/20 108/80  02/08/20 116/72  11/02/19 126/78    Physical Exam Constitutional:      Appearance: Normal appearance.  Pulmonary:     Effort: Pulmonary effort is normal.     Comments: Frequent loose cough noted during the call.  No apparent dyspnea. Neurological:     Mental Status: She is alert.  Psychiatric:        Attention and Perception: Attention normal.        Mood and Affect: Mood normal.        Speech: Speech normal.     Wt Readings from Last 3 Encounters:  04/25/20 160 lb (72.6 kg)  03/08/20 160 lb (72.6 kg)  02/08/20 167 lb 12.8 oz (76.1 kg)    Ht 5\' 4"  (1.626 m)   Wt 160 lb (72.6 kg)   BMI 27.46 kg/m   Assessment and  Plan: 1. Bronchitis Doubt Covid due to homebound status and prior vaccination Consider home Covid test for reassurance Call if symptoms are worsening - amoxicillin-clavulanate (AUGMENTIN) 875-125 MG tablet; Take 1 tablet by mouth 2 (two) times daily for 10 days.  Dispense: 20 tablet; Refill: 0 - predniSONE (DELTASONE) 10 MG tablet; Take 1 tablet (10 mg total) by mouth as directed for 6 days. Take 6,5,4,3,2,1 then stop  Dispense: 21 tablet; Refill: 0 - promethazine-dextromethorphan (PROMETHAZINE-DM) 6.25-15 MG/5ML syrup; Take 5 mLs by mouth 4 (four) times daily as needed for cough.  Dispense: 180 mL;  Refill: 0  I spent 10 minutes on this encounter. Partially dictated using Editor, commissioning. Any errors are unintentional.  Halina Maidens, MD [facility]  04/25/2020

## 2020-05-05 ENCOUNTER — Other Ambulatory Visit: Payer: Self-pay | Admitting: Internal Medicine

## 2020-05-05 DIAGNOSIS — F331 Major depressive disorder, recurrent, moderate: Secondary | ICD-10-CM

## 2020-05-11 ENCOUNTER — Other Ambulatory Visit: Payer: Self-pay | Admitting: Internal Medicine

## 2020-05-11 ENCOUNTER — Encounter: Payer: Self-pay | Admitting: Internal Medicine

## 2020-05-11 DIAGNOSIS — J40 Bronchitis, not specified as acute or chronic: Secondary | ICD-10-CM

## 2020-05-11 MED ORDER — PROMETHAZINE-DM 6.25-15 MG/5ML PO SYRP: 5.0000 mL | ORAL_SOLUTION | Freq: Four times a day (QID) | ORAL | 0 refills | Status: DC | PRN

## 2020-08-19 ENCOUNTER — Other Ambulatory Visit: Payer: Self-pay | Admitting: Internal Medicine

## 2020-08-19 DIAGNOSIS — M5442 Lumbago with sciatica, left side: Secondary | ICD-10-CM

## 2020-08-19 DIAGNOSIS — G8929 Other chronic pain: Secondary | ICD-10-CM

## 2020-08-22 ENCOUNTER — Encounter: Payer: Self-pay | Admitting: Internal Medicine

## 2020-09-12 ENCOUNTER — Other Ambulatory Visit: Payer: Self-pay | Admitting: Internal Medicine

## 2020-09-12 DIAGNOSIS — J40 Bronchitis, not specified as acute or chronic: Secondary | ICD-10-CM

## 2020-09-20 ENCOUNTER — Other Ambulatory Visit (HOSPITAL_COMMUNITY)
Admission: RE | Admit: 2020-09-20 | Discharge: 2020-09-20 | Disposition: A | Discharge status: Home / Self Care | Payer: BLUE CROSS/BLUE SHIELD | Source: Ambulatory Visit | Attending: Internal Medicine | Admitting: Internal Medicine

## 2020-09-20 ENCOUNTER — Other Ambulatory Visit: Payer: Self-pay

## 2020-09-20 ENCOUNTER — Encounter: Payer: Self-pay | Admitting: Internal Medicine

## 2020-09-20 ENCOUNTER — Ambulatory Visit (INDEPENDENT_AMBULATORY_CARE_PROVIDER_SITE_OTHER): Payer: 59 | Admitting: Internal Medicine

## 2020-09-20 VITALS — BP 126/78 | HR 89 | Temp 98.4°F | Ht 64.0 in | Wt 172.0 lb

## 2020-09-20 DIAGNOSIS — Z124 Encounter for screening for malignant neoplasm of cervix: Secondary | ICD-10-CM | POA: Diagnosis not present

## 2020-09-20 DIAGNOSIS — Z1211 Encounter for screening for malignant neoplasm of colon: Secondary | ICD-10-CM

## 2020-09-20 DIAGNOSIS — Z1231 Encounter for screening mammogram for malignant neoplasm of breast: Secondary | ICD-10-CM | POA: Diagnosis not present

## 2020-09-20 DIAGNOSIS — G8929 Other chronic pain: Secondary | ICD-10-CM

## 2020-09-20 DIAGNOSIS — Z0000 Encounter for general adult medical examination without abnormal findings: Secondary | ICD-10-CM

## 2020-09-20 DIAGNOSIS — J329 Chronic sinusitis, unspecified: Secondary | ICD-10-CM

## 2020-09-20 DIAGNOSIS — M25551 Pain in right hip: Secondary | ICD-10-CM | POA: Diagnosis not present

## 2020-09-20 DIAGNOSIS — F331 Major depressive disorder, recurrent, moderate: Secondary | ICD-10-CM

## 2020-09-20 MED ORDER — METHOCARBAMOL 500 MG PO TABS: 500.0000 mg | ORAL_TABLET | Freq: Every day | ORAL | 1 refills | Status: DC

## 2020-09-20 MED ORDER — AZITHROMYCIN 250 MG PO TABS: ORAL_TABLET | ORAL | 0 refills | Status: AC

## 2020-09-20 NOTE — Progress Notes (Signed)
Date:  09/20/2020   Name:  Desiree Grant   DOB:  12-Aug-1955   MRN:  [id number]   Chief Complaint: Annual Exam (Breast Exam- Pap smear. FIT test. ) and Cough (3 months now. Cough medication helped in the past. At night and when laying down and its worse.)  SHERROL VICARS is a 65 y.o. female who presents today for Desiree Grant Complete Annual Exam. Desiree Grant feels well. Desiree Grant reports exercising - none. Desiree Grant reports Desiree Grant is sleeping fairly well. Breast complaints - pain under both breasts.  Mammogram: 12/2013 DEXA: none Pap smear: 08/2017 neg thin prep Colonoscopy: FIT 09/2019  Immunization History  Administered Date(s) Administered  . Influenza,inj,Quad PF,6+ Mos 02/07/2015, 02/08/2016, 01/23/2018, 02/08/2020  . Janssen (J&J) SARS-COV-2 Vaccination 08/06/2019    Cough This is a chronic problem. The current episode started more than 1 month ago. The problem has been unchanged. The problem occurs every few minutes. The cough is productive of sputum (occasionally productive of yellow phlegm). Associated symptoms include postnasal drip. Pertinent negatives include no chest pain, chills, fever, headaches, rash, shortness of breath or wheezing. Desiree Grant has tried prescription cough suppressant for the symptoms. The treatment provided moderate relief. There is no history of asthma, COPD or environmental allergies.  Hip Pain  There was no injury mechanism. The pain is present in the right hip and left hip. The quality of the pain is described as aching. The pain is moderate. The pain has been fluctuating since onset. Desiree Grant has tried NSAIDs for the symptoms. The treatment provided moderate relief.  Back Pain This is a recurrent problem. The pain is present in the lumbar spine. The pain does not radiate. The pain is moderate. The symptoms are aggravated by twisting and bending. Pertinent negatives include no abdominal pain, chest pain, dysuria, fever or headaches. Risk factors include lack of exercise. Desiree Grant has tried NSAIDs for  the symptoms. The treatment provided mild relief.    Lab Results  Component Value Date   CREATININE 0.88 09/16/2019   BUN 14 09/16/2019   NA 141 09/16/2019   K 4.1 09/16/2019   CL 103 09/16/2019   CO2 25 09/16/2019   Lab Results  Component Value Date   CHOL 251 (H) 09/16/2019   HDL 73 09/16/2019   LDLCALC 164 (H) 09/16/2019   TRIG 84 09/16/2019   CHOLHDL 3.4 09/16/2019   Lab Results  Component Value Date   TSH 1.660 09/16/2019   No results found for: HGBA1C Lab Results  Component Value Date   WBC 4.0 09/16/2019   HGB 14.5 09/16/2019   HCT 44.4 09/16/2019   MCV 86 09/16/2019   PLT 198 09/16/2019   Lab Results  Component Value Date   ALT 11 09/16/2019   AST 16 09/16/2019   ALKPHOS 79 09/16/2019   BILITOT 0.8 09/16/2019     Review of Systems  Constitutional: Negative for chills, fatigue and fever.  HENT: Positive for postnasal drip. Negative for congestion, hearing loss, tinnitus, trouble swallowing and voice change.   Eyes: Negative for visual disturbance.  Respiratory: Positive for cough. Negative for chest tightness, shortness of breath and wheezing.   Cardiovascular: Negative for chest pain, palpitations and leg swelling.  Gastrointestinal: Negative for abdominal pain, constipation, diarrhea and vomiting.  Endocrine: Negative for polydipsia and polyuria.  Genitourinary: Negative for dysuria, frequency, genital sores, vaginal bleeding and vaginal discharge.  Musculoskeletal: Positive for back pain. Negative for arthralgias, gait problem and joint swelling.  Skin: Negative for color change and rash.  Allergic/Immunologic: Negative for environmental allergies.  Neurological: Negative for dizziness, tremors, light-headedness and headaches.  Hematological: Negative for adenopathy. Does not bruise/bleed easily.  Psychiatric/Behavioral: Negative for dysphoric mood and sleep disturbance. The patient is not nervous/anxious.     Patient Active Problem List    Diagnosis Date Noted  . Other mixed anxiety disorders 03/13/2020  . Lipoma of right lower extremity 03/08/2020  . Lumbar degenerative disc disease 11/02/2019  . Gastroesophageal reflux disease without esophagitis 09/10/2018  . Neoplasm of uncertain behavior of skin 09/13/2016  . Muscle cramp, nocturnal 09/13/2016  . Anxiety and depression 03/22/2016  . Adenomatous colon polyp 02/07/2015  . Hyperlipidemia, mild 02/06/2015  . Fatigue 10/26/2014  . Fibrocystic breast changes 10/26/2014  . Depression, major, recurrent, moderate ([place]) 10/26/2014    Allergies  Allergen Reactions  . Niacin And Related Hives    blisters    Past Surgical History:  Procedure Laterality Date  . CATARACT EXTRACTION W/PHACO Right 09/02/2017   Procedure: CATARACT EXTRACTION PHACO AND INTRAOCULAR LENS PLACEMENT ([place]) RIGHT SYMFONY TORIC;  Surgeon: Leandrew Koyanagi, MD;  Location: [place];  Service: Ophthalmology;  Laterality: Right;  . CATARACT EXTRACTION W/PHACO Left 10/30/2017   Procedure: CATARACT EXTRACTION PHACO AND INTRAOCULAR LENS PLACEMENT ([place])  LEFT SYMFONY LENS;  Surgeon: Leandrew Koyanagi, MD;  Location: [place];  Service: Ophthalmology;  Laterality: Left;  . COLONOSCOPY W/ POLYPECTOMY  2012   tubular adenoma 0.5cm was removed from descending colon  . FACIAL COSMETIC SURGERY  2015  . PARTIAL HYSTERECTOMY     bleeding; cervix and ovary remains    Social History   Tobacco Use  . Smoking status: Never Smoker  . Smokeless tobacco: Never Used  Vaping Use  . Vaping Use: Never used  Substance Use Topics  . Alcohol use: No    Alcohol/week: 0.0 standard drinks  . Drug use: No     Medication list has been reviewed and updated.  Current Meds  Medication Sig  . citalopram (CELEXA) 20 MG tablet Take 20 mg by mouth every morning. Weaning off Celexa  . meloxicam (MOBIC) 15 MG tablet TAKE 1 TABLET(15 MG) BY MOUTH DAILY AS NEEDED FOR PAIN OR BACK PAIN  . traZODone  (DESYREL) 100 MG tablet Take 100-200 mg by mouth at bedtime as needed.  . venlafaxine XR (EFFEXOR-XR) 150 MG 24 hr capsule Take 150 mg by mouth every morning.    PHQ 2/9 Scores 09/20/2020 04/25/2020 03/08/2020 02/08/2020  PHQ - 2 Score 0 0 6 6  PHQ- 9 Score 6 0 20 18    GAD 7 : Generalized Anxiety Score 09/20/2020 04/25/2020 03/08/2020 02/08/2020  Nervous, Anxious, on Edge 3 0 1 2  Control/stop worrying 3 0 3 2  Worry too much - different things 3 0 2 2  Trouble relaxing 1 0 2 2  Restless 0 0 1 2  Easily annoyed or irritable 1 0 1 1  Afraid - awful might happen 0 0 3 3  Total GAD 7 Score 11 0 13 14  Anxiety Difficulty Somewhat difficult - Somewhat difficult Extremely difficult    BP Readings from Last 3 Encounters:  09/20/20 126/78  03/08/20 108/80  02/08/20 116/72    Physical Exam Vitals and nursing note reviewed.  Constitutional:      General: Desiree Grant is not in acute distress.    Appearance: Desiree Grant is well-developed.  HENT:     Head: Normocephalic and atraumatic.     Right Ear: Tympanic membrane and ear canal normal.  Left Ear: Tympanic membrane and ear canal normal.     Nose:     Right Sinus: No maxillary sinus tenderness.     Left Sinus: No maxillary sinus tenderness.  Eyes:     General: No scleral icterus.       Right eye: No discharge.        Left eye: No discharge.     Conjunctiva/sclera: Conjunctivae normal.  Neck:     Thyroid: No thyromegaly.     Vascular: No carotid bruit.  Cardiovascular:     Rate and Rhythm: Normal rate and regular rhythm.     Pulses: Normal pulses.     Heart sounds: Normal heart sounds.  Pulmonary:     Effort: Pulmonary effort is normal. No respiratory distress.     Breath sounds: No wheezing.  Chest:  Breasts:     Right: No mass, nipple discharge, skin change or tenderness.     Left: No mass, nipple discharge, skin change or tenderness.    Abdominal:     General: Bowel sounds are normal.     Palpations: Abdomen is soft.      Tenderness: There is no abdominal tenderness.  Genitourinary:    Labia:        Right: No tenderness, lesion or injury.        Left: No tenderness, lesion or injury.      Vagina: Normal.     Cervix: Normal.     Uterus: Normal.      Adnexa: Right adnexa normal and left adnexa normal.     Comments: Moderate vaginal atrophy noted, no lesions Musculoskeletal:     Cervical back: Normal range of motion. No erythema.     Right lower leg: No edema.     Left lower leg: No edema.  Lymphadenopathy:     Cervical: No cervical adenopathy.  Skin:    General: Skin is warm and dry.     Findings: No rash.  Neurological:     Mental Status: Desiree Grant is alert and oriented to person, place, and time.     Cranial Nerves: No cranial nerve deficit.     Sensory: No sensory deficit.     Deep Tendon Reflexes: Reflexes are normal and symmetric.  Psychiatric:        Attention and Perception: Attention normal.        Mood and Affect: Mood is depressed.        Speech: Speech normal.        Thought Content: Thought content does not include suicidal plan.        Cognition and Memory: Cognition normal.     Wt Readings from Last 3 Encounters:  09/20/20 172 lb (78 kg)  04/25/20 160 lb (72.6 kg)  03/08/20 160 lb (72.6 kg)    BP 126/78   Pulse 89   Temp 98.4 F (36.9 C) (Oral)   Ht 5\' 4"  (1.626 m)   Wt 172 lb (78 kg)   SpO2 97%   BMI 29.52 kg/m   Assessment and Plan: 1. Annual physical exam Exam is normal except for weight. Encourage regular exercise and appropriate dietary changes. - CBC with Differential/Platelet - Comprehensive metabolic panel - Lipid panel - TSH  2. Encounter for screening mammogram for breast cancer To be scheduled - MM 3D SCREEN BREAST BILATERAL; Future  3. Colon cancer screening - Fecal occult blood, imunochemical  4. Encounter for screening for cervical cancer Pap obtained -  - Cytology - PAP  5. Depression,  major, recurrent, moderate ([place]) Being treated by Psych -  medications are being adjusted Desiree Grant is improving slowly but still does not have much interest outside of the home  6. Hip pain, chronic, right Seen by Ortho last year - xrays essentially normal Continue mobic; add robaxin for soft tissue spasm Begin regular walking program for general strenthening - methocarbamol (ROBAXIN) 500 MG tablet; Take 1 tablet (500 mg total) by mouth at bedtime.  Dispense: 30 tablet; Refill: 1  7. Sinusitis, unspecified chronicity, unspecified location Will treat for chronic low grade sinusitis - if cough persists, will need further evaluation or trial of other therapies Possibly CXR or Pulmonary referral - azithromycin (ZITHROMAX Z-PAK) 250 MG tablet; UAD  Dispense: 6 tablet; Refill: 0   Partially dictated using Editor, commissioning. Any errors are unintentional.  Halina Maidens, MD [facility]  09/20/2020

## 2020-09-21 LAB — LIPID PANEL
Chol/HDL Ratio: 3.8 ratio (ref 0.0–4.4)
Cholesterol, Total: 270 mg/dL — ABNORMAL HIGH (ref 100–199)
HDL: 72 mg/dL (ref >39)
LDL Chol Calc (NIH): 190 mg/dL — ABNORMAL HIGH (ref 0–99)
Triglycerides: 56 mg/dL (ref 0–149)
VLDL Cholesterol Cal: 8 mg/dL (ref 5–40)

## 2020-09-21 LAB — CBC WITH DIFFERENTIAL/PLATELET
Basophils Absolute: 0.1 10*3/uL (ref 0.0–0.2)
Basos: 1 %
EOS (ABSOLUTE): 0.1 10*3/uL (ref 0.0–0.4)
Eos: 2 %
Hematocrit: 42.9 % (ref 34.0–46.6)
Hemoglobin: 14.1 g/dL (ref 11.1–15.9)
Immature Grans (Abs): 0 10*3/uL (ref 0.0–0.1)
Immature Granulocytes: 0 %
Lymphocytes Absolute: 1.5 10*3/uL (ref 0.7–3.1)
Lymphs: 32 %
MCH: 28.4 pg (ref 26.6–33.0)
MCHC: 32.9 g/dL (ref 31.5–35.7)
MCV: 86 fL (ref 79–97)
Monocytes Absolute: 0.3 10*3/uL (ref 0.1–0.9)
Monocytes: 7 %
Neutrophils Absolute: 2.8 10*3/uL (ref 1.4–7.0)
Neutrophils: 58 %
Platelets: 182 10*3/uL (ref 150–450)
RBC: 4.97 x10E6/uL (ref 3.77–5.28)
RDW: 12.8 % (ref 11.7–15.4)
WBC: 4.8 10*3/uL (ref 3.4–10.8)

## 2020-09-21 LAB — COMPREHENSIVE METABOLIC PANEL
ALT: 8 IU/L (ref 0–32)
AST: 15 IU/L (ref 0–40)
Albumin/Globulin Ratio: 2.5 — ABNORMAL HIGH (ref 1.2–2.2)
Albumin: 4.7 g/dL (ref 3.8–4.8)
Alkaline Phosphatase: 68 IU/L (ref 44–121)
BUN/Creatinine Ratio: 21 (ref 12–28)
BUN: 19 mg/dL (ref 8–27)
Bilirubin Total: 0.7 mg/dL (ref 0.0–1.2)
CO2: 24 mmol/L (ref 20–29)
Calcium: 9.6 mg/dL (ref 8.7–10.3)
Chloride: 103 mmol/L (ref 96–106)
Creatinine, Ser: 0.9 mg/dL (ref 0.57–1.00)
Globulin, Total: 1.9 g/dL (ref 1.5–4.5)
Glucose: 94 mg/dL (ref 65–99)
Potassium: 4.4 mmol/L (ref 3.5–5.2)
Sodium: 140 mmol/L (ref 134–144)
Total Protein: 6.6 g/dL (ref 6.0–8.5)
eGFR: 72 mL/min/{1.73_m2} (ref >59)

## 2020-09-21 LAB — TSH: TSH: 1.82 u[IU]/mL (ref 0.450–4.500)

## 2020-09-22 LAB — CYTOLOGY - PAP
Comment: NEGATIVE
Diagnosis: NEGATIVE
High risk HPV: NEGATIVE

## 2020-10-12 ENCOUNTER — Encounter: Payer: Self-pay | Admitting: Internal Medicine

## 2020-10-31 ENCOUNTER — Ambulatory Visit
Admission: RE | Admit: 2020-10-31 | Discharge: 2020-10-31 | Disposition: A | Discharge status: Home / Self Care | Payer: 59 | Source: Ambulatory Visit | Attending: Internal Medicine | Admitting: Internal Medicine

## 2020-10-31 ENCOUNTER — Ambulatory Visit
Admission: RE | Admit: 2020-10-31 | Discharge: 2020-10-31 | Disposition: A | Discharge status: Home / Self Care | Payer: 59 | Attending: Internal Medicine | Admitting: Internal Medicine

## 2020-10-31 ENCOUNTER — Other Ambulatory Visit: Payer: Self-pay

## 2020-10-31 ENCOUNTER — Ambulatory Visit (INDEPENDENT_AMBULATORY_CARE_PROVIDER_SITE_OTHER): Payer: 59 | Admitting: Internal Medicine

## 2020-10-31 ENCOUNTER — Encounter: Payer: Self-pay | Admitting: Internal Medicine

## 2020-10-31 VITALS — BP 152/98 | HR 92 | Temp 98.3°F | Ht 64.0 in | Wt 161.0 lb

## 2020-10-31 DIAGNOSIS — R059 Cough, unspecified: Secondary | ICD-10-CM

## 2020-10-31 MED ORDER — HYDROCODONE BIT-HOMATROP MBR 5-1.5 MG/5ML PO SOLN: 5.0000 mL | Freq: Four times a day (QID) | ORAL | 0 refills | Status: AC | PRN

## 2020-10-31 MED ORDER — OMEPRAZOLE 40 MG PO CPDR: 40.0000 mg | DELAYED_RELEASE_CAPSULE | Freq: Every day | ORAL | 0 refills | Status: DC

## 2020-10-31 NOTE — Patient Instructions (Signed)
Take Omeprazole every day.  Use Hydrocodone cough syrup mainly at night - it will make you sleepy so you can use Dayquil during the day instead  One inhalation of Trelegy daily  Call in [place] to report your progress.

## 2020-10-31 NOTE — Progress Notes (Signed)
Date:  10/31/2020   Name:  Desiree Grant   DOB:  November 03, 1955   MRN:  [id number]   Chief Complaint: Nasal Congestion (Patient c/o cough with congestion. Body aches from cough. Drinking Nyquil to help sleep at night. 3-4 months. Not getting better. No fever. Having shortness of breathe on exertion. Headache on and off on the left side of her head around her temple. )  Cough This is a chronic problem. The current episode started more than 1 month ago. The problem has been unchanged. The problem occurs every few minutes. The cough is Non-productive. Associated symptoms include headaches, postnasal drip, shortness of breath and wheezing. Pertinent negatives include no chest pain, chills, fever or heartburn.   Lab Results  Component Value Date   CREATININE 0.90 09/20/2020   BUN 19 09/20/2020   NA 140 09/20/2020   K 4.4 09/20/2020   CL 103 09/20/2020   CO2 24 09/20/2020   Lab Results  Component Value Date   CHOL 270 (H) 09/20/2020   HDL 72 09/20/2020   LDLCALC 190 (H) 09/20/2020   TRIG 56 09/20/2020   CHOLHDL 3.8 09/20/2020   Lab Results  Component Value Date   TSH 1.820 09/20/2020   No results found for: HGBA1C Lab Results  Component Value Date   WBC 4.8 09/20/2020   HGB 14.1 09/20/2020   HCT 42.9 09/20/2020   MCV 86 09/20/2020   PLT 182 09/20/2020   Lab Results  Component Value Date   ALT 8 09/20/2020   AST 15 09/20/2020   ALKPHOS 68 09/20/2020   BILITOT 0.7 09/20/2020     Review of Systems  Constitutional:  Negative for chills and fever.  HENT:  Positive for postnasal drip.   Respiratory:  Positive for cough, shortness of breath and wheezing.   Cardiovascular:  Negative for chest pain.  Gastrointestinal:  Negative for heartburn.  Neurological:  Positive for headaches.   Patient Active Problem List   Diagnosis Date Noted   Other mixed anxiety disorders 03/13/2020   Lipoma of right lower extremity 03/08/2020   Lumbar degenerative disc disease 11/02/2019    Gastroesophageal reflux disease without esophagitis 09/10/2018   Neoplasm of uncertain behavior of skin 09/13/2016   Muscle cramp, nocturnal 09/13/2016   Anxiety and depression 03/22/2016   Adenomatous colon polyp 02/07/2015   Hyperlipidemia, mild 02/06/2015   Fatigue 10/26/2014   Fibrocystic breast changes 10/26/2014   Depression, major, recurrent, moderate ([place]) 10/26/2014    Allergies  Allergen Reactions   Niacin And Related Hives    blisters    Past Surgical History:  Procedure Laterality Date   CATARACT EXTRACTION W/PHACO Right 09/02/2017   Procedure: CATARACT EXTRACTION PHACO AND INTRAOCULAR LENS PLACEMENT ([place]) RIGHT SYMFONY TORIC;  Surgeon: Leandrew Koyanagi, MD;  Location: [place];  Service: Ophthalmology;  Laterality: Right;   CATARACT EXTRACTION W/PHACO Left 10/30/2017   Procedure: CATARACT EXTRACTION PHACO AND INTRAOCULAR LENS PLACEMENT ([place])  LEFT SYMFONY LENS;  Surgeon: Leandrew Koyanagi, MD;  Location: [place];  Service: Ophthalmology;  Laterality: Left;   COLONOSCOPY W/ POLYPECTOMY  2012   tubular adenoma 0.5cm was removed from descending colon   FACIAL COSMETIC SURGERY  2015   PARTIAL HYSTERECTOMY     bleeding; cervix and ovary remains    Social History   Tobacco Use   Smoking status: Never   Smokeless tobacco: Never  Vaping Use   Vaping Use: Never used  Substance Use Topics   Alcohol use: No  Alcohol/week: 0.0 standard drinks   Drug use: No     Medication list has been reviewed and updated.  No outpatient medications have been marked as taking for the 10/31/20 encounter (Office Visit) with Glean Hess, MD.    [facility] 2/9 Scores 10/31/2020 09/20/2020 04/25/2020 03/08/2020  PHQ - 2 Score 3 0 0 6  PHQ- 9 Score 8 6 0 20    GAD 7 : Generalized Anxiety Score 10/31/2020 09/20/2020 04/25/2020 03/08/2020  Nervous, Anxious, on Edge 0 3 0 1  Control/stop worrying 2 3 0 3  Worry too much - different things 2 3 0 2  Trouble  relaxing 0 1 0 2  Restless 0 0 0 1  Easily annoyed or irritable 1 1 0 1  Afraid - awful might happen 2 0 0 3  Total GAD 7 Score 7 11 0 13  Anxiety Difficulty - Somewhat difficult - Somewhat difficult    BP Readings from Last 3 Encounters:  10/31/20 (!) 152/98  09/20/20 126/78  03/08/20 108/80    Physical Exam Constitutional:      General: She is not in acute distress.    Appearance: Normal appearance. She is not ill-appearing.  Cardiovascular:     Rate and Rhythm: Normal rate and regular rhythm.     Pulses: Normal pulses.  Pulmonary:     Effort: Pulmonary effort is normal.     Breath sounds: Normal breath sounds. No stridor. No wheezing or rhonchi.     Comments: Frequent dry cough Musculoskeletal:        General: Normal range of motion.     Cervical back: Normal range of motion.  Lymphadenopathy:     Cervical: No cervical adenopathy.  Neurological:     Mental Status: She is alert.    Wt Readings from Last 3 Encounters:  10/31/20 161 lb (73 kg)  09/20/20 172 lb (78 kg)  04/25/20 160 lb (72.6 kg)    BP (!) 152/98 (BP Location: Right Arm, Patient Position: Sitting, Cuff Size: Normal)   Pulse 92   Temp 98.3 F (36.8 C) (Oral)   Ht 5\' 4"  (1.626 m)   Wt 161 lb (73 kg)   SpO2 97%   BMI 27.64 kg/m   Assessment and Plan: 1. Cough in adult DDx includes adult onset asthma, LPR, allergic cough, etc If CXR is not completely normal will want to refer to Pulmonary; otherwise trial of PPI and Trelegy 100 mg - sample given Hycodan at night to control cough She is instructed to contact me in one week to report progress - DG Chest 2 View; Future - omeprazole (PRILOSEC) 40 MG capsule; Take 1 capsule (40 mg total) by mouth daily.  Dispense: 30 capsule; Refill: 0 - HYDROcodone bit-homatropine (HYCODAN) 5-1.5 MG/5ML syrup; Take 5 mLs by mouth every 6 (six) hours as needed for up to 10 days for cough.  Dispense: 120 mL; Refill: 0   Partially dictated using Editor, commissioning. Any  errors are unintentional.  Halina Maidens, MD [facility]  10/31/2020

## 2020-11-07 ENCOUNTER — Encounter: Payer: Self-pay | Admitting: Internal Medicine

## 2020-11-08 ENCOUNTER — Other Ambulatory Visit: Payer: Self-pay

## 2020-11-09 ENCOUNTER — Other Ambulatory Visit: Payer: Self-pay

## 2020-11-09 MED ORDER — TRELEGY ELLIPTA 100-62.5-25 MCG/INH IN AEPB: 1.0000 | INHALATION_SPRAY | Freq: Every day | RESPIRATORY_TRACT | 1 refills | Status: DC

## 2020-11-27 ENCOUNTER — Other Ambulatory Visit: Payer: Self-pay | Admitting: Internal Medicine

## 2020-11-27 DIAGNOSIS — R059 Cough, unspecified: Secondary | ICD-10-CM

## 2020-11-27 NOTE — Telephone Encounter (Signed)
Requested Prescriptions  Pending Prescriptions Disp Refills  . omeprazole (PRILOSEC) 40 MG capsule [Pharmacy Med Name: OMEPRAZOLE '40MG'$  CAPSULES] 90 capsule 2    Sig: TAKE 1 CAPSULE(40 MG) BY MOUTH DAILY     Gastroenterology: Proton Pump Inhibitors Passed - 11/27/2020  7:15 AM      Passed - Valid encounter within last 12 months    Recent Outpatient Visits          3 weeks ago Cough in adult   [facility] Glean Hess, MD   2 months ago Annual physical exam   [facility] Glean Hess, MD   7 months ago [facility] Glean Hess, MD   8 months ago Depression, major, recurrent, moderate [facility])   [facility] Glean Hess, MD   9 months ago Depression, major, recurrent, moderate [facility])   [facility] Glean Hess, MD      Future Appointments            In 10 months Glean Hess, MD [facility], [facility]            '

## 2020-12-01 ENCOUNTER — Encounter: Payer: Self-pay | Admitting: Internal Medicine

## 2020-12-01 ENCOUNTER — Other Ambulatory Visit: Payer: Self-pay | Admitting: Internal Medicine

## 2020-12-01 DIAGNOSIS — R059 Cough, unspecified: Secondary | ICD-10-CM

## 2020-12-05 ENCOUNTER — Other Ambulatory Visit: Payer: Self-pay

## 2020-12-05 ENCOUNTER — Ambulatory Visit: Payer: 59 | Admitting: Internal Medicine

## 2020-12-05 ENCOUNTER — Encounter: Payer: Self-pay | Admitting: Internal Medicine

## 2020-12-05 VITALS — BP 158/84 | HR 99 | Temp 99.0°F | Ht 64.0 in | Wt 157.0 lb

## 2020-12-05 DIAGNOSIS — R059 Cough, unspecified: Secondary | ICD-10-CM | POA: Diagnosis not present

## 2020-12-05 MED ORDER — ALBUTEROL SULFATE HFA 108 (90 BASE) MCG/ACT IN AERS: 2.0000 | INHALATION_SPRAY | Freq: Four times a day (QID) | RESPIRATORY_TRACT | 0 refills | Status: DC | PRN

## 2020-12-05 MED ORDER — HYDROCODONE BIT-HOMATROP MBR 5-1.5 MG/5ML PO SOLN: 5.0000 mL | Freq: Four times a day (QID) | ORAL | 0 refills | Status: AC | PRN

## 2020-12-05 MED ORDER — PREDNISONE 10 MG PO TABS: 10.0000 mg | ORAL_TABLET | ORAL | 0 refills | Status: AC

## 2020-12-05 MED ORDER — AMOXICILLIN-POT CLAVULANATE 875-125 MG PO TABS: 1.0000 | ORAL_TABLET | Freq: Two times a day (BID) | ORAL | 0 refills | Status: AC

## 2020-12-05 NOTE — Progress Notes (Signed)
Date:  12/05/2020   Name:  Desiree Grant   DOB:  Nov 09, 1955   MRN:  [id number]   Chief Complaint: Cough (8 months getting worse over the past 2-3 months, at home covid test negative x1 week ago, chills, headache, right ear pain, runny nose, fever, sob, wheezing)  Cough This is a new problem. The current episode started more than 1 month ago. The problem has been gradually worsening. The problem occurs constantly. The cough is Productive of brown sputum. Associated symptoms include chills, ear pain, headaches, postnasal drip, shortness of breath and wheezing. Pertinent negatives include no chest pain, fever or sore throat. The symptoms are aggravated by other. She has tried steroid inhaler and OTC cough suppressant for the symptoms. The treatment provided no relief.   Lab Results  Component Value Date   CREATININE 0.90 09/20/2020   BUN 19 09/20/2020   NA 140 09/20/2020   K 4.4 09/20/2020   CL 103 09/20/2020   CO2 24 09/20/2020   Lab Results  Component Value Date   CHOL 270 (H) 09/20/2020   HDL 72 09/20/2020   LDLCALC 190 (H) 09/20/2020   TRIG 56 09/20/2020   CHOLHDL 3.8 09/20/2020   Lab Results  Component Value Date   TSH 1.820 09/20/2020   No results found for: HGBA1C Lab Results  Component Value Date   WBC 4.8 09/20/2020   HGB 14.1 09/20/2020   HCT 42.9 09/20/2020   MCV 86 09/20/2020   PLT 182 09/20/2020   Lab Results  Component Value Date   ALT 8 09/20/2020   AST 15 09/20/2020   ALKPHOS 68 09/20/2020   BILITOT 0.7 09/20/2020     Review of Systems  Constitutional:  Positive for chills. Negative for fever and unexpected weight change.  HENT:  Positive for ear pain, nosebleeds and postnasal drip. Negative for sore throat and trouble swallowing.   Respiratory:  Positive for cough, shortness of breath and wheezing.   Cardiovascular:  Negative for chest pain and leg swelling.  Neurological:  Positive for headaches.  Psychiatric/Behavioral:  Positive for dysphoric  mood and sleep disturbance. The patient is nervous/anxious.    Patient Active Problem List   Diagnosis Date Noted   Other mixed anxiety disorders 03/13/2020   Lipoma of right lower extremity 03/08/2020   Lumbar degenerative disc disease 11/02/2019   Gastroesophageal reflux disease without esophagitis 09/10/2018   Neoplasm of uncertain behavior of skin 09/13/2016   Muscle cramp, nocturnal 09/13/2016   Anxiety and depression 03/22/2016   Adenomatous colon polyp 02/07/2015   Hyperlipidemia, mild 02/06/2015   Fatigue 10/26/2014   Fibrocystic breast changes 10/26/2014   Depression, major, recurrent, moderate ([place]) 10/26/2014    Allergies  Allergen Reactions   Niacin And Related Hives    blisters    Past Surgical History:  Procedure Laterality Date   CATARACT EXTRACTION W/PHACO Right 09/02/2017   Procedure: CATARACT EXTRACTION PHACO AND INTRAOCULAR LENS PLACEMENT ([place]) RIGHT SYMFONY TORIC;  Surgeon: Leandrew Koyanagi, MD;  Location: [place];  Service: Ophthalmology;  Laterality: Right;   CATARACT EXTRACTION W/PHACO Left 10/30/2017   Procedure: CATARACT EXTRACTION PHACO AND INTRAOCULAR LENS PLACEMENT ([place])  LEFT SYMFONY LENS;  Surgeon: Leandrew Koyanagi, MD;  Location: [place];  Service: Ophthalmology;  Laterality: Left;   COLONOSCOPY W/ POLYPECTOMY  2012   tubular adenoma 0.5cm was removed from descending colon   FACIAL COSMETIC SURGERY  2015   PARTIAL HYSTERECTOMY     bleeding; cervix and ovary remains  Social History   Tobacco Use   Smoking status: Never   Smokeless tobacco: Never  Vaping Use   Vaping Use: Never used  Substance Use Topics   Alcohol use: No    Alcohol/week: 0.0 standard drinks   Drug use: No     Medication list has been reviewed and updated.  Current Meds  Medication Sig   escitalopram (LEXAPRO) 10 MG tablet Take 10 mg by mouth daily.   Fluticasone-Umeclidin-Vilant (TRELEGY ELLIPTA) 100-62.5-25 MCG/INH AEPB Inhale 1  puff into the lungs daily.   traZODone (DESYREL) 100 MG tablet Take 100-200 mg by mouth at bedtime as needed.   venlafaxine XR (EFFEXOR-XR) 150 MG 24 hr capsule Take 150 mg by mouth every morning.    PHQ 2/9 Scores 10/31/2020 09/20/2020 04/25/2020 03/08/2020  PHQ - 2 Score 3 0 0 6  PHQ- 9 Score 8 6 0 20    GAD 7 : Generalized Anxiety Score 10/31/2020 09/20/2020 04/25/2020 03/08/2020  Nervous, Anxious, on Edge 0 3 0 1  Control/stop worrying 2 3 0 3  Worry too much - different things 2 3 0 2  Trouble relaxing 0 1 0 2  Restless 0 0 0 1  Easily annoyed or irritable 1 1 0 1  Afraid - awful might happen 2 0 0 3  Total GAD 7 Score 7 11 0 13  Anxiety Difficulty - Somewhat difficult - Somewhat difficult    BP Readings from Last 3 Encounters:  12/05/20 (!) 158/84  10/31/20 (!) 152/98  09/20/20 126/78    Physical Exam Constitutional:      Appearance: Normal appearance.  Cardiovascular:     Rate and Rhythm: Regular rhythm. Tachycardia present.     Pulses: Normal pulses.  Pulmonary:     Effort: Pulmonary effort is normal.     Breath sounds: No wheezing or rhonchi.     Comments: Refrequent hard but loose cough Musculoskeletal:     Cervical back: Normal range of motion.  Lymphadenopathy:     Cervical: No cervical adenopathy.  Neurological:     Mental Status: She is alert.    Wt Readings from Last 3 Encounters:  12/05/20 157 lb (71.2 kg)  10/31/20 161 lb (73 kg)  09/20/20 172 lb (78 kg)    BP (!) 158/84   Pulse 99   Temp 99 F (37.2 C) (Oral)   Ht '5\' 4"'$  (1.626 m)   Wt 157 lb (71.2 kg)   SpO2 95%   BMI 26.95 kg/m   Assessment and Plan: 1. Cough in adult Chronic cough that was improving with Trelegy She now thinks that she has mold in her home - this was treated but no testing was done. Cough much worse over the past week - Referred to Pulmonary - HYDROcodone bit-homatropine (HYCODAN) 5-1.5 MG/5ML syrup; Take 5 mLs by mouth every 6 (six) hours as needed for up to 10 days  for cough.  Dispense: 120 mL; Refill: 0 - amoxicillin-clavulanate (AUGMENTIN) 875-125 MG tablet; Take 1 tablet by mouth 2 (two) times daily for 10 days.  Dispense: 20 tablet; Refill: 0 - albuterol (VENTOLIN HFA) 108 (90 Base) MCG/ACT inhaler; Inhale 2 puffs into the lungs every 6 (six) hours as needed for wheezing or shortness of breath.  Dispense: 1 each; Refill: 0 - predniSONE (DELTASONE) 10 MG tablet; Take 1 tablet (10 mg total) by mouth as directed for 6 days. Take 6,5,4,3,2,1 then stop  Dispense: 21 tablet; Refill: 0   Partially dictated using Editor, commissioning. Any errors  are unintentional.  Halina Maidens, MD [facility]  12/05/2020

## 2020-12-11 ENCOUNTER — Encounter: Payer: Self-pay | Admitting: Internal Medicine

## 2020-12-12 ENCOUNTER — Telehealth: Payer: Self-pay

## 2020-12-12 NOTE — Telephone Encounter (Signed)
-----   Message from Glean Hess, MD sent at 12/11/2020  5:44 PM EDT ----- Can you please check on the status of her pulmonary referral.  It was placed 11 days ago and she still does not have an appointment.  And she is still coughing. thanks

## 2020-12-12 NOTE — Telephone Encounter (Deleted)
-----   Message from Glean Hess, MD sent at 12/11/2020  5:44 PM EDT ----- Can you please check on the status of her pulmonary referral.  It was placed 11 days ago and she still does not have an appointment.  And she is still coughing. thanks

## 2020-12-12 NOTE — Telephone Encounter (Addendum)
Spoke to Parke Poisson hse stated that the office is supposed to call the pt to schedule an appt today.  KP ----- Message from Glean Hess, MD sent at 12/11/2020  5:44 PM EDT ----- Can you please check on the status of her pulmonary referral.  It was placed 11 days ago and she still does not have an appointment.  And she is still coughing. thanks

## 2020-12-12 NOTE — Telephone Encounter (Signed)
[place] she stated they the office stated they would call her today to schedule.  KP

## 2020-12-14 ENCOUNTER — Encounter: Payer: Self-pay | Admitting: Internal Medicine

## 2020-12-14 ENCOUNTER — Other Ambulatory Visit: Payer: Self-pay | Admitting: Internal Medicine

## 2020-12-14 MED ORDER — TRELEGY ELLIPTA 100-62.5-25 MCG/INH IN AEPB: 1.0000 | INHALATION_SPRAY | Freq: Every day | RESPIRATORY_TRACT | 0 refills | Status: DC

## 2020-12-14 NOTE — Telephone Encounter (Signed)
Pt called in and explained when she had the last refill, 3 were sent over, but it cost $180 and she could not get it. Pt asked if just one of the refills could be sent over so she can get it. Please advise.   Fluticasone-Umeclidin-Vilant (TRELEGY ELLIPTA) 100-62.5-25 MCG/INH AEPB   [facility], [address] AT [address]  [place], [place] [address]  Phone:  [phone]  Fax:  [phone]

## 2020-12-14 NOTE — Telephone Encounter (Signed)
  Notes to clinic:  Patient request for just one to be sent over because it cost to much to get 3 at one time  Medication not assigned to a protocol, review manually  Requested Prescriptions  Pending Prescriptions Disp Refills   Fluticasone-Umeclidin-Vilant (TRELEGY ELLIPTA) 100-62.5-25 MCG/INH AEPB 1 each 0    Sig: Inhale 1 puff into the lungs daily.      Off-Protocol Failed - 12/14/2020 11:34 AM      Failed - Medication not assigned to a protocol, review manually.      Passed - Valid encounter within last 12 months    Recent Outpatient Visits           1 week ago Cough in adult   [facility] Glean Hess, MD   1 month ago Cough in adult   [facility] Glean Hess, MD   2 months ago Annual physical exam   [facility] Glean Hess, MD   7 months ago [facility] Glean Hess, MD   9 months ago Depression, major, recurrent, moderate [facility])   [facility] Glean Hess, MD       Future Appointments             In 9 months Army Melia Jesse Sans, MD [facility], [facility]

## 2020-12-24 LAB — FECAL OCCULT BLOOD, IMMUNOCHEMICAL: Fecal Occult Bld: NEGATIVE

## 2020-12-27 ENCOUNTER — Other Ambulatory Visit: Payer: Self-pay | Admitting: Internal Medicine

## 2020-12-27 DIAGNOSIS — R059 Cough, unspecified: Secondary | ICD-10-CM

## 2020-12-27 NOTE — Telephone Encounter (Signed)
Future visit in 9 months

## 2021-01-13 ENCOUNTER — Ambulatory Visit (INDEPENDENT_AMBULATORY_CARE_PROVIDER_SITE_OTHER): Payer: 59 | Admitting: Internal Medicine

## 2021-01-13 ENCOUNTER — Encounter: Payer: Self-pay | Admitting: Internal Medicine

## 2021-01-13 ENCOUNTER — Other Ambulatory Visit
Admission: RE | Admit: 2021-01-13 | Discharge: 2021-01-13 | Disposition: A | Discharge status: Home / Self Care | Payer: 59 | Source: Ambulatory Visit | Attending: Internal Medicine | Admitting: Internal Medicine

## 2021-01-13 ENCOUNTER — Other Ambulatory Visit: Payer: Self-pay

## 2021-01-13 DIAGNOSIS — J45991 Cough variant asthma: Secondary | ICD-10-CM | POA: Insufficient documentation

## 2021-01-13 LAB — CBC WITH DIFFERENTIAL/PLATELET
Abs Immature Granulocytes: 0.02 10*3/uL (ref 0.00–0.07)
Basophils Absolute: 0.1 10*3/uL (ref 0.0–0.1)
Basophils Relative: 1 %
Eosinophils Absolute: 0.1 10*3/uL (ref 0.0–0.5)
Eosinophils Relative: 2 %
HCT: 39.4 % (ref 36.0–46.0)
Hemoglobin: 13.5 g/dL (ref 12.0–15.0)
Immature Granulocytes: 0 %
Lymphocytes Relative: 24 %
Lymphs Abs: 1.1 10*3/uL (ref 0.7–4.0)
MCH: 29.3 pg (ref 26.0–34.0)
MCHC: 34.3 g/dL (ref 30.0–36.0)
MCV: 85.7 fL (ref 80.0–100.0)
Monocytes Absolute: 0.2 10*3/uL (ref 0.1–1.0)
Monocytes Relative: 5 %
Neutro Abs: 3.1 10*3/uL (ref 1.7–7.7)
Neutrophils Relative %: 68 %
Platelets: 162 10*3/uL (ref 150–400)
RBC: 4.6 MIL/uL (ref 3.87–5.11)
RDW: 12.9 % (ref 11.5–15.5)
WBC: 4.5 10*3/uL (ref 4.0–10.5)
nRBC: 0 % (ref 0.0–0.2)

## 2021-01-13 MED ORDER — BUDESONIDE-FORMOTEROL FUMARATE 80-4.5 MCG/ACT IN AERO: INHALATION_SPRAY | RESPIRATORY_TRACT | 12 refills | Status: DC

## 2021-01-13 NOTE — Progress Notes (Signed)
Desiree Grant, female    DOB: 1955/11/27,   MRN: [id number]   Brief patient profile:  61 yowf never smoker from [place] since middle school bothered by urge to clear throat   worse  since winter of 2022 and then worst ever since early summer = more productive rx by  PCP with prednisone, amox, and started trelegy 100 and prn ventolin and "80% better" since cleaned up mold from her house but still daily cough so referred to pulmonary clinic in [facility]  01/13/2021 by Dr   [place] Army Melia     History of Present Illness  01/13/2021  Pulmonary/ 1st office eval/ Ashaz Robling / [employer]  Chief Complaint  Patient presents with   Consult    Cough for 8-9 months had mold in house  Dyspnea:  Usually not limited / some problem with steps but not bad enough to stop. Cough: sporadic worse in afternoons   thick light yellow mucus / not noct  Sleep: flat bed one pillow SABA use: not used in 48 h  No obvious day to day or daytime variability or assoc  mucus plugs or hemoptysis or cp or chest tightness, subjective wheeze or overt sinus or hb symptoms.   Sleeping as above  without nocturnal  or early am exacerbation  of respiratory  c/o's or need for noct saba. Also denies any obvious fluctuation of symptoms with weather or environmental changes or other aggravating or alleviating factors except as outlined above   No unusual exposure hx or h/o childhood pna/ asthma or knowledge of premature birth.  Current Allergies, Complete Past Medical History, Past Surgical History, Family History, and Social History were reviewed in [employer] record.  ROS  The following are not active complaints unless bolded Hoarseness, sore throat, dysphagia, dental problems, itching, sneezing,  nasal congestion or discharge of excess mucus or purulent secretions, ear ache,   fever, chills, sweats, unintended wt loss or wt gain, classically pleuritic or exertional cp,  orthopnea pnd or arm/hand  swelling  or leg swelling, presyncope, palpitations, abdominal pain, anorexia, nausea, vomiting, diarrhea  or change in bowel habits or change in bladder habits, change in stools or change in urine, dysuria, hematuria,  rash, arthralgias, visual complaints, headache, numbness, weakness or ataxia or problems with walking or coordination,  change in mood or  memory.           Past Medical History:  Diagnosis Date   Bowel trouble 2010   Depression, major, recurrent, moderate ([place]) 10/26/2014   Hyperlipidemia, mild 02/06/2015   TC 218 TG 66 HDL 60  LDL 146    Motion sickness    boats   Shoulder strain, right, initial encounter 09/13/2016   Special screening for malignant neoplasms, colon 2012    Outpatient Medications Prior to Visit  Medication Sig Dispense Refill   albuterol (VENTOLIN HFA) 108 (90 Base) MCG/ACT inhaler INHALE 2 PUFFS INTO THE LUNGS EVERY 6 HOURS AS NEEDED FOR WHEEZING OR SHORTNESS OF BREATH 1 each 0   dextromethorphan-guaiFENesin (MUCINEX DM) 30-600 MG 12hr tablet Take 1 tablet by mouth 2 (two) times daily.     escitalopram (LEXAPRO) 10 MG tablet Take 10 mg by mouth daily.     Fluticasone-Umeclidin-Vilant (TRELEGY ELLIPTA) 100-62.5-25 MCG/INH AEPB Inhale 1 puff into the lungs daily. 1 each 0   loratadine (CLARITIN) 10 MG tablet Take 10 mg by mouth daily.     traZODone (DESYREL) 100 MG tablet Take 100-200 mg by mouth at  bedtime as needed.     venlafaxine XR (EFFEXOR-XR) 150 MG 24 hr capsule Take 150 mg by mouth every morning.     No facility-administered medications prior to visit.     Objective:     BP 120/78 (BP Location: Left Arm, Patient Position: Sitting, Cuff Size: Normal)   Pulse 84   Temp 98.1 F (36.7 C) (Oral)   Ht '5\' 4"'$  (1.626 m)   Wt 156 lb 6.4 oz (70.9 kg)   SpO2 98%   BMI 26.85 kg/m   SpO2: 98 %  Pleasant amb wf nad / occ throat clearing but dry sounding with some grinding of the airway    HEENT : pt wearing mask not removed for exam due to covid  -19 concerns.    NECK :  without JVD/Nodes/TM/ nl carotid upstrokes bilaterally   LUNGS: no acc muscle use,  Nl contour chest which is clear to A and P bilaterally without cough on insp or exp maneuvers   CV:  RRR  no s3 or murmur or increase in P2, and no edema   ABD:  soft and nontender with nl inspiratory excursion in the supine position. No bruits or organomegaly appreciated, bowel sounds nl  MS:  Nl gait/ ext warm without deformities, calf tenderness, cyanosis or clubbing No obvious joint restrictions   SKIN: warm and dry without lesions    NEURO:  alert, approp, nl sensorium with  no motor or cerebellar deficits apparent.        I personally reviewed images and agree with radiology impression as follows:  CXR:   10/31/20  No active cardiopulmonary disease    Assessment   Cough variant asthma with component of UACS  Onset in Middle school - improved p rx with trelegy 100 summer of 2020 but still present daily 01/13/2021  - 01/13/2021  After extensive coaching inhaler device,  effectiveness =    75% with hfa so try change to symbiocrt 80 2bid and max rx for gerd  - Allergy profile 01/13/2021 >  Eos 0.1 /  IgE  Pending   Improvement on trelegy suggests this is asthma but based on tickle in throat since middle school s noct flares strongly suggests component of Upper airway cough syndrome (previously labeled PNDS),  is so named because it's frequently impossible to sort out how much is  CR/sinusitis with freq throat clearing (which can be related to primary GERD)   vs  causing  secondary (" extra esophageal")  GERD from wide swings in gastric pressure that occur with throat clearing, often  promoting self use of mint and menthol lozenges that reduce the lower esophageal sphincter tone and exacerbate the problem further in a cyclical fashion.   These are the same pts (now being labeled as having "irritable larynx syndrome" by some cough centers) who not infrequently have a history of  having failed to tolerate ace inhibitors,  dry powder inhalers (eg trelegy) or biphosphonates or report having atypical/extraesophageal reflux symptoms that don't respond to standard doses of PPI  and are easily confused as having aecopd or asthma flares by even experienced allergists/ pulmonologists (myself included).   >>> Rec  low dose ics in hfa form = symbicort 80 or dulera 100 trial and max rx for gerd x 6 weeks plus 1st gen H1 blockers per guidelines   then regroup with all meds in hand using a trust but verify approach to confirm accurate Medication  Reconciliation. The principal here is that until we are  certain that the  patients are doing what we've asked, it makes no sense to ask them to do more.           Each maintenance medication was reviewed in detail including emphasizing most importantly the difference between maintenance and prns and under what circumstances the prns are to be triggered using an action plan format where appropriate.  Total time for H and P, chart review, counseling, reviewing hfa device(s) and generating customized AVS unique to this office visit / same day charting = 45 min           Christinia Gully, MD 01/13/2021

## 2021-01-13 NOTE — Patient Instructions (Signed)
Stop trelegy start Symbicort 80 Take 2 puffs first thing in am and then another 2 puffs about 12 hours later.   Work on inhaler technique:  relax and gently blow all the way out then take a nice smooth full deep breath back in, triggering the inhaler at same time you start breathing in.  Hold for up to 5 seconds if you can. Blow out thru nose. Rinse and gargle with water when done.  If mouth or throat bother you at all,  try brushing teeth/gums/tongue with arm and hammer toothpaste/ make a slurry and gargle and spit out.        Only use your albuterol as a rescue medication to be used if you can't catch your breath by resting or doing a relaxed purse lip breathing pattern.  - The less you use it, the better it will work when you need it. - Ok to use up to 2 puffs  every 4 hours if you must but call for immediate appointment if use goes up over your usual need - Don't leave home without it !!  (think of it like the spare tire for your car)   For cough > mucinex dm 1200 mg every hours as needed   Try prilosec otc '20mg'$  (omeprazole) Take 30-60 min before first meal of the day and Pepcid ac (famotidine) 20 mg one @  after supper  until return   GERD (REFLUX)  is an extremely common cause of respiratory symptoms just like yours , many times with no obvious heartburn at all.    It can be treated with medication, but also with lifestyle changes including elevation of the head of your bed (ideally with 6 -8inch blocks under the headboard of your bed),  Smoking cessation, avoidance of late meals, excessive alcohol, and avoid fatty foods, chocolate, peppermint, colas, red wine, and acidic juices such as orange juice.  NO MINT OR MENTHOL PRODUCTS SO NO COUGH DROPS  USE SUGARLESS CANDY INSTEAD (Jolley ranchers or Stover's or Life Savers) or even ice chips will also do - the key is to swallow to prevent all throat clearing. NO OIL BASED VITAMINS - use powdered substitutes.  Avoid fish oil when coughing.     Please remember to go to the lab department   for your tests - we will call you with the results when they are available.      Instead of clariton >> For drainage / throat tickle try take CHLORPHENIRAMINE  4 mg  (Chlortab '4mg'$   at [employer] should be easiest to find in the green box)  take one every 4 hours as needed - available over the counter- may cause drowsiness so start with just a dose or two an hour before bedtime and see how you tolerate it before trying in daytime    Please schedule a follow up office visit in 6 weeks, call sooner if needed

## 2021-01-14 ENCOUNTER — Encounter: Payer: Self-pay | Admitting: Internal Medicine

## 2021-01-14 NOTE — Assessment & Plan Note (Addendum)
Onset in Middle school - improved p rx with trelegy 100 summer of 2020 but still present daily 01/13/2021  - 01/13/2021  After extensive coaching inhaler device,  effectiveness =    75% with hfa so try change to symbiocrt 80 2bid and max rx for gerd  - Allergy profile 01/13/2021 >  Eos 0.1 /  IgE  Pending   Improvement on trelegy suggests this is asthma but based on tickle in throat since middle school s noct flares strongly suggests component of Upper airway cough syndrome (previously labeled PNDS),  is so named because it's frequently impossible to sort out how much is  CR/sinusitis with freq throat clearing (which can be related to primary GERD)   vs  causing  secondary (" extra esophageal")  GERD from wide swings in gastric pressure that occur with throat clearing, often  promoting self use of mint and menthol lozenges that reduce the lower esophageal sphincter tone and exacerbate the problem further in a cyclical fashion.   These are the same pts (now being labeled as having "irritable larynx syndrome" by some cough centers) who not infrequently have a history of having failed to tolerate ace inhibitors,  dry powder inhalers (eg trelegy) or biphosphonates or report having atypical/extraesophageal reflux symptoms that don't respond to standard doses of PPI  and are easily confused as having aecopd or asthma flares by even experienced allergists/ pulmonologists (myself included).   >>> Rec  low dose ics in hfa form = symbicort 80 or dulera 100 trial and max rx for gerd x 6 weeks plus 1st gen H1 blockers per guidelines   then regroup with all meds in hand using a trust but verify approach to confirm accurate Medication  Reconciliation. The principal here is that until we are certain that the  patients are doing what we've asked, it makes no sense to ask them to do more.           Each maintenance medication was reviewed in detail including emphasizing most importantly the difference between maintenance  and prns and under what circumstances the prns are to be triggered using an action plan format where appropriate.  Total time for H and P, chart review, counseling, reviewing hfa device(s) and generating customized AVS unique to this office visit / same day charting = 45 min

## 2021-01-19 LAB — IGE: IgE (Immunoglobulin E), Serum: 11 IU/mL (ref 6–495)

## 2021-01-26 MED ORDER — AZITHROMYCIN 250 MG PO TABS: 250.0000 mg | ORAL_TABLET | ORAL | 0 refills | Status: DC

## 2021-01-26 NOTE — Telephone Encounter (Signed)
z-pak 

## 2021-01-26 NOTE — Telephone Encounter (Signed)
Spoke to patient via telephone.  C/o prod cough with yellow sputum,sore/itchy throat and swollen lymph node x1.5w. Sob is baseline.  Denied f/c/s or additional sx. Fully vaccinated against covid.  No recent covid test.  She is taking Symbicort BID, Mucinex DM once daily, Prilosec once daily and Claritin once daily.  Dr. Melvyn Novas, please advise. Thanks

## 2021-01-26 NOTE — Telephone Encounter (Signed)
Spoke with the pt and notified that the rx was sent for zpack  Nothing further needed  Rx sent to her preferred pharm and she will call back if not improving

## 2021-01-27 ENCOUNTER — Other Ambulatory Visit: Payer: Self-pay | Admitting: Internal Medicine

## 2021-01-27 DIAGNOSIS — R059 Cough, unspecified: Secondary | ICD-10-CM

## 2021-01-27 NOTE — Telephone Encounter (Signed)
Medication refill

## 2021-01-27 NOTE — Telephone Encounter (Signed)
Requested medications are due for refill today yes  Requested medications are on the active medication list yes  Last refill 12/27/20  Last visit 12/05/20 (seen for cough) Seen by Dr. Melvyn Novas 01/13/21  Future visit scheduled 09/2021  Notes to clinic Unsure if was to be continued, please assess.

## 2021-02-24 ENCOUNTER — Encounter: Payer: Self-pay | Admitting: Adult Health

## 2021-02-24 ENCOUNTER — Other Ambulatory Visit: Payer: Self-pay

## 2021-02-24 ENCOUNTER — Ambulatory Visit (INDEPENDENT_AMBULATORY_CARE_PROVIDER_SITE_OTHER): Payer: 59 | Admitting: Adult Health

## 2021-02-24 VITALS — BP 134/88 | HR 86 | Temp 98.4°F | Ht 64.0 in | Wt 150.8 lb

## 2021-02-24 DIAGNOSIS — J310 Chronic rhinitis: Secondary | ICD-10-CM

## 2021-02-24 DIAGNOSIS — K219 Gastro-esophageal reflux disease without esophagitis: Secondary | ICD-10-CM

## 2021-02-24 DIAGNOSIS — J45991 Cough variant asthma: Secondary | ICD-10-CM

## 2021-02-24 MED ORDER — BENZONATATE 200 MG PO CAPS: 200.0000 mg | ORAL_CAPSULE | Freq: Three times a day (TID) | ORAL | 2 refills | Status: DC | PRN

## 2021-02-24 NOTE — Assessment & Plan Note (Signed)
Continue on GERD diet and prevention regimen

## 2021-02-24 NOTE — Progress Notes (Signed)
@Patient  ID: Desiree Grant, female    DOB: 14-Feb-1956, 65 y.o.   MRN: [id number]  Chief Complaint  Patient presents with   Follow-up    Referring provider: Glean Hess, MD  HPI: 65 year old female never smoker seen for pulmonary consult January 13, 2021 for chronic cough x 1 year.   TEST/EVENTS :  Allergy profile 01/13/2021 >  Eos 0.1 /  IgE  11  Chest x-ray October 31, 2020 clear lungs  02/24/2021 Follow up ; Chronic cough  Patient returns for a 6-week follow-up.  Patient was seen last visit for a chronic cough.  She was found to have upper airway cough syndrome versus cough variant asthma.  She was changed from Trelegy to Symbicort.  Placed on GERD and postnasal drainage regimen along with cough suppression regimen with Mucinex DM.  Previous labs showed IgE was normal at 11.  And eosinophils were 0.1.  Previous chest x-ray was clear. Since last visit patient is feeling better with decrease in her cough.  Feels that she is about 7 to 80% better.  But she still has ongoing throat clearing which is very frequent. She only took Claritin and did not begin chlor tabs.  She does feel that Symbicort is working well.      Allergies  Allergen Reactions   Niacin And Related Hives    blisters    Immunization History  Administered Date(s) Administered   Influenza,inj,Quad PF,6+ Mos 02/07/2015, 02/08/2016, 01/23/2018, 02/08/2020   Janssen (J&J) SARS-COV-2 Vaccination 08/06/2019   Moderna SARS-COV2 Booster Vaccination 12/02/2020    Past Medical History:  Diagnosis Date   Bowel trouble 2010   Depression, major, recurrent, moderate ([place]) 10/26/2014   Hyperlipidemia, mild 02/06/2015   TC 218 TG 66 HDL 60  LDL 146    Motion sickness    boats   Shoulder strain, right, initial encounter 09/13/2016   Special screening for malignant neoplasms, colon 2012    Tobacco History: Social History   Tobacco Use  Smoking Status Never  Smokeless Tobacco Never   Counseling given: Not  Answered   Outpatient Medications Prior to Visit  Medication Sig Dispense Refill   albuterol (VENTOLIN HFA) 108 (90 Base) MCG/ACT inhaler INHALE 2 PUFFS INTO THE LUNGS EVERY 6 HOURS AS NEEDED FOR WHEEZING OR SHORTNESS OF BREATH 8.5 g 1   azithromycin (ZITHROMAX) 250 MG tablet Take 1 tablet (250 mg total) by mouth as directed. 6 tablet 0   budesonide-formoterol (SYMBICORT) 80-4.5 MCG/ACT inhaler Take 2 puffs first thing in am and then another 2 puffs about 12 hours later. 1 each 12   dextromethorphan-guaiFENesin (MUCINEX DM) 30-600 MG 12hr tablet Take 1 tablet by mouth 2 (two) times daily.     escitalopram (LEXAPRO) 10 MG tablet Take 10 mg by mouth daily.     traZODone (DESYREL) 100 MG tablet Take 100-200 mg by mouth at bedtime as needed.     venlafaxine XR (EFFEXOR-XR) 150 MG 24 hr capsule Take 150 mg by mouth every morning.     No facility-administered medications prior to visit.     Review of Systems:   Constitutional:   No  weight loss, night sweats,  Fevers, chills, fatigue, or  lassitude.  HEENT:   No headaches,  Difficulty swallowing,  Tooth/dental problems, or  Sore throat,                No sneezing, itching, ear ache,  +nasal congestion, post nasal drip,   CV:  No chest pain,  Orthopnea, PND, swelling in lower extremities, anasarca, dizziness, palpitations, syncope.   GI  No heartburn, indigestion, abdominal pain, nausea, vomiting, diarrhea, change in bowel habits, loss of appetite, bloody stools.   Resp: .  No chest wall deformity  Skin: no rash or lesions.  GU: no dysuria, change in color of urine, no urgency or frequency.  No flank pain, no hematuria   MS:  No joint pain or swelling.  No decreased range of motion.  No back pain.    Physical Exam  BP 134/88 (BP Location: Right Arm, Patient Position: Sitting, Cuff Size: Normal)   Pulse 86   Temp 98.4 F (36.9 C) (Oral)   Ht 5\' 4"  (1.626 m)   Wt 150 lb 12.8 oz (68.4 kg)   SpO2 98%   BMI 25.88 kg/m   GEN:  A/Ox3; pleasant , NAD, well nourished    HEENT:  [place]/AT,  NOSE-clear, THROAT-clear, no lesions, no postnasal drip or exudate noted.   NECK:  Supple w/ fair ROM; no JVD; normal carotid impulses w/o bruits; no thyromegaly or nodules palpated; + tender glands /right sided submental/submadibular +++throat clearing   RESP  Clear  P & A; w/o, wheezes/ rales/ or rhonchi. no accessory muscle use, no dullness to percussion  CARD:  RRR, no m/r/g, no peripheral edema, pulses intact, no cyanosis or clubbing.  GI:   Soft & nt; nml bowel sounds; no organomegaly or masses detected.   Musco: Warm bil, no deformities or joint swelling noted.   Neuro: alert, no focal deficits noted.    Skin: Warm, no lesions or rashes    Lab Results:      BNP No results found for: BNP  ProBNP No results found for: PROBNP  Imaging: No results found.    No flowsheet data found.  No results found for: NITRICOXIDE      Assessment & Plan:   Cough variant asthma with component of UACS  Cough variant asthma with ongoing upper airway cough.  Patient is continue treatment aimed at GERD and allergic rhinitis prevention.  Patient is to add in chlor tabs at bedtime.  Add in [place] for cough control.  We will change Mucinex tablets to liquid to see if this is more effective for her cough prevention. Check PFTs on return. Voice rest has been encouraged.  Plan  Patient Instructions  Continue on Claritin 10mg  daily in am .  Begin Chlorpheniramine 4mg  At bedtime   Begin Mucinex DM liquid 2 tsp Twice daily  for cough /congestion  Add Tessalon Three times a day  for cough /congestion .  Continue on Prilosec and Pepcid  Sips of water to avoid throat clearing .  Continue on Symbicort 2 puffs Twice daily  , rinse after use.  Follow up with Dr. Melvyn Novas in 4 weeks with PFT and As needed   Please contact office for sooner follow up if symptoms do not improve or worsen or seek emergency care         Gastroesophageal reflux disease without esophagitis Continue on GERD diet and prevention regimen  Chronic rhinitis Continue on current regimen.  Add in chlor tabs 4 mg at bedtime.  Plan  Patient Instructions  Continue on Claritin 10mg  daily in am .  Begin Chlorpheniramine 4mg  At bedtime   Begin Mucinex DM liquid 2 tsp Twice daily  for cough /congestion  Add Tessalon Three times a day  for cough /congestion .  Continue on Prilosec and Pepcid  Sips of water to avoid  throat clearing .  Continue on Symbicort 2 puffs Twice daily  , rinse after use.  Follow up with Dr. Melvyn Novas in 4 weeks with PFT and As needed   Please contact office for sooner follow up if symptoms do not improve or worsen or seek emergency care          Rexene Edison, NP 02/24/2021

## 2021-02-24 NOTE — Assessment & Plan Note (Signed)
Continue on current regimen.  Add in chlor tabs 4 mg at bedtime.  Plan  Patient Instructions  Continue on Claritin 10mg  daily in am .  Begin Chlorpheniramine 4mg  At bedtime   Begin Mucinex DM liquid 2 tsp Twice daily  for cough /congestion  Add Tessalon Three times a day  for cough /congestion .  Continue on Prilosec and Pepcid  Sips of water to avoid throat clearing .  Continue on Symbicort 2 puffs Twice daily  , rinse after use.  Follow up with Dr. Melvyn Novas in 4 weeks with PFT and As needed   Please contact office for sooner follow up if symptoms do not improve or worsen or seek emergency care

## 2021-02-24 NOTE — Assessment & Plan Note (Signed)
Cough variant asthma with ongoing upper airway cough.  Patient is continue treatment aimed at GERD and allergic rhinitis prevention.  Patient is to add in chlor tabs at bedtime.  Add in [place] for cough control.  We will change Mucinex tablets to liquid to see if this is more effective for her cough prevention. Check PFTs on return. Voice rest has been encouraged.  Plan  Patient Instructions  Continue on Claritin 10mg  daily in am .  Begin Chlorpheniramine 4mg  At bedtime   Begin Mucinex DM liquid 2 tsp Twice daily  for cough /congestion  Add Tessalon Three times a day  for cough /congestion .  Continue on Prilosec and Pepcid  Sips of water to avoid throat clearing .  Continue on Symbicort 2 puffs Twice daily  , rinse after use.  Follow up with Dr. Melvyn Novas in 4 weeks with PFT and As needed   Please contact office for sooner follow up if symptoms do not improve or worsen or seek emergency care

## 2021-02-24 NOTE — Patient Instructions (Addendum)
Continue on Claritin 10mg  daily in am .  Begin Chlorpheniramine 4mg  At bedtime   Begin Mucinex DM liquid 2 tsp Twice daily  for cough /congestion  Add Tessalon Three times a day  for cough /congestion .  Continue on Prilosec and Pepcid  Sips of water to avoid throat clearing .  Continue on Symbicort 2 puffs Twice daily  , rinse after use.  Follow up with Dr. Melvyn Novas in 4 weeks with PFT and As needed   Please contact office for sooner follow up if symptoms do not improve or worsen or seek emergency care

## 2021-03-06 ENCOUNTER — Telehealth: Payer: Self-pay

## 2021-03-06 NOTE — Telephone Encounter (Signed)
Noted.  Will close encounter.  

## 2021-03-06 NOTE — Telephone Encounter (Signed)
Called and LVM in regards to upcoming COVID test on 11/2. Will try again later. Routing to [place] triage to ensure follow up.

## 2021-03-08 ENCOUNTER — Other Ambulatory Visit: Payer: Self-pay

## 2021-03-08 ENCOUNTER — Other Ambulatory Visit
Admission: RE | Admit: 2021-03-08 | Discharge: 2021-03-08 | Disposition: A | Discharge status: Home / Self Care | Payer: 59 | Source: Ambulatory Visit | Attending: Adult Health | Admitting: Adult Health

## 2021-03-08 DIAGNOSIS — Z20822 Contact with and (suspected) exposure to covid-19: Secondary | ICD-10-CM | POA: Insufficient documentation

## 2021-03-08 DIAGNOSIS — Z01812 Encounter for preprocedural laboratory examination: Secondary | ICD-10-CM | POA: Insufficient documentation

## 2021-03-09 ENCOUNTER — Ambulatory Visit: Payer: 59 | Attending: Adult Health

## 2021-03-09 DIAGNOSIS — J45991 Cough variant asthma: Secondary | ICD-10-CM | POA: Insufficient documentation

## 2021-03-09 LAB — SARS CORONAVIRUS 2 (TAT 6-24 HRS): SARS Coronavirus 2: NEGATIVE

## 2021-03-09 MED ORDER — ALBUTEROL SULFATE (2.5 MG/3ML) 0.083% IN NEBU
2.5000 mg | INHALATION_SOLUTION | Freq: Once | RESPIRATORY_TRACT | Status: AC
  Administered 2021-03-09: 2.5 mg via RESPIRATORY_TRACT
  Filled 2021-03-09: qty 3

## 2021-03-21 ENCOUNTER — Other Ambulatory Visit: Payer: Self-pay

## 2021-03-21 ENCOUNTER — Ambulatory Visit (INDEPENDENT_AMBULATORY_CARE_PROVIDER_SITE_OTHER): Payer: 59 | Admitting: Adult Health

## 2021-03-21 ENCOUNTER — Encounter: Payer: Self-pay | Admitting: Adult Health

## 2021-03-21 VITALS — BP 110/70 | HR 72 | Ht 64.0 in | Wt 152.0 lb

## 2021-03-21 DIAGNOSIS — J45991 Cough variant asthma: Secondary | ICD-10-CM

## 2021-03-21 DIAGNOSIS — J310 Chronic rhinitis: Secondary | ICD-10-CM | POA: Diagnosis not present

## 2021-03-21 DIAGNOSIS — R490 Dysphonia: Secondary | ICD-10-CM | POA: Diagnosis not present

## 2021-03-21 DIAGNOSIS — K219 Gastro-esophageal reflux disease without esophagitis: Secondary | ICD-10-CM | POA: Diagnosis not present

## 2021-03-21 DIAGNOSIS — R590 Localized enlarged lymph nodes: Secondary | ICD-10-CM | POA: Insufficient documentation

## 2021-03-21 NOTE — Progress Notes (Signed)
@Patient  ID: Desiree Grant, female    DOB: 17-Feb-1956, 65 y.o.   MRN: [id number]  Chief Complaint  Patient presents with   Follow-up    Referring provider: Glean Hess, MD  HPI: 65 year old female never smoker seen for pulmonary consult January 13, 2021 for chronic cough x1 year   TEST/EVENTS :   03/21/2021 Follow up : Chronic cough  Patient returns for 1 month follow-up.  Patient was seen in September for pulmonary consult for chronic cough.  She was felt to have upper airway cough syndrome versus cough variant asthma.  At initial visit she was changed from Trelegy to Symbicort.  Placed on GERD and chronic rhinitis prevention regimen along with cough suppression with Mucinex DM.  Lab work showed normal IgE at 11 and absolute eosinophil count at 100.  Chest x-ray was clear.  Patient was set up for pulmonary function testing that was done on March 09, 2021 that showed normal lung function with no airflow obstruction or restriction and a normal DLCO.  FEV1 was 118%, ratio 87, FVC 104%, no significant bronchodilator response, DLCO 125%. Last visit cough had improved but she was continued to have some ongoing mainly productive cough.  And postnasal drainage.  She was recommended to add chlor tabs 4 mg at bedtime.  And add in Tessalon to her Mucinex DM for cough suppression.  Since last visit patient says she is feeling better she has decreased cough.  Feels that her symptoms are improving. We reviewed her PFTs in detail. She denies any chest pain orthopnea PND or increased leg swelling. Complains on tender gland in neck on right . Enlarged and tender for last 3 months . No better with claritin or chlortabs. On and off hoarseness for several months .  No weight loss, or dysphagia  or GERD .     Allergies  Allergen Reactions   Niacin And Related Hives    blisters    Immunization History  Administered Date(s) Administered   Influenza,inj,Quad PF,6+ Mos 02/07/2015, 02/08/2016,  01/23/2018, 02/08/2020   Janssen (J&J) SARS-COV-2 Vaccination 08/06/2019   Moderna SARS-COV2 Booster Vaccination 12/02/2020    Past Medical History:  Diagnosis Date   Bowel trouble 2010   Depression, major, recurrent, moderate ([place]) 10/26/2014   Hyperlipidemia, mild 02/06/2015   TC 218 TG 66 HDL 60  LDL 146    Motion sickness    boats   Shoulder strain, right, initial encounter 09/13/2016   Special screening for malignant neoplasms, colon 2012    Tobacco History: Social History   Tobacco Use  Smoking Status Never  Smokeless Tobacco Never   Counseling given: Not Answered   Outpatient Medications Prior to Visit  Medication Sig Dispense Refill   albuterol (VENTOLIN HFA) 108 (90 Base) MCG/ACT inhaler INHALE 2 PUFFS INTO THE LUNGS EVERY 6 HOURS AS NEEDED FOR WHEEZING OR SHORTNESS OF BREATH 8.5 g 1   benzonatate (TESSALON) 200 MG capsule Take 1 capsule (200 mg total) by mouth 3 (three) times daily as needed for cough. 45 capsule 2   budesonide-formoterol (SYMBICORT) 80-4.5 MCG/ACT inhaler Take 2 puffs first thing in am and then another 2 puffs about 12 hours later. 1 each 12   chlorpheniramine (CHLOR-TRIMETON) 4 MG tablet Take 4 mg by mouth at bedtime.     Cyanocobalamin (VITAMIN B-12 PO) Take 1 tablet by mouth daily.     dextromethorphan-guaiFENesin (MUCINEX DM) 30-600 MG 12hr tablet Take 1 tablet by mouth 2 (two) times daily.  escitalopram (LEXAPRO) 10 MG tablet Take 10 mg by mouth daily.     Famotidine (PEPCID PO) Take 1 tablet by mouth daily.     traZODone (DESYREL) 100 MG tablet Take 100-200 mg by mouth at bedtime as needed.     venlafaxine XR (EFFEXOR-XR) 150 MG 24 hr capsule Take 150 mg by mouth every morning.     azithromycin (ZITHROMAX) 250 MG tablet Take 1 tablet (250 mg total) by mouth as directed. 6 tablet 0   No facility-administered medications prior to visit.     Review of Systems:   Constitutional:   No  weight loss, night sweats,  Fevers, chills, fatigue, or   lassitude.  HEENT:   No headaches,  Difficulty swallowing,  Tooth/dental problems, or  Sore throat,                No sneezing, itching, ear ache,  +nasal congestion, post nasal drip,   CV:  No chest pain,  Orthopnea, PND, swelling in lower extremities, anasarca, dizziness, palpitations, syncope.   GI  No heartburn, indigestion, abdominal pain, nausea, vomiting, diarrhea, change in bowel habits, loss of appetite, bloody stools.   Resp: No shortness of breath with exertion or at rest.  ,  No coughing up of blood.  No change in color of mucus.  No wheezing.  No chest wall deformity  Skin: no rash or lesions.  GU: no dysuria, change in color of urine, no urgency or frequency.  No flank pain, no hematuria   MS:  No joint pain or swelling.  No decreased range of motion.  No back pain.    Physical Exam  BP 110/70 (BP Location: Left Arm, Patient Position: Sitting, Cuff Size: Normal)   Pulse 72   Ht 5\' 4"  (1.626 m)   Wt 152 lb (68.9 kg)   SpO2 97%   BMI 26.09 kg/m   GEN: A/Ox3; pleasant , NAD, well nourished    HEENT:  [place]/AT,  EACs-clear, TMs-wnl, NOSE-clear, THROAT-clear drainage  no lesions, no postnasal drip or exudate noted.   NECK:  Supple w/ fair ROM; no JVD; normal carotid impulses w/o bruits; no thyromegaly or nodules palpated; Right anterior /posterior tender adenopathy , mobile   RESP  Clear  P & A; w/o, wheezes/ rales/ or rhonchi. no accessory muscle use, no dullness to percussion  CARD:  RRR, no m/r/g, no peripheral edema, pulses intact, no cyanosis or clubbing.  GI:   Soft & nt; nml bowel sounds; no organomegaly or masses detected.   Musco: Warm bil, no deformities or joint swelling noted.   Neuro: alert, no focal deficits noted.    Skin: Warm, no lesions or rashes    Lab Results:  CBC    Component Value Date/Time   WBC 4.5 01/13/2021 1054   RBC 4.60 01/13/2021 1054   HGB 13.5 01/13/2021 1054   HGB 14.1 09/20/2020 1125   HCT 39.4 01/13/2021 1054   HCT  42.9 09/20/2020 1125   PLT 162 01/13/2021 1054   PLT 182 09/20/2020 1125   MCV 85.7 01/13/2021 1054   MCV 86 09/20/2020 1125   MCV 82 11/15/2012 0855   MCH 29.3 01/13/2021 1054   MCHC 34.3 01/13/2021 1054   RDW 12.9 01/13/2021 1054   RDW 12.8 09/20/2020 1125   RDW 12.3 11/15/2012 0855   LYMPHSABS 1.1 01/13/2021 1054   LYMPHSABS 1.5 09/20/2020 1125   MONOABS 0.2 01/13/2021 1054   EOSABS 0.1 01/13/2021 1054   EOSABS 0.1 09/20/2020 1125  BASOSABS 0.1 01/13/2021 1054   BASOSABS 0.1 09/20/2020 1125    BMET    Component Value Date/Time   NA 140 09/20/2020 1125   NA 143 11/15/2012 0855   K 4.4 09/20/2020 1125   K 3.5 11/15/2012 0855   CL 103 09/20/2020 1125   CL 108 (H) 11/15/2012 0855   CO2 24 09/20/2020 1125   CO2 29 11/15/2012 0855   GLUCOSE 94 09/20/2020 1125   GLUCOSE 108 (H) 11/15/2012 0855   BUN 19 09/20/2020 1125   BUN 10 11/15/2012 0855   CREATININE 0.90 09/20/2020 1125   CREATININE 1.02 11/15/2012 0855   CALCIUM 9.6 09/20/2020 1125   CALCIUM 9.5 11/15/2012 0855   GFRNONAA 71 09/16/2019 0955   GFRNONAA >60 11/15/2012 0855   GFRAA 81 09/16/2019 0955   GFRAA >60 11/15/2012 0855    BNP No results found for: BNP  ProBNP No results found for: PROBNP  Imaging: Pulmonary Function Test [facility] Only  Result Date: 03/09/2021 Spirometry Data Is Acceptable and Reproducible No obvious evidence of Obstructive Airways disease or Restrictive Lung disease Consider outpatient follow up with Pulmonary if needed. Clinical Correlation Advised    albuterol (PROVENTIL) (2.5 MG/3ML) 0.083% nebulizer solution 2.5 mg     Date Action Dose Route User   03/09/2021 1118 Given 2.5 mg Nebulization [place], Amy, RRT       No flowsheet data found.  No results found for: NITRICOXIDE      Assessment & Plan:   Cough variant asthma with component of UACS  Upper airway cough syndrome -improved with treatment aimed at Rhinitis /GERD /Cough prevention  PFT normal . May have  component of RAD/Intermittent asthma  Will decrease Symbicort .  Continue current regimen.  On return visit consider  change in Prilosec and Symbicort as needed  Plan  Patient Instructions  Restart on Claritin 10mg  daily in am .  Continue Chlorpheniramine 4mg  At bedtime   Use Mucinex DM liquid 2 tsp Twice daily  for cough /congestion  Use Tessalon Three times a day  for cough /congestion .  Continue on Prilosec and Pepcid  Sips of water to avoid throat clearing .  Decrease Symbicort 2 puffs  daily  , rinse after use.  Refer to ENT for cervical adenopathy and hoarseness.  Follow up with Dr. Melvyn Novas or Victoriano Campion NP in 3 months and As needed   Please contact office for sooner follow up if symptoms do not improve or worsen or seek emergency care        Gastroesophageal reflux disease without esophagitis Continue on current treatment for now.  On return visit if doing well would consider changing Prilosec and Pepcid to as needed  Chronic rhinitis Continue on current regimen.  Would restart Claritin and continue on chlor tabs at bedtime  Cervical adenopathy Right-sided anterior posterior cervical adenopathy x3 months along with some intermittent hoarseness. Not improved with antihistamines.  Refer to ENT.     Rexene Edison, NP 03/21/2021

## 2021-03-21 NOTE — Assessment & Plan Note (Signed)
Continue on current treatment for now.  On return visit if doing well would consider changing Prilosec and Pepcid to as needed

## 2021-03-21 NOTE — Assessment & Plan Note (Signed)
Continue on current regimen.  Would restart Claritin and continue on chlor tabs at bedtime

## 2021-03-21 NOTE — Addendum Note (Signed)
Addended by: Elby Beck R on: 03/21/2021 01:14 PM   Modules accepted: Orders

## 2021-03-21 NOTE — Assessment & Plan Note (Signed)
Right-sided anterior posterior cervical adenopathy x3 months along with some intermittent hoarseness. Not improved with antihistamines.  Refer to ENT.

## 2021-03-21 NOTE — Assessment & Plan Note (Signed)
Upper airway cough syndrome -improved with treatment aimed at Rhinitis /GERD /Cough prevention  PFT normal . May have component of RAD/Intermittent asthma  Will decrease Symbicort .  Continue current regimen.  On return visit consider  change in Prilosec and Symbicort as needed  Plan  Patient Instructions  Restart on Claritin 10mg  daily in am .  Continue Chlorpheniramine 4mg  At bedtime   Use Mucinex DM liquid 2 tsp Twice daily  for cough /congestion  Use Tessalon Three times a day  for cough /congestion .  Continue on Prilosec and Pepcid  Sips of water to avoid throat clearing .  Decrease Symbicort 2 puffs  daily  , rinse after use.  Refer to ENT for cervical adenopathy and hoarseness.  Follow up with Dr. Melvyn Novas or Humberto Addo NP in 3 months and As needed   Please contact office for sooner follow up if symptoms do not improve or worsen or seek emergency care

## 2021-03-21 NOTE — Patient Instructions (Addendum)
Restart on Claritin 10mg  daily in am .  Continue Chlorpheniramine 4mg  At bedtime   Use Mucinex DM liquid 2 tsp Twice daily  for cough /congestion  Use Tessalon Three times a day  for cough /congestion .  Continue on Prilosec and Pepcid  Sips of water to avoid throat clearing .  Decrease Symbicort 2 puffs  daily  , rinse after use.  Refer to ENT for cervical adenopathy and hoarseness.  Follow up with Dr. Melvyn Novas or Reis Pienta NP in 3 months and As needed   Please contact office for sooner follow up if symptoms do not improve or worsen or seek emergency care

## 2021-04-17 ENCOUNTER — Other Ambulatory Visit: Payer: Self-pay | Admitting: Internal Medicine

## 2021-04-20 ENCOUNTER — Other Ambulatory Visit: Payer: Self-pay | Admitting: Internal Medicine

## 2021-04-21 NOTE — Telephone Encounter (Signed)
Called pt left VM to call back. Inform pt of the note above.  [facility] nurse may give results to patient if they return call to clinic, a CRM has been created.

## 2021-05-25 DIAGNOSIS — R69 Illness, unspecified: Secondary | ICD-10-CM | POA: Diagnosis not present

## 2021-05-25 DIAGNOSIS — F411 Generalized anxiety disorder: Secondary | ICD-10-CM | POA: Diagnosis not present

## 2021-05-25 DIAGNOSIS — F5105 Insomnia due to other mental disorder: Secondary | ICD-10-CM | POA: Diagnosis not present

## 2021-07-19 DIAGNOSIS — R69 Illness, unspecified: Secondary | ICD-10-CM | POA: Diagnosis not present

## 2021-07-19 DIAGNOSIS — F411 Generalized anxiety disorder: Secondary | ICD-10-CM | POA: Diagnosis not present

## 2021-07-19 DIAGNOSIS — F5105 Insomnia due to other mental disorder: Secondary | ICD-10-CM | POA: Diagnosis not present

## 2021-08-02 DIAGNOSIS — F5105 Insomnia due to other mental disorder: Secondary | ICD-10-CM | POA: Diagnosis not present

## 2021-08-02 DIAGNOSIS — F411 Generalized anxiety disorder: Secondary | ICD-10-CM | POA: Diagnosis not present

## 2021-08-02 DIAGNOSIS — R69 Illness, unspecified: Secondary | ICD-10-CM | POA: Diagnosis not present

## 2021-08-21 DIAGNOSIS — R69 Illness, unspecified: Secondary | ICD-10-CM | POA: Diagnosis not present

## 2021-08-21 DIAGNOSIS — F5105 Insomnia due to other mental disorder: Secondary | ICD-10-CM | POA: Diagnosis not present

## 2021-08-21 DIAGNOSIS — F411 Generalized anxiety disorder: Secondary | ICD-10-CM | POA: Diagnosis not present

## 2021-08-22 ENCOUNTER — Encounter: Payer: Self-pay | Admitting: Internal Medicine

## 2021-08-23 ENCOUNTER — Ambulatory Visit (INDEPENDENT_AMBULATORY_CARE_PROVIDER_SITE_OTHER): Payer: 59 | Admitting: Internal Medicine

## 2021-08-23 ENCOUNTER — Other Ambulatory Visit: Payer: Self-pay | Admitting: Internal Medicine

## 2021-08-23 ENCOUNTER — Encounter: Payer: Self-pay | Admitting: Internal Medicine

## 2021-08-23 VITALS — BP 132/86 | HR 98 | Ht 64.0 in | Wt 154.0 lb

## 2021-08-23 DIAGNOSIS — J45901 Unspecified asthma with (acute) exacerbation: Secondary | ICD-10-CM | POA: Diagnosis not present

## 2021-08-23 DIAGNOSIS — R059 Cough, unspecified: Secondary | ICD-10-CM

## 2021-08-23 MED ORDER — PROMETHAZINE-DM 6.25-15 MG/5ML PO SYRP
5.0000 mL | ORAL_SOLUTION | Freq: Four times a day (QID) | ORAL | 0 refills | Status: AC | PRN
Start: 2021-08-23 — End: 2021-09-01

## 2021-08-23 MED ORDER — CHLORPHENIRAMINE MALEATE 4 MG PO TABS: 4.0000 mg | ORAL_TABLET | Freq: Every evening | ORAL | 0 refills | Status: DC

## 2021-08-23 MED ORDER — AMOXICILLIN-POT CLAVULANATE 875-125 MG PO TABS: 1.0000 | ORAL_TABLET | Freq: Two times a day (BID) | ORAL | 0 refills | Status: AC

## 2021-08-23 NOTE — Progress Notes (Signed)
? ? ?Date:  08/23/2021  ? ?Name:  Desiree Grant   DOB:  [birth year]   MRN:  [id number] ? ? ?Chief Complaint: Cough (In the morning and or at bedtime is worst. Yellow production. Started 1.5 weeks ago. No fever. Wheezing. No Covid Test done. ) ? ?Cough ?This is a new problem. The current episode started in the past 7 days. The problem has been unchanged. The problem occurs every few minutes. The cough is Productive of sputum. Associated symptoms include ear pain. Pertinent negatives include no chest pain, chills, fever, headaches, nasal congestion, shortness of breath, weight loss or wheezing. The symptoms are aggravated by lying down. Treatments tried: claritin, mucinex. Her past medical history is significant for asthma.  ? ?Lab Results  ?Component Value Date  ? NA 140 09/20/2020  ? K 4.4 09/20/2020  ? CO2 24 09/20/2020  ? GLUCOSE 94 09/20/2020  ? BUN 19 09/20/2020  ? CREATININE 0.90 09/20/2020  ? CALCIUM 9.6 09/20/2020  ? EGFR 72 09/20/2020  ? GFRNONAA 71 09/16/2019  ? ?Lab Results  ?Component Value Date  ? CHOL 270 (H) 09/20/2020  ? HDL 72 09/20/2020  ? LDLCALC 190 (H) 09/20/2020  ? TRIG 56 09/20/2020  ? CHOLHDL 3.8 09/20/2020  ? ?Lab Results  ?Component Value Date  ? TSH 1.820 09/20/2020  ? ?No results found for: HGBA1C ?Lab Results  ?Component Value Date  ? WBC 4.5 01/13/2021  ? HGB 13.5 01/13/2021  ? HCT 39.4 01/13/2021  ? MCV 85.7 01/13/2021  ? PLT 162 01/13/2021  ? ?Lab Results  ?Component Value Date  ? ALT 8 09/20/2020  ? AST 15 09/20/2020  ? ALKPHOS 68 09/20/2020  ? BILITOT 0.7 09/20/2020  ? ?No results found for: 25OHVITD2, [place], VD25OH  ? ?Review of Systems  ?Constitutional:  Negative for chills, fatigue, fever, unexpected weight change and weight loss.  ?HENT:  Positive for ear pain. Negative for trouble swallowing.   ?Respiratory:  Positive for cough. Negative for chest tightness, shortness of breath and wheezing.   ?Cardiovascular:  Negative for chest pain and palpitations.  ?Neurological:   Negative for headaches.  ? ?Patient Active Problem List  ? Diagnosis Date Noted  ? Cervical adenopathy 03/21/2021  ? Chronic rhinitis 02/24/2021  ? Cough variant asthma with component of UACS  01/13/2021  ? Other mixed anxiety disorders 03/13/2020  ? Lipoma of right lower extremity 03/08/2020  ? Lumbar degenerative disc disease 11/02/2019  ? Gastroesophageal reflux disease without esophagitis 09/10/2018  ? Neoplasm of uncertain behavior of skin 09/13/2016  ? Muscle cramp, nocturnal 09/13/2016  ? Anxiety and depression 03/22/2016  ? Adenomatous colon polyp 02/07/2015  ? Hyperlipidemia, mild 02/06/2015  ? Fatigue 10/26/2014  ? Fibrocystic breast changes 10/26/2014  ? Depression, major, recurrent, moderate ([place]) 10/26/2014  ? ? ?Allergies  ?Allergen Reactions  ? Niacin And Related Hives  ?  blisters  ? ? ?Past Surgical History:  ?Procedure Laterality Date  ? CATARACT EXTRACTION W/PHACO Right 09/02/2017  ? Procedure: CATARACT EXTRACTION PHACO AND INTRAOCULAR LENS PLACEMENT ([place]) RIGHT SYMFONY TORIC;  Surgeon: Leandrew Koyanagi, MD;  Location: [place];  Service: Ophthalmology;  Laterality: Right;  ? CATARACT EXTRACTION W/PHACO Left 10/30/2017  ? Procedure: CATARACT EXTRACTION PHACO AND INTRAOCULAR LENS PLACEMENT ([place])  LEFT SYMFONY LENS;  Surgeon: Leandrew Koyanagi, MD;  Location: [place];  Service: Ophthalmology;  Laterality: Left;  ? COLONOSCOPY W/ POLYPECTOMY  2012  ? tubular adenoma 0.5cm was removed from descending colon  ? FACIAL COSMETIC SURGERY  2015  ? PARTIAL HYSTERECTOMY    ? bleeding; cervix and ovary remains  ? ? ?Social History  ? ?Tobacco Use  ? Smoking status: Never  ? Smokeless tobacco: Never  ?Vaping Use  ? Vaping Use: Never used  ?Substance Use Topics  ? Alcohol use: No  ?  Alcohol/week: 0.0 standard drinks  ? Drug use: No  ? ? ? ?Medication list has been reviewed and updated. ? ?Current Meds  ?Medication Sig  ? amoxicillin-clavulanate (AUGMENTIN) 875-125 MG tablet Take 1  tablet by mouth 2 (two) times daily for 10 days.  ? ARIPiprazole (ABILIFY) 5 MG tablet Take by mouth.  ? buPROPion ER (WELLBUTRIN SR) 100 MG 12 hr tablet Take 100 mg by mouth every morning.  ? promethazine-dextromethorphan (PROMETHAZINE-DM) 6.25-15 MG/5ML syrup Take 5 mLs by mouth 4 (four) times daily as needed for up to 9 days for cough.  ? venlafaxine XR (EFFEXOR-XR) 150 MG 24 hr capsule Take 150 mg by mouth every morning.  ? ? ? ?  08/23/2021  ?  3:16 PM 10/31/2020  ? 10:46 AM 09/20/2020  ? 10:43 AM 04/25/2020  ? 11:09 AM  ?GAD 7 : Generalized Anxiety Score  ?Nervous, Anxious, on Edge 3 0 3 0  ?Control/stop worrying 3 2 3  0  ?Worry too much - different things 3 2 3  0  ?Trouble relaxing 1 0 1 0  ?Restless 1 0 0 0  ?Easily annoyed or irritable 0 1 1 0  ?Afraid - awful might happen  2 0 0  ?Total GAD 7 Score  7 11 0  ?Anxiety Difficulty Somewhat difficult  Somewhat difficult   ? ? ? ?  08/23/2021  ?  3:16 PM  ?Depression screen PHQ 2/9  ?Down, Depressed, Hopeless 3  ?PHQ - 2 Score 3  ?Altered sleeping 0  ?Tired, decreased energy 0  ?Change in appetite 0  ?Feeling bad or failure about yourself  1  ?Trouble concentrating 0  ?Moving slowly or fidgety/restless 0  ?Suicidal thoughts 0  ?PHQ-9 Score 4  ?Difficult doing work/chores Not difficult at all  ? ? ?BP Readings from Last 3 Encounters:  ?08/23/21 132/86  ?03/21/21 110/70  ?02/24/21 134/88  ? ? ?Physical Exam ?Vitals and nursing note reviewed.  ?Constitutional:   ?   General: She is not in acute distress. ?   Appearance: Normal appearance. She is well-developed.  ?HENT:  ?   Head: Normocephalic and atraumatic.  ?   Right Ear: Tympanic membrane and ear canal normal.  ?   Left Ear: Tympanic membrane and ear canal normal.  ?   Nose:  ?   Right Sinus: Maxillary sinus tenderness present. No frontal sinus tenderness.  ?   Left Sinus: Maxillary sinus tenderness present. No frontal sinus tenderness.  ?   Mouth/Throat:  ?   Tongue: No lesions.  ?   Pharynx: Posterior  oropharyngeal erythema present. No oropharyngeal exudate.  ?Neck:  ?   Vascular: No carotid bruit.  ?Cardiovascular:  ?   Rate and Rhythm: Normal rate and regular rhythm.  ?Pulmonary:  ?   Effort: Pulmonary effort is normal. No respiratory distress.  ?   Breath sounds: No wheezing or rhonchi.  ?Musculoskeletal:  ?   Cervical back: Normal range of motion.  ?Lymphadenopathy:  ?   Cervical: Cervical adenopathy present.  ?Skin: ?   General: Skin is warm and dry.  ?   Findings: No rash.  ?Neurological:  ?   Mental Status: She is alert  and oriented to person, place, and time.  ?Psychiatric:     ?   Mood and Affect: Mood normal.     ?   Behavior: Behavior normal.  ? ? ?Wt Readings from Last 3 Encounters:  ?08/23/21 154 lb (69.9 kg)  ?03/21/21 152 lb (68.9 kg)  ?02/24/21 150 lb 12.8 oz (68.4 kg)  ? ? ?BP 132/86   Pulse 98   Ht $R'5\' 4"'Ol$  (1.626 m)   Wt 154 lb (69.9 kg)   SpO2 97%   BMI 26.43 kg/m?  ? ?Assessment and Plan: ?1. Exacerbation of allergic asthma ?Continue usual medications. ?Resume chlorpheniramine. ?- amoxicillin-clavulanate (AUGMENTIN) 875-125 MG tablet; Take 1 tablet by mouth 2 (two) times daily for 10 days.  Dispense: 20 tablet; Refill: 0 ? ?2. Cough in adult ?- chlorpheniramine (CHLOR-TRIMETON) 4 MG tablet; Take 1 tablet (4 mg total) by mouth at bedtime.  Dispense: 30 tablet; Refill: 0 ?- promethazine-dextromethorphan (PROMETHAZINE-DM) 6.25-15 MG/5ML syrup; Take 5 mLs by mouth 4 (four) times daily as needed for up to 9 days for cough.  Dispense: 118 mL; Refill: 0 ? ?Partially dictated using Editor, commissioning. Any errors are unintentional. ? ?Halina Maidens, MD ?[facility] ?[facility] ? ?08/23/2021 ? ? ? ? ? ?

## 2021-09-18 ENCOUNTER — Other Ambulatory Visit: Payer: Self-pay | Admitting: Internal Medicine

## 2021-09-18 DIAGNOSIS — R059 Cough, unspecified: Secondary | ICD-10-CM

## 2021-09-19 NOTE — Telephone Encounter (Signed)
Requested Prescriptions  ?Pending Prescriptions Disp Refills  ?? [facility] ALLERGY RELIEF 4 MG tablet [Pharmacy Med Name: [facility] CHLORPHEN-MALEATE '4MG'$  TAB] 30 tablet 0  ?  Sig: TAKE 1 TABLET BY MOUTH AT BEDTIME.  ?  ? Over the Counter:  OTC Passed - 09/18/2021  1:44 PM  ?  ?  Passed - Valid encounter within last 12 months  ?  Recent Outpatient Visits   ?      ? 3 weeks ago Exacerbation of allergic asthma  ? [facility] Glean Hess, MD  ? 9 months ago Cough in adult  ? [facility] Glean Hess, MD  ? 10 months ago Cough in adult  ? [facility] Glean Hess, MD  ? 12 months ago Annual physical exam  ? [facility] Glean Hess, MD  ? 1 year ago Bronchitis  ? [facility] Glean Hess, MD  ?  ?  ?Future Appointments   ?        ? In 6 days Glean Hess, MD [facility] [place]  ?  ? ?  ?  ?  ? ? ?

## 2021-09-20 DIAGNOSIS — R69 Illness, unspecified: Secondary | ICD-10-CM | POA: Diagnosis not present

## 2021-09-20 DIAGNOSIS — F5105 Insomnia due to other mental disorder: Secondary | ICD-10-CM | POA: Diagnosis not present

## 2021-09-20 DIAGNOSIS — F411 Generalized anxiety disorder: Secondary | ICD-10-CM | POA: Diagnosis not present

## 2021-09-25 ENCOUNTER — Ambulatory Visit (INDEPENDENT_AMBULATORY_CARE_PROVIDER_SITE_OTHER): Payer: 59 | Admitting: Internal Medicine

## 2021-09-25 ENCOUNTER — Encounter: Payer: Self-pay | Admitting: Internal Medicine

## 2021-09-25 VITALS — BP 128/78 | HR 89 | Ht 64.0 in | Wt 159.0 lb

## 2021-09-25 DIAGNOSIS — Z0000 Encounter for general adult medical examination without abnormal findings: Secondary | ICD-10-CM | POA: Diagnosis not present

## 2021-09-25 DIAGNOSIS — K219 Gastro-esophageal reflux disease without esophagitis: Secondary | ICD-10-CM | POA: Diagnosis not present

## 2021-09-25 DIAGNOSIS — T753XXD Motion sickness, subsequent encounter: Secondary | ICD-10-CM | POA: Diagnosis not present

## 2021-09-25 DIAGNOSIS — B370 Candidal stomatitis: Secondary | ICD-10-CM

## 2021-09-25 DIAGNOSIS — Z1231 Encounter for screening mammogram for malignant neoplasm of breast: Secondary | ICD-10-CM | POA: Diagnosis not present

## 2021-09-25 DIAGNOSIS — J45991 Cough variant asthma: Secondary | ICD-10-CM

## 2021-09-25 DIAGNOSIS — R69 Illness, unspecified: Secondary | ICD-10-CM | POA: Diagnosis not present

## 2021-09-25 DIAGNOSIS — F331 Major depressive disorder, recurrent, moderate: Secondary | ICD-10-CM | POA: Diagnosis not present

## 2021-09-25 DIAGNOSIS — Z1211 Encounter for screening for malignant neoplasm of colon: Secondary | ICD-10-CM

## 2021-09-25 MED ORDER — OMEPRAZOLE 20 MG PO CPDR: 20.0000 mg | DELAYED_RELEASE_CAPSULE | Freq: Every day | ORAL | 3 refills | Status: DC

## 2021-09-25 MED ORDER — NYSTATIN 100000 UNIT/ML MT SUSP: 5.0000 mL | Freq: Four times a day (QID) | OROMUCOSAL | 0 refills | Status: DC

## 2021-09-25 MED ORDER — SCOPOLAMINE 1 MG/3DAYS TD PT72: 1.0000 | MEDICATED_PATCH | TRANSDERMAL | 12 refills | Status: DC

## 2021-09-25 NOTE — Progress Notes (Signed)
Date:  09/25/2021   Name:  Desiree Grant   DOB:  1955-12-13   MRN:  [id number]   Chief Complaint: Annual Exam ANGELI Grant is a 66 y.o. female who presents today for her Complete Annual Exam. She feels fairly well. She reports exercising/ gardening. She reports she is sleeping fairly well. Breast complaints none.  Mammogram: 01/2014 DEXA: none Pap smear: 09/2020 neg with co-testing Colonoscopy: FIT 12/2020  Health Maintenance Due  Topic Date Due   HIV Screening  Never done   TETANUS/TDAP  Never done   Zoster Vaccines- Shingrix (1 of 2) Never done   MAMMOGRAM  02/02/2015   COVID-19 Vaccine (2 - Janssen risk series) 12/30/2020   COLONOSCOPY (Pts 45-39yrs Insurance coverage will need to be confirmed)  05/08/2021    Immunization History  Administered Date(s) Administered   Influenza,inj,Quad PF,6+ Mos 02/07/2015, 02/08/2016, 01/23/2018, 02/08/2020   Janssen (J&J) SARS-COV-2 Vaccination 08/06/2019   Moderna SARS-COV2 Booster Vaccination 12/02/2020    Gastroesophageal Reflux She complains of coughing, heartburn and water brash. She reports no abdominal pain, no chest pain, no dysphagia, no sore throat or no wheezing. This is a recurrent problem. The problem occurs frequently. Pertinent negatives include no fatigue. Treatments tried: otc meds - unsure what.  Oral Pain  This is a new problem. The current episode started 1 to 4 weeks ago. The problem occurs constantly. Pertinent negatives include no fever, oral bleeding or sinus pressure. Associated symptoms comments: Bad taste like infection/metal; tongue feels swollen; recently took Augmentin for sinuses.  Motion sickness - may be going to [place] to see family and needs something to help.  Has used patches in the past. Lab Results  Component Value Date   NA 140 09/20/2020   K 4.4 09/20/2020   CO2 24 09/20/2020   GLUCOSE 94 09/20/2020   BUN 19 09/20/2020   CREATININE 0.90 09/20/2020   CALCIUM 9.6 09/20/2020   EGFR 72 09/20/2020    GFRNONAA 71 09/16/2019   Lab Results  Component Value Date   CHOL 270 (H) 09/20/2020   HDL 72 09/20/2020   LDLCALC 190 (H) 09/20/2020   TRIG 56 09/20/2020   CHOLHDL 3.8 09/20/2020   Lab Results  Component Value Date   TSH 1.820 09/20/2020   No results found for: HGBA1C Lab Results  Component Value Date   WBC 4.5 01/13/2021   HGB 13.5 01/13/2021   HCT 39.4 01/13/2021   MCV 85.7 01/13/2021   PLT 162 01/13/2021   Lab Results  Component Value Date   ALT 8 09/20/2020   AST 15 09/20/2020   ALKPHOS 68 09/20/2020   BILITOT 0.7 09/20/2020   No results found for: 25OHVITD2, 25OHVITD3, VD25OH   Review of Systems  Constitutional:  Positive for unexpected weight change (is eating more sweets since taking Abilify). Negative for chills, fatigue and fever.  HENT:  Positive for mouth sores and postnasal drip. Negative for congestion, hearing loss, sinus pressure, sore throat, tinnitus, trouble swallowing and voice change.   Eyes:  Negative for visual disturbance.  Respiratory:  Positive for cough. Negative for chest tightness, shortness of breath and wheezing.   Cardiovascular:  Negative for chest pain, palpitations and leg swelling.  Gastrointestinal:  Positive for heartburn. Negative for abdominal pain, constipation, diarrhea, dysphagia and vomiting.  Endocrine: Negative for polydipsia and polyuria.  Genitourinary:  Negative for dysuria, frequency, genital sores, vaginal bleeding and vaginal discharge.  Musculoskeletal:  Negative for arthralgias, gait problem and joint swelling.  Skin:  Negative for color change and rash.  Neurological:  Negative for dizziness, tremors, light-headedness and headaches.  Hematological:  Negative for adenopathy. Does not bruise/bleed easily.  Psychiatric/Behavioral:  Positive for dysphoric mood. Negative for sleep disturbance. The patient is not nervous/anxious.    Patient Active Problem List   Diagnosis Date Noted   Cervical adenopathy 03/21/2021    Chronic rhinitis 02/24/2021   Cough variant asthma with component of UACS  01/13/2021   Lipoma of right lower extremity 03/08/2020   Lumbar degenerative disc disease 11/02/2019   Gastroesophageal reflux disease without esophagitis 09/10/2018   Neoplasm of uncertain behavior of skin 09/13/2016   Muscle cramp, nocturnal 09/13/2016   Adenomatous colon polyp 02/07/2015   Hyperlipidemia, mild 02/06/2015   Fibrocystic breast changes 10/26/2014   Depression, major, recurrent, moderate ([place]) 10/26/2014    Allergies  Allergen Reactions   Niacin And Related Hives    blisters    Past Surgical History:  Procedure Laterality Date   CATARACT EXTRACTION W/PHACO Right 09/02/2017   Procedure: CATARACT EXTRACTION PHACO AND INTRAOCULAR LENS PLACEMENT ([place]) RIGHT SYMFONY TORIC;  Surgeon: Leandrew Koyanagi, MD;  Location: [place];  Service: Ophthalmology;  Laterality: Right;   CATARACT EXTRACTION W/PHACO Left 10/30/2017   Procedure: CATARACT EXTRACTION PHACO AND INTRAOCULAR LENS PLACEMENT ([place])  LEFT SYMFONY LENS;  Surgeon: Leandrew Koyanagi, MD;  Location: [place];  Service: Ophthalmology;  Laterality: Left;   COLONOSCOPY W/ POLYPECTOMY  2012   tubular adenoma 0.5cm was removed from descending colon   FACIAL COSMETIC SURGERY  2015   PARTIAL HYSTERECTOMY     bleeding; cervix and ovary remains    Social History   Tobacco Use   Smoking status: Never   Smokeless tobacco: Never  Vaping Use   Vaping Use: Never used  Substance Use Topics   Alcohol use: No    Alcohol/week: 0.0 standard drinks   Drug use: No     Medication list has been reviewed and updated.  Current Meds  Medication Sig   ARIPiprazole (ABILIFY) 5 MG tablet Take by mouth daily.   buPROPion ER (WELLBUTRIN SR) 100 MG 12 hr tablet Take 100 mg by mouth every morning.   [facility]S ALLERGY RELIEF 4 MG tablet TAKE 1 TABLET BY MOUTH AT BEDTIME.   meloxicam (MOBIC) 15 MG tablet Take 15 mg by mouth daily.    venlafaxine XR (EFFEXOR-XR) 150 MG 24 hr capsule Take 150 mg by mouth every morning.       08/23/2021    3:16 PM 10/31/2020   10:46 AM 09/20/2020   10:43 AM 04/25/2020   11:09 AM  GAD 7 : Generalized Anxiety Score  Nervous, Anxious, on Edge 3 0 3 0  Control/stop worrying 3 2 3  0  Worry too much - different things 3 2 3  0  Trouble relaxing 1 0 1 0  Restless 1 0 0 0  Easily annoyed or irritable 0 1 1 0  Afraid - awful might happen  2 0 0  Total GAD 7 Score  7 11 0  Anxiety Difficulty Somewhat difficult  Somewhat difficult        08/23/2021    3:16 PM  Depression screen PHQ 2/9  Down, Depressed, Hopeless 3  PHQ - 2 Score 3  Altered sleeping 0  Tired, decreased energy 0  Change in appetite 0  Feeling bad or failure about yourself  1  Trouble concentrating 0  Moving slowly or fidgety/restless 0  Suicidal thoughts 0  PHQ-9 Score 4  Difficult doing work/chores Not difficult at all    BP Readings from Last 3 Encounters:  09/25/21 128/78  08/23/21 132/86  03/21/21 110/70    Physical Exam Vitals and nursing note reviewed.  Constitutional:      General: She is not in acute distress.    Appearance: She is well-developed.  HENT:     Head: Normocephalic and atraumatic.     Right Ear: Tympanic membrane and ear canal normal.     Left Ear: Tympanic membrane and ear canal normal.     Nose:     Right Sinus: No maxillary sinus tenderness.     Left Sinus: No maxillary sinus tenderness.     Mouth/Throat:     Lips: Pink.     Pharynx: Oropharynx is clear.     Comments: Mild white coating tongue; no buccal lesions Eyes:     General: No scleral icterus.       Right eye: No discharge.        Left eye: No discharge.     Conjunctiva/sclera: Conjunctivae normal.  Neck:     Thyroid: No thyromegaly.     Vascular: No carotid bruit.  Cardiovascular:     Rate and Rhythm: Normal rate and regular rhythm.     Pulses: Normal pulses.     Heart sounds: Normal heart sounds.  Pulmonary:      Effort: Pulmonary effort is normal. No respiratory distress.     Breath sounds: No wheezing.  Chest:  Breasts:    Right: No mass, nipple discharge, skin change or tenderness.     Left: No mass, nipple discharge, skin change or tenderness.  Abdominal:     General: Bowel sounds are normal.     Palpations: Abdomen is soft.     Tenderness: There is no abdominal tenderness.  Musculoskeletal:     Cervical back: Normal range of motion. No erythema.     Right lower leg: No edema.     Left lower leg: No edema.  Lymphadenopathy:     Cervical: No cervical adenopathy.  Skin:    General: Skin is warm and dry.     Capillary Refill: Capillary refill takes less than 2 seconds.     Findings: No rash.  Neurological:     General: No focal deficit present.     Mental Status: She is alert and oriented to person, place, and time.     Cranial Nerves: No cranial nerve deficit.     Sensory: No sensory deficit.     Deep Tendon Reflexes: Reflexes are normal and symmetric.  Psychiatric:        Attention and Perception: Attention normal.        Mood and Affect: Mood normal.    Wt Readings from Last 3 Encounters:  09/25/21 159 lb (72.1 kg)  08/23/21 154 lb (69.9 kg)  03/21/21 152 lb (68.9 kg)    BP 128/78   Pulse 89   Ht $R'5\' 4"'wO$  (1.626 m)   Wt 159 lb (72.1 kg)   SpO2 99%   BMI 27.29 kg/m   Assessment and Plan: 1. Annual physical exam Normal exam; recommend cutting back on sweets to avoid weight gain. She is not a candidate for weight loss medications - CBC with Differential/Platelet - Comprehensive metabolic panel - Hemoglobin A1c - Lipid panel - TSH  2. Encounter for screening mammogram for breast cancer Schedule in [place] - MM 3D SCREEN BREAST BILATERAL  3. Colon cancer screening Due for annual FIT - Fecal  occult blood, imunochemical  4. Depression, major, recurrent, moderate ([facility]) Clinically stable on current regimen with good control of symptoms, No SI or HI. Followed by Psych -  now on Abilify as well as Effexor and Bupropion Will continue current therapy.  5. Thrush, oral Suspect this is the cause of her oral complaints - nystatin (MYCOSTATIN) 100000 UNIT/ML suspension; Take 5 mLs (500,000 Units total) by mouth 4 (four) times daily.  Dispense: 60 mL; Refill: 0  6. Motion sickness, subsequent encounter - scopolamine (TRANSDERM SCOP, 1.5 MG,) 1 MG/3DAYS; Place 1 patch (1.5 mg total) onto the skin every 3 (three) days.  Dispense: 10 patch; Refill: 12  7. Gastroesophageal reflux disease without esophagitis Take omeprazole daily for 2 weeks then stop and use only PRN Follow up if symptoms do not improve. - omeprazole (PRILOSEC) 20 MG capsule; Take 1 capsule (20 mg total) by mouth daily.  Dispense: 30 capsule; Refill: 3  8. Cough variant asthma with component of UACS  Has seen Pulmonary Not currently using inhaler or any asthma specific medication Cough is rare - I do not think that a steroid taper as pt requests would be of benefit. Will treat thrush then re-evaluate.   Partially dictated using Editor, commissioning. Any errors are unintentional.  Halina Maidens, MD [facility]  09/25/2021

## 2021-09-26 LAB — CBC WITH DIFFERENTIAL/PLATELET
Basophils Absolute: 0 10*3/uL (ref 0.0–0.2)
Basos: 1 %
EOS (ABSOLUTE): 0.1 10*3/uL (ref 0.0–0.4)
Eos: 2 %
Hematocrit: 41.7 % (ref 34.0–46.6)
Hemoglobin: 13.9 g/dL (ref 11.1–15.9)
Immature Grans (Abs): 0 10*3/uL (ref 0.0–0.1)
Immature Granulocytes: 0 %
Lymphocytes Absolute: 1 10*3/uL (ref 0.7–3.1)
Lymphs: 28 %
MCH: 28 pg (ref 26.6–33.0)
MCHC: 33.3 g/dL (ref 31.5–35.7)
MCV: 84 fL (ref 79–97)
Monocytes Absolute: 0.2 10*3/uL (ref 0.1–0.9)
Monocytes: 7 %
Neutrophils Absolute: 2.2 10*3/uL (ref 1.4–7.0)
Neutrophils: 62 %
Platelets: 203 10*3/uL (ref 150–450)
RBC: 4.97 x10E6/uL (ref 3.77–5.28)
RDW: 12 % (ref 11.7–15.4)
WBC: 3.4 10*3/uL (ref 3.4–10.8)

## 2021-09-26 LAB — HEMOGLOBIN A1C
Est. average glucose Bld gHb Est-mCnc: 103 mg/dL
Hgb A1c MFr Bld: 5.2 % (ref 4.8–5.6)

## 2021-09-26 LAB — LIPID PANEL
Chol/HDL Ratio: 3.3 ratio (ref 0.0–4.4)
Cholesterol, Total: 226 mg/dL — ABNORMAL HIGH (ref 100–199)
HDL: 69 mg/dL (ref >39)
LDL Chol Calc (NIH): 146 mg/dL — ABNORMAL HIGH (ref 0–99)
Triglycerides: 63 mg/dL (ref 0–149)
VLDL Cholesterol Cal: 11 mg/dL (ref 5–40)

## 2021-09-26 LAB — COMPREHENSIVE METABOLIC PANEL
ALT: 15 IU/L (ref 0–32)
AST: 18 IU/L (ref 0–40)
Albumin/Globulin Ratio: 3.1 — ABNORMAL HIGH (ref 1.2–2.2)
Albumin: 4.6 g/dL (ref 3.8–4.8)
Alkaline Phosphatase: 78 IU/L (ref 44–121)
BUN/Creatinine Ratio: 16 (ref 12–28)
BUN: 13 mg/dL (ref 8–27)
Bilirubin Total: 0.6 mg/dL (ref 0.0–1.2)
CO2: 24 mmol/L (ref 20–29)
Calcium: 9.4 mg/dL (ref 8.7–10.3)
Chloride: 104 mmol/L (ref 96–106)
Creatinine, Ser: 0.81 mg/dL (ref 0.57–1.00)
Globulin, Total: 1.5 g/dL (ref 1.5–4.5)
Glucose: 99 mg/dL (ref 70–99)
Potassium: 4.3 mmol/L (ref 3.5–5.2)
Sodium: 143 mmol/L (ref 134–144)
Total Protein: 6.1 g/dL (ref 6.0–8.5)
eGFR: 81 mL/min/{1.73_m2} (ref >59)

## 2021-09-26 LAB — TSH: TSH: 2.42 u[IU]/mL (ref 0.450–4.500)

## 2021-10-11 DIAGNOSIS — Z1211 Encounter for screening for malignant neoplasm of colon: Secondary | ICD-10-CM | POA: Diagnosis not present

## 2021-10-12 LAB — SPECIMEN STATUS REPORT

## 2021-10-12 LAB — FECAL OCCULT BLOOD, IMMUNOCHEMICAL: Fecal Occult Bld: NEGATIVE

## 2021-10-26 DIAGNOSIS — M62830 Muscle spasm of back: Secondary | ICD-10-CM | POA: Diagnosis not present

## 2021-10-26 DIAGNOSIS — M955 Acquired deformity of pelvis: Secondary | ICD-10-CM | POA: Diagnosis not present

## 2021-10-26 DIAGNOSIS — M9902 Segmental and somatic dysfunction of thoracic region: Secondary | ICD-10-CM | POA: Diagnosis not present

## 2021-10-26 DIAGNOSIS — M5432 Sciatica, left side: Secondary | ICD-10-CM | POA: Diagnosis not present

## 2021-10-26 DIAGNOSIS — M5013 Cervical disc disorder with radiculopathy, cervicothoracic region: Secondary | ICD-10-CM | POA: Diagnosis not present

## 2021-10-26 DIAGNOSIS — M9901 Segmental and somatic dysfunction of cervical region: Secondary | ICD-10-CM | POA: Diagnosis not present

## 2021-10-26 DIAGNOSIS — M9905 Segmental and somatic dysfunction of pelvic region: Secondary | ICD-10-CM | POA: Diagnosis not present

## 2021-10-26 DIAGNOSIS — M9903 Segmental and somatic dysfunction of lumbar region: Secondary | ICD-10-CM | POA: Diagnosis not present

## 2021-11-21 DIAGNOSIS — F411 Generalized anxiety disorder: Secondary | ICD-10-CM | POA: Diagnosis not present

## 2021-11-21 DIAGNOSIS — R69 Illness, unspecified: Secondary | ICD-10-CM | POA: Diagnosis not present

## 2021-11-24 DIAGNOSIS — F411 Generalized anxiety disorder: Secondary | ICD-10-CM | POA: Diagnosis not present

## 2021-11-24 DIAGNOSIS — F5105 Insomnia due to other mental disorder: Secondary | ICD-10-CM | POA: Diagnosis not present

## 2021-11-24 DIAGNOSIS — R69 Illness, unspecified: Secondary | ICD-10-CM | POA: Diagnosis not present

## 2021-12-21 ENCOUNTER — Other Ambulatory Visit: Payer: Self-pay

## 2021-12-21 ENCOUNTER — Telehealth: Payer: Self-pay | Admitting: Internal Medicine

## 2021-12-21 DIAGNOSIS — F5105 Insomnia due to other mental disorder: Secondary | ICD-10-CM | POA: Diagnosis not present

## 2021-12-21 DIAGNOSIS — R69 Illness, unspecified: Secondary | ICD-10-CM | POA: Diagnosis not present

## 2021-12-21 DIAGNOSIS — F411 Generalized anxiety disorder: Secondary | ICD-10-CM | POA: Diagnosis not present

## 2021-12-21 NOTE — Telephone Encounter (Signed)
Called pt told her that our chart only allow [place] to print off medication from 2020 to 2023. Pt verbalized understanding.  KP

## 2021-12-21 NOTE — Telephone Encounter (Signed)
Pt called stating that she needs from 2016 on. Requesting a call back from clinic

## 2021-12-21 NOTE — Telephone Encounter (Signed)
Copied from [place] [id number]. Topic: Medical Record Request - Patient ROI Request >> Dec 21, 2021 11:43 AM Desiree Grant wrote: Reason for CRM: The patient has called to request a copy of their medication list from 2015-2022 in entirety   The patient would like a copy of the list mailed to them as well as placed in mychart   Please contact the patient further if needed

## 2021-12-21 NOTE — Telephone Encounter (Signed)
Copied from [place] [id number]. Topic: Medical Record Request - Patient ROI Request >> Dec 21, 2021 11:43 AM Everette C wrote: Reason for CRM: The patient has called to request a copy of their medication list from 2015-2022 in entirety   The patient would like a copy of the list mailed to them as well as placed in mychart   Please contact the patient further if needed

## 2021-12-21 NOTE — Telephone Encounter (Signed)
Figured out a way to print 2020-2023 but not from 2015. Will mail to pt.  KP

## 2021-12-21 NOTE — Telephone Encounter (Signed)
Called pt left VM to call back. We can not print off years of medication we can only print out her current medication list.  KP

## 2022-01-10 DIAGNOSIS — F5105 Insomnia due to other mental disorder: Secondary | ICD-10-CM | POA: Diagnosis not present

## 2022-01-10 DIAGNOSIS — F411 Generalized anxiety disorder: Secondary | ICD-10-CM | POA: Diagnosis not present

## 2022-01-10 DIAGNOSIS — R69 Illness, unspecified: Secondary | ICD-10-CM | POA: Diagnosis not present

## 2022-01-16 DIAGNOSIS — R69 Illness, unspecified: Secondary | ICD-10-CM | POA: Diagnosis not present

## 2022-01-16 DIAGNOSIS — F411 Generalized anxiety disorder: Secondary | ICD-10-CM | POA: Diagnosis not present

## 2022-01-29 DIAGNOSIS — R69 Illness, unspecified: Secondary | ICD-10-CM | POA: Diagnosis not present

## 2022-01-29 DIAGNOSIS — F411 Generalized anxiety disorder: Secondary | ICD-10-CM | POA: Diagnosis not present

## 2022-03-07 DIAGNOSIS — F411 Generalized anxiety disorder: Secondary | ICD-10-CM | POA: Diagnosis not present

## 2022-03-07 DIAGNOSIS — R69 Illness, unspecified: Secondary | ICD-10-CM | POA: Diagnosis not present

## 2022-04-17 DIAGNOSIS — R69 Illness, unspecified: Secondary | ICD-10-CM | POA: Diagnosis not present

## 2022-04-17 DIAGNOSIS — F5105 Insomnia due to other mental disorder: Secondary | ICD-10-CM | POA: Diagnosis not present

## 2022-04-17 DIAGNOSIS — F411 Generalized anxiety disorder: Secondary | ICD-10-CM | POA: Diagnosis not present

## 2022-06-27 DIAGNOSIS — K080 Exfoliation of teeth due to systemic causes: Secondary | ICD-10-CM | POA: Diagnosis not present

## 2022-07-02 ENCOUNTER — Encounter: Payer: Self-pay | Admitting: Internal Medicine

## 2022-07-02 ENCOUNTER — Telehealth: Payer: Self-pay | Admitting: Internal Medicine

## 2022-07-02 NOTE — Telephone Encounter (Signed)
Spoke with patient and informed she does not need a referral for this, told her she can call and schedule her own appt.  Allecia Bells

## 2022-07-02 NOTE — Telephone Encounter (Signed)
Copied from [name] [id number]. Topic: Referral - Status >> Jul 02, 2022  1:28 PM Cyndi Bender wrote: Reason for CRM: Pt requests a referral to a gynecologist.

## 2022-07-03 ENCOUNTER — Ambulatory Visit (INDEPENDENT_AMBULATORY_CARE_PROVIDER_SITE_OTHER): Payer: Medicare Other | Admitting: Internal Medicine

## 2022-07-03 ENCOUNTER — Encounter: Payer: Self-pay | Admitting: Internal Medicine

## 2022-07-03 VITALS — BP 112/70 | HR 78 | Ht 64.0 in | Wt 145.2 lb

## 2022-07-03 DIAGNOSIS — F331 Major depressive disorder, recurrent, moderate: Secondary | ICD-10-CM

## 2022-07-03 DIAGNOSIS — N6459 Other signs and symptoms in breast: Secondary | ICD-10-CM | POA: Diagnosis not present

## 2022-07-03 DIAGNOSIS — N816 Rectocele: Secondary | ICD-10-CM

## 2022-07-03 DIAGNOSIS — K645 Perianal venous thrombosis: Secondary | ICD-10-CM | POA: Diagnosis not present

## 2022-07-03 DIAGNOSIS — Z1231 Encounter for screening mammogram for malignant neoplasm of breast: Secondary | ICD-10-CM

## 2022-07-03 MED ORDER — HYDROCORTISONE (PERIANAL) 2.5 % EX CREA: 1.0000 | TOPICAL_CREAM | Freq: Two times a day (BID) | CUTANEOUS | 2 refills | Status: DC

## 2022-07-03 NOTE — Progress Notes (Signed)
Date:  07/03/2022   Name:  Desiree Grant   DOB:  Aug 01, 1955   MRN:  [id number]   Chief Complaint: inverted nipple (Right nipple. Noticed this week her nipple has inverted on one side.) and Cyst (Patient said something purple and grape sized is hanging out of her vagina. Not painful.)  HPI Hemorrhoid - has a hemorrhoid that is not bleeding or painful but she feels the lump.  She took a picture and noticed something protruding from her vagina.  She has been on Wegovy and that is causing constipation.  She has to take Miralax and make her stool very soft or mushy in order to have a bowel movement.  Vaginal mass - the mass is small and non tender.  There is no bleeding.  She is s/p hysterectomy.  Mammogram - she is overdue for a mammogram.  She has a chronic right inverted nipple which is unchanged.  She denies breast pain or mass.  Lab Results  Component Value Date   NA 143 09/25/2021   K 4.3 09/25/2021   CO2 24 09/25/2021   GLUCOSE 99 09/25/2021   BUN 13 09/25/2021   CREATININE 0.81 09/25/2021   CALCIUM 9.4 09/25/2021   EGFR 81 09/25/2021   GFRNONAA 71 09/16/2019   Lab Results  Component Value Date   CHOL 226 (H) 09/25/2021   HDL 69 09/25/2021   LDLCALC 146 (H) 09/25/2021   TRIG 63 09/25/2021   CHOLHDL 3.3 09/25/2021   Lab Results  Component Value Date   TSH 2.420 09/25/2021   Lab Results  Component Value Date   HGBA1C 5.2 09/25/2021   Lab Results  Component Value Date   WBC 3.4 09/25/2021   HGB 13.9 09/25/2021   HCT 41.7 09/25/2021   MCV 84 09/25/2021   PLT 203 09/25/2021   Lab Results  Component Value Date   ALT 15 09/25/2021   AST 18 09/25/2021   ALKPHOS 78 09/25/2021   BILITOT 0.6 09/25/2021   No results found for: "25OHVITD2", "25OHVITD3", "VD25OH"   Review of Systems  Constitutional:  Negative for chills, fatigue and fever.  Respiratory:  Negative for chest tightness and shortness of breath.   Cardiovascular:  Negative for chest pain and  palpitations.  Gastrointestinal:  Positive for constipation. Negative for rectal pain.  Genitourinary:  Negative for dysuria, hematuria and pelvic pain.  Psychiatric/Behavioral:  Positive for dysphoric mood. Negative for sleep disturbance. The patient is nervous/anxious.     Patient Active Problem List   Diagnosis Date Noted   Cervical adenopathy 03/21/2021   Chronic rhinitis 02/24/2021   Cough variant asthma with component of UACS  01/13/2021   Lipoma of right lower extremity 03/08/2020   Lumbar degenerative disc disease 11/02/2019   Gastroesophageal reflux disease without esophagitis 09/10/2018   Neoplasm of uncertain behavior of skin 09/13/2016   Muscle cramp, nocturnal 09/13/2016   Adenomatous colon polyp 02/07/2015   Hyperlipidemia, mild 02/06/2015   Fibrocystic breast changes 10/26/2014   Depression, major, recurrent, moderate ([place]) 10/26/2014    Allergies  Allergen Reactions   Niacin And Related Hives    blisters    Past Surgical History:  Procedure Laterality Date   CATARACT EXTRACTION W/PHACO Right 09/02/2017   Procedure: CATARACT EXTRACTION PHACO AND INTRAOCULAR LENS PLACEMENT ([facility]) RIGHT SYMFONY TORIC;  Surgeon: Leandrew Koyanagi, MD;  Location: [place];  Service: Ophthalmology;  Laterality: Right;   CATARACT EXTRACTION W/PHACO Left 10/30/2017   Procedure: CATARACT EXTRACTION PHACO AND INTRAOCULAR LENS PLACEMENT ([facility])  LEFT SYMFONY LENS;  Surgeon: Leandrew Koyanagi, MD;  Location: [place];  Service: Ophthalmology;  Laterality: Left;   COLONOSCOPY W/ POLYPECTOMY  2012   tubular adenoma 0.5cm was removed from descending colon   FACIAL COSMETIC SURGERY  2015   PARTIAL HYSTERECTOMY     bleeding; cervix and ovary remains    Social History   Tobacco Use   Smoking status: Never   Smokeless tobacco: Never  Vaping Use   Vaping Use: Never used  Substance Use Topics   Alcohol use: No    Alcohol/week: 0.0 standard drinks of alcohol   Drug  use: No     Medication list has been reviewed and updated.  Current Meds  Medication Sig   ARIPiprazole (ABILIFY) 5 MG tablet Take by mouth daily.   buPROPion ER (WELLBUTRIN SR) 100 MG 12 hr tablet Take 100 mg by mouth every morning.   [employer] ALLERGY RELIEF 4 MG tablet TAKE 1 TABLET BY MOUTH AT BEDTIME.   hydrocortisone (ANUSOL-HC) 2.5 % rectal cream Place 1 Application rectally 2 (two) times daily.   meloxicam (MOBIC) 15 MG tablet Take 15 mg by mouth daily.   nystatin (MYCOSTATIN) 100000 UNIT/ML suspension Take 5 mLs (500,000 Units total) by mouth 4 (four) times daily.   Semaglutide-Weight Management (WEGOVY) 0.5 MG/0.5ML SOAJ Inject 0.5 mg into the skin once a week.   venlafaxine XR (EFFEXOR-XR) 150 MG 24 hr capsule Take 150 mg by mouth every morning.       07/03/2022    1:40 PM 09/25/2021   10:51 AM 08/23/2021    3:16 PM 10/31/2020   10:46 AM  GAD 7 : Generalized Anxiety Score  Nervous, Anxious, on Edge 1 0 3 0  Control/stop worrying '1 2 3 2  '$ Worry too much - different things '1 2 3 2  '$ Trouble relaxing 0 0 1 0  Restless 0 1 1 0  Easily annoyed or irritable 1 1 0 1  Afraid - awful might happen '3 3  2  '$ Total GAD 7 Score '7 9  7  '$ Anxiety Difficulty Somewhat difficult Somewhat difficult Somewhat difficult        07/03/2022    1:40 PM 09/25/2021   10:51 AM 08/23/2021    3:16 PM  Depression screen PHQ 2/9  Decreased Interest 2 1   Down, Depressed, Hopeless 0 1 3  PHQ - 2 Score '2 2 3  '$ Altered sleeping 0 0 0  Tired, decreased energy 2 1 0  Change in appetite 1 1 0  Feeling bad or failure about yourself  2 0 1  Trouble concentrating 2 1 0  Moving slowly or fidgety/restless 0 1 0  Suicidal thoughts 0 0 0  PHQ-9 Score '9 6 4  '$ Difficult doing work/chores Not difficult at all Not difficult at all Not difficult at all    BP Readings from Last 3 Encounters:  07/03/22 112/70  09/25/21 128/78  08/23/21 132/86    Physical Exam Vitals and nursing note reviewed.  Constitutional:       General: She is not in acute distress.    Appearance: Normal appearance. She is well-developed.  HENT:     Head: Normocephalic and atraumatic.  Cardiovascular:     Rate and Rhythm: Normal rate and regular rhythm.  Pulmonary:     Effort: Pulmonary effort is normal. No respiratory distress.     Breath sounds: No wheezing or rhonchi.  Chest:  Breasts:    Right: Inverted nipple present. No mass, nipple  discharge, skin change or tenderness.     Left: No inverted nipple, mass, nipple discharge, skin change or tenderness.  Genitourinary:    Labia:        Right: No tenderness, lesion or injury.        Left: No tenderness, lesion or injury.      Vagina: Normal.     Uterus: Absent.      Rectum: External hemorrhoid present.     Comments: Thrombosed hemorrhoid at 3 o'clock.  Skin tag at 9 o'clock. Small rectocele noted with valsalva. Musculoskeletal:     Cervical back: Normal range of motion.  Lymphadenopathy:     Cervical: No cervical adenopathy.  Skin:    General: Skin is warm and dry.     Findings: No rash.  Neurological:     Mental Status: She is alert and oriented to person, place, and time.  Psychiatric:        Attention and Perception: Attention normal.        Mood and Affect: Mood normal. Affect is flat.        Behavior: Behavior normal.     Wt Readings from Last 3 Encounters:  07/03/22 145 lb 3.2 oz (65.9 kg)  09/25/21 159 lb (72.1 kg)  08/23/21 154 lb (69.9 kg)    BP 112/70   Pulse 78   Ht '5\' 4"'$  (1.626 m)   Wt 145 lb 3.2 oz (65.9 kg)   SpO2 100%   BMI 24.92 kg/m   Assessment and Plan: Problem List Items Addressed This Visit       Other   Depression, major, recurrent, moderate (HCC) (Chronic)    Clinically stable on current regimen with good control of symptoms, No SI or HI. Continue under psych care.       Other Visit Diagnoses     Thrombosed external hemorrhoid    -  Primary   hot baths topical cortisone should not need surgery   Relevant  Medications   hydrocortisone (ANUSOL-HC) 2.5 % rectal cream   Rectocele       mild currently but likely causing some issues with rectal evacuation   Relevant Orders   Ambulatory referral to Obstetrics / Gynecology   Inverted nipple   (Chronic)     Encounter for screening mammogram for breast cancer       Relevant Orders   MM 3D SCREEN BREAST BILATERAL        Partially dictated using Editor, commissioning. Any errors are unintentional.  Halina Maidens, MD [facility]  07/03/2022

## 2022-07-03 NOTE — Assessment & Plan Note (Signed)
Clinically stable on current regimen with good control of symptoms, No SI or HI. Continue under psych care.

## 2022-07-03 NOTE — Patient Instructions (Signed)
Call [facility] to schedule your mammogram at [phone].  

## 2022-07-05 ENCOUNTER — Ambulatory Visit
Admission: RE | Admit: 2022-07-05 | Discharge: 2022-07-05 | Disposition: A | Discharge status: Home / Self Care | Payer: Medicare Other | Source: Ambulatory Visit | Attending: Internal Medicine | Admitting: Internal Medicine

## 2022-07-05 DIAGNOSIS — Z1231 Encounter for screening mammogram for malignant neoplasm of breast: Secondary | ICD-10-CM | POA: Diagnosis not present

## 2022-07-10 DIAGNOSIS — H43813 Vitreous degeneration, bilateral: Secondary | ICD-10-CM | POA: Diagnosis not present

## 2022-07-10 DIAGNOSIS — Z961 Presence of intraocular lens: Secondary | ICD-10-CM | POA: Diagnosis not present

## 2022-07-11 ENCOUNTER — Encounter: Payer: Self-pay | Admitting: Internal Medicine

## 2022-07-27 ENCOUNTER — Encounter: Payer: Self-pay | Admitting: Obstetrics and Gynecology

## 2022-07-27 ENCOUNTER — Ambulatory Visit: Payer: Medicare Other | Admitting: Obstetrics and Gynecology

## 2022-07-27 VITALS — BP 105/73 | HR 101 | Ht 64.0 in | Wt 140.4 lb

## 2022-07-27 DIAGNOSIS — Z7689 Persons encountering health services in other specified circumstances: Secondary | ICD-10-CM | POA: Diagnosis not present

## 2022-07-27 DIAGNOSIS — N816 Rectocele: Secondary | ICD-10-CM | POA: Diagnosis not present

## 2022-07-27 DIAGNOSIS — N3941 Urge incontinence: Secondary | ICD-10-CM | POA: Diagnosis not present

## 2022-07-27 NOTE — Progress Notes (Signed)
HPI:      [name] is a 67 y.o. G3P2 who LMP was No LMP recorded. Patient has had a hysterectomy.  Subjective:   She presents today to discuss possible rectocele.  This was noted by her primary care physician.  She has been having trouble with bowel movements.  She has to keep her stool soft all the time.  She also has noticed some back pain and something bulging at the vagina.  She reports that this became worse over an 84-month period where she was coughing daily. She is not currently sexually active but is considering sexual activity in the future. She occasionally leaks urine-wears a pad all the time-but she does not see this as a major problem. Of significant note, patient had a previous hysterectomy for uterine fibroids.    Hx: The following portions of the patient's history were reviewed and updated as appropriate:             She  has a past medical history of Bowel trouble (2010), Depression, major, recurrent, moderate ([place]) (10/26/2014), Hyperlipidemia, mild (02/06/2015), Motion sickness, Shoulder strain, right, initial encounter (09/13/2016), and Special screening for malignant neoplasms, colon (2012). She does not have any pertinent problems on file. She  has a past surgical history that includes Colonoscopy w/ polypectomy (2012); Partial hysterectomy; Facial cosmetic surgery (2015); Cataract extraction w/PHACO (Right, 09/02/2017); and Cataract extraction w/PHACO (Left, 10/30/2017). Her family history includes COPD in her mother. She  reports that she has never smoked. She has never used smokeless tobacco. She reports that she does not drink alcohol and does not use drugs. She has a current medication list which includes the following prescription(s): aripiprazole, bupropion er, hydrocortisone, wegovy, and venlafaxine xr. She is allergic to niacin and related.       Review of Systems:  Review of Systems  Constitutional: Denied constitutional symptoms, night sweats, recent  illness, fatigue, fever, insomnia and weight loss.  Eyes: Denied eye symptoms, eye pain, photophobia, vision change and visual disturbance.  Ears/Nose/Throat/Neck: Denied ear, nose, throat or neck symptoms, hearing loss, nasal discharge, sinus congestion and sore throat.  Cardiovascular: Denied cardiovascular symptoms, arrhythmia, chest pain/pressure, edema, exercise intolerance, orthopnea and palpitations.  Respiratory: Denied pulmonary symptoms, asthma, pleuritic pain, productive sputum, cough, dyspnea and wheezing.  Gastrointestinal: Denied, gastro-esophageal reflux, melena, nausea and vomiting.  Genitourinary: See HPI for additional information.  Musculoskeletal: Denied musculoskeletal symptoms, stiffness, swelling, muscle weakness and myalgia.  Dermatologic: Denied dermatology symptoms, rash and scar.  Neurologic: Denied neurology symptoms, dizziness, headache, neck pain and syncope.  Psychiatric: Denied psychiatric symptoms, anxiety and depression.  Endocrine: Denied endocrine symptoms including hot flashes and night sweats.   Meds:   Current Outpatient Medications on File Prior to Visit  Medication Sig Dispense Refill   ARIPiprazole (ABILIFY) 5 MG tablet Take by mouth daily.     buPROPion ER (WELLBUTRIN SR) 100 MG 12 hr tablet Take 100 mg by mouth every morning.     hydrocortisone (ANUSOL-HC) 2.5 % rectal cream Place 1 Application rectally 2 (two) times daily. 30 g 2   Semaglutide-Weight Management (WEGOVY) 0.5 MG/0.5ML SOAJ Inject 0.5 mg into the skin once a week.     venlafaxine XR (EFFEXOR-XR) 150 MG 24 hr capsule Take 150 mg by mouth every morning.     No current facility-administered medications on file prior to visit.      Objective:     Vitals:   07/27/22 1011  BP: 105/73  Pulse: (!) 101  Filed Weights   07/27/22 1011  Weight: 140 lb 6.4 oz (63.7 kg)              Physical examination   Pelvic:   Vulva: Normal appearance.  No lesions.  Vagina: No lesions or  abnormalities noted.  Vaginal atrophy  Support: Third-degree rectocele second-degree cystocele  Urethra No masses tenderness or scarring.  Meatus Normal size without lesions or prolapse.  Cervix: Surgically absent  Anus: Normal exam.  No lesions.  Perineum: Normal exam.  No lesions.        Bimanual   Uterus: Surgically absent  Adnexae: No masses.  Non-tender to palpation.  Cul-de-sac: Negative for abnormality.             Assessment:    G3P2 Patient Active Problem List   Diagnosis Date Noted   Cervical adenopathy 03/21/2021   Chronic rhinitis 02/24/2021   Cough variant asthma with component of UACS  01/13/2021   Lipoma of right lower extremity 03/08/2020   Lumbar degenerative disc disease 11/02/2019   Gastroesophageal reflux disease without esophagitis 09/10/2018   Neoplasm of uncertain behavior of skin 09/13/2016   Muscle cramp, nocturnal 09/13/2016   Adenomatous colon polyp 02/07/2015   Hyperlipidemia, mild 02/06/2015   Fibrocystic breast changes 10/26/2014   Depression, major, recurrent, moderate ([place]) 10/26/2014     1. Establishing care with new doctor, encounter for   2. Rectocele   3. Urge incontinence     Patient's main problem is rectocele.  Is causing difficulty with bowel movements and likely causing her back pain.  She also notices something bulging from the vagina. She does have some cystocele issues with a second-degree cystocele and some urine loss but this is not a major problem at this time.   Plan:            1.  We have discussed multiple management schemes for cystocele rectocele including pessary and surgery.  She is leaning toward surgery at this time.  Would like to consider anterior and posterior repair with TOT. Recommend 1 month of vaginal estrogen cream prior to surgery because of atrophy.  She would like to consider her options and reschedule when she has made a decision. Orders No orders of the defined types were placed in this  encounter.   No orders of the defined types were placed in this encounter.     F/U  No follow-ups on file. I spent 34 minutes involved in the care of this patient preparing to see the patient by obtaining and reviewing her medical history (including labs, imaging tests and prior procedures), documenting clinical information in the electronic health record (EHR), counseling and coordinating care plans, writing and sending prescriptions, ordering tests or procedures and in direct communicating with the patient and medical staff discussing pertinent items from her history and physical exam.  Finis Bud, M.D. 07/27/2022 10:51 AM

## 2022-07-27 NOTE — Progress Notes (Signed)
Patient presents today due to possible rectocele. She states over the past 7-8 months experiencing back pain and something dropping. Trouble with constipation and urge incontinence have been an increased issue as well, currently uses daily stool softeners. No additional concerns.

## 2022-08-15 ENCOUNTER — Ambulatory Visit (INDEPENDENT_AMBULATORY_CARE_PROVIDER_SITE_OTHER): Payer: Medicare Other

## 2022-08-15 VITALS — Ht 64.0 in

## 2022-08-15 DIAGNOSIS — Z0000 Encounter for general adult medical examination without abnormal findings: Secondary | ICD-10-CM | POA: Diagnosis not present

## 2022-08-15 NOTE — Progress Notes (Signed)
Subjective:   Desiree Grant is a 67 y.o. female who presents for Medicare Annual (Subsequent) preventive examination.  Review of Systems    I connected with  Desiree Grant on 08/15/22 by a audio enabled telemedicine application and verified that I am speaking with the correct Desiree Grant using two identifiers.  Patient Location: Home  Provider Location: Office/Clinic  I discussed the limitations of evaluation and management by telemedicine. The patient expressed understanding and agreed to proceed.        Objective:    Today's Vitals   08/15/22 0947  Height: 5\' 4"  (1.626 m)   Body mass index is 24.1 kg/m.     10/30/2017    8:36 AM 09/02/2017    8:42 AM 10/18/2016    2:20 PM  Advanced Directives  Does Patient Have a Medical Advance Directive? Yes Yes Yes  Type of Advance Directive Living will Living will;Healthcare Power of Attorney Living will  Does patient want to make changes to medical advance directive? No - Patient declined No - Patient declined   Copy of Healthcare Power of Attorney in Chart?  No - copy requested     Current Medications (verified) Outpatient Encounter Medications as of 08/15/2022  Medication Sig   ARIPiprazole (ABILIFY) 5 MG tablet Take by mouth daily.   buPROPion ER (WELLBUTRIN SR) 100 MG 12 hr tablet Take 100 mg by mouth every morning.   hydrocortisone (ANUSOL-HC) 2.5 % rectal cream Place 1 Application rectally 2 (two) times daily.   Semaglutide-Weight Management (WEGOVY) 0.5 MG/0.5ML SOAJ Inject 0.5 mg into the skin once a week.   venlafaxine XR (EFFEXOR-XR) 150 MG 24 hr capsule Take 150 mg by mouth every morning.   No facility-administered encounter medications on file as of 08/15/2022.    Allergies (verified) Niacin and related   History: Past Medical History:  Diagnosis Date   Bowel trouble 2010   Depression, major, recurrent, moderate 10/26/2014   Hyperlipidemia, mild 02/06/2015   TC 218 TG 66 HDL 60  LDL 146    Motion sickness     boats   Shoulder strain, right, initial encounter 09/13/2016   Special screening for malignant neoplasms, colon 2012   Past Surgical History:  Procedure Laterality Date   CATARACT EXTRACTION W/PHACO Right 09/02/2017   Procedure: CATARACT EXTRACTION PHACO AND INTRAOCULAR LENS PLACEMENT (IOC) RIGHT SYMFONY TORIC;  Surgeon: Lockie Mola, MD;  Location: [facility];  Service: Ophthalmology;  Laterality: Right;   CATARACT EXTRACTION W/PHACO Left 10/30/2017   Procedure: CATARACT EXTRACTION PHACO AND INTRAOCULAR LENS PLACEMENT (IOC)  LEFT SYMFONY LENS;  Surgeon: Lockie Mola, MD;  Location: [facility];  Service: Ophthalmology;  Laterality: Left;   COLONOSCOPY W/ POLYPECTOMY  2012   tubular adenoma 0.5cm was removed from descending colon   FACIAL COSMETIC SURGERY  2015   PARTIAL HYSTERECTOMY     bleeding; cervix and ovary remains   Family History  Problem Relation Age of Onset   COPD Mother    Social History   Socioeconomic History   Marital status: Widowed    Spouse name: Not on file   Number of children: Not on file   Years of education: Not on file   Highest education level: Not on file  Occupational History   Not on file  Tobacco Use   Smoking status: Never   Smokeless tobacco: Never  Vaping Use   Vaping Use: Never used  Substance and Sexual Activity   Alcohol use: No    Alcohol/week:  0.0 standard drinks of alcohol   Drug use: No   Sexual activity: Not Currently    Birth control/protection: Surgical  Other Topics Concern   Not on file  Social History Narrative   Not on file   Social Determinants of Health   Financial Resource Strain: Not on file  Food Insecurity: Not on file  Transportation Needs: Not on file  Physical Activity: Not on file  Stress: Not on file  Social Connections: Not on file    Tobacco Counseling Counseling given: Not Answered   Clinical Intake:                 Diabetic?NO         Activities of  Daily Living    08/23/2021    3:16 PM  In your present state of health, do you have any difficulty performing the following activities:  Hearing? 0  Vision? 0  Difficulty concentrating or making decisions? 0  Walking or climbing stairs? 0  Dressing or bathing? 0  Doing errands, shopping? 0    Patient Care Team: Reubin Milan, MD as PCP - General (Internal Medicine) Jesusita Oka, MD (Dermatology)  Indicate any recent Medical Services you may have received from other than [facility] providers in the past year (date may be approximate).     Assessment:   This is a routine wellness examination for Desiree Grant.  Hearing/Vision screen No results found.  Dietary issues and exercise activities discussed:     Goals Addressed   None   Depression Screen    07/03/2022    1:40 PM 09/25/2021   10:51 AM 08/23/2021    3:16 PM 10/31/2020   10:46 AM 09/20/2020   10:42 AM 04/25/2020   11:09 AM 03/08/2020    3:30 PM  PHQ 2/9 Scores  PHQ - 2 Score 2 2 3 3  0 0 6  PHQ- 9 Score 9 6 4 8 6  0 20    Fall Risk    07/03/2022    1:40 PM 09/25/2021   10:51 AM 08/23/2021    3:16 PM 10/31/2020   10:47 AM 09/20/2020   10:43 AM  Fall Risk   Falls in the past year? 0 0 0 0 0  Number falls in past yr: 0 0 0  0  Injury with Fall? 0 0 0  0  Risk for fall due to : No Fall Risks No Fall Risks No Fall Risks    Follow up Falls evaluation completed Falls evaluation completed Falls evaluation completed Falls evaluation completed Falls evaluation completed    FALL RISK PREVENTION PERTAINING TO THE HOME:  Any stairs in or around the home? No  If so, are there any without handrails? No  Home free of loose throw rugs in walkways, pet beds, electrical cords, etc? Yes  Adequate lighting in your home to reduce risk of falls? Yes   ASSISTIVE DEVICES UTILIZED TO PREVENT FALLS:  Life alert? No  Use of a cane, walker or w/c? No  Grab bars in the bathroom? No  Shower chair or bench in shower? No  Elevated  toilet seat or a handicapped toilet? No   TIMED UP AND GO:  Was the test performed? No .  Length of time to ambulate 10 feet: N/A sec.     Cognitive Function:        Immunizations Immunization History  Administered Date(s) Administered   Influenza,inj,Quad PF,6+ Mos 02/07/2015, 02/08/2016, 01/23/2018, 02/08/2020   Janssen (J&J) SARS-COV-2 Vaccination 08/06/2019  Moderna SARS-COV2 Booster Vaccination 12/02/2020    TDAP status: Due, Education has been provided regarding the importance of this vaccine. Advised may receive this vaccine at local pharmacy or Health Dept. Aware to provide a copy of the vaccination record if obtained from local pharmacy or Health Dept. Verbalized acceptance and understanding.  Flu Vaccine status: Due, Education has been provided regarding the importance of this vaccine. Advised may receive this vaccine at local pharmacy or Health Dept. Aware to provide a copy of the vaccination record if obtained from local pharmacy or Health Dept. Verbalized acceptance and understanding.  Pneumococcal vaccine status: Due, Education has been provided regarding the importance of this vaccine. Advised may receive this vaccine at local pharmacy or Health Dept. Aware to provide a copy of the vaccination record if obtained from local pharmacy or Health Dept. Verbalized acceptance and understanding.  Covid-19 vaccine status: Completed vaccines  Qualifies for Shingles Vaccine? Yes   Zostavax completed No   Shingrix Completed?: No.    Education has been provided regarding the importance of this vaccine. Patient has been advised to call insurance company to determine out of pocket expense if they have not yet received this vaccine. Advised may also receive vaccine at local pharmacy or Health Dept. Verbalized acceptance and understanding.  Screening Tests Health Maintenance  Topic Date Due   HIV Screening  Never done   DTaP/Tdap/Td (1 - Tdap) Never done   Zoster Vaccines-  Shingrix (1 of 2) Never done   COVID-19 Vaccine (2 - Janssen risk series) 12/30/2020   COLONOSCOPY (Pts 45-63yrs Insurance coverage will need to be confirmed)  05/08/2021   Pneumonia Vaccine 44+ Years old (1 of 1 - PCV) Never done   DEXA SCAN  Never done   COLON CANCER SCREENING ANNUAL FOBT  10/12/2022   INFLUENZA VACCINE  12/06/2022   MAMMOGRAM  07/05/2023   Medicare Annual Wellness (AWV)  08/15/2023   PAP SMEAR-Modifier  09/20/2025   Hepatitis C Screening  Completed   HPV VACCINES  Aged Out    Health Maintenance  Health Maintenance Due  Topic Date Due   HIV Screening  Never done   DTaP/Tdap/Td (1 - Tdap) Never done   Zoster Vaccines- Shingrix (1 of 2) Never done   COVID-19 Vaccine (2 - Janssen risk series) 12/30/2020   COLONOSCOPY (Pts 45-79yrs Insurance coverage will need to be confirmed)  05/08/2021   Pneumonia Vaccine 28+ Years old (1 of 1 - PCV) Never done   DEXA SCAN  Never done    Colorectal cancer screening: Type of screening: Colonoscopy. Completed 05/09/2011. Repeat every 10 years  Mammogram status: Completed 07/05/2022. Repeat every year    Lung Cancer Screening: (Low Dose CT Chest recommended if Age 20-80 years, 30 pack-year currently smoking OR have quit w/in 15years.) does not qualify.   Lung Cancer Screening Referral: NO  Additional Screening:  Hepatitis C Screening: does qualify; Completed 02/08/2016  Vision Screening: Recommended annual ophthalmology exams for early detection of glaucoma and other disorders of the eye. Is the patient up to date with their annual eye exam?  Yes  Who is the provider or what is the name of the office in which the patient attends annual eye exams? [facility] If pt is not established with a provider, would they like to be referred to a provider to establish care? No .   Dental Screening: Recommended annual dental exams for proper oral hygiene  Community Resource Referral / Chronic Care Management: CRR required this  visit?  No   CCM required this visit?  No      Plan:     I have personally reviewed and noted the following in the patient's chart:   Medical and social history Use of alcohol, tobacco or illicit drugs  Current medications and supplements including opioid prescriptions. Patient is not currently taking opioid prescriptions. Functional ability and status Nutritional status Physical activity Advanced directives List of other physicians Hospitalizations, surgeries, and ER visits in previous 12 months Vitals Screenings to include cognitive, depression, and falls Referrals and appointments  In addition, I have reviewed and discussed with patient certain preventive protocols, quality metrics, and best practice recommendations. A written personalized care plan for preventive services as well as general preventive health recommendations were provided to patient.     Eulis CannerKieandra N Tyaisha Cullom, CMA   08/15/2022   Nurse Notes: none

## 2022-08-28 ENCOUNTER — Encounter: Payer: Self-pay | Admitting: Obstetrics and Gynecology

## 2022-08-28 ENCOUNTER — Ambulatory Visit (INDEPENDENT_AMBULATORY_CARE_PROVIDER_SITE_OTHER): Payer: Medicare Other | Admitting: Obstetrics and Gynecology

## 2022-08-28 VITALS — BP 121/75 | HR 80 | Ht 64.0 in | Wt 143.0 lb

## 2022-08-28 DIAGNOSIS — N816 Rectocele: Secondary | ICD-10-CM

## 2022-08-28 DIAGNOSIS — N3941 Urge incontinence: Secondary | ICD-10-CM | POA: Diagnosis not present

## 2022-08-28 DIAGNOSIS — R3915 Urgency of urination: Secondary | ICD-10-CM

## 2022-08-28 DIAGNOSIS — Z01818 Encounter for other preprocedural examination: Secondary | ICD-10-CM | POA: Diagnosis not present

## 2022-08-28 DIAGNOSIS — N814 Uterovaginal prolapse, unspecified: Secondary | ICD-10-CM | POA: Diagnosis not present

## 2022-08-28 DIAGNOSIS — N8110 Cystocele, unspecified: Secondary | ICD-10-CM

## 2022-08-28 LAB — POCT URINALYSIS DIPSTICK
Bilirubin, UA: NEGATIVE
Blood, UA: NEGATIVE
Glucose, UA: NEGATIVE
Ketones, UA: NEGATIVE
Leukocytes, UA: NEGATIVE
Nitrite, UA: NEGATIVE
Protein, UA: NEGATIVE
Spec Grav, UA: <=1.005 — AB
Urobilinogen, UA: 0.2 U/dL
pH, UA: 6.5

## 2022-08-28 MED ORDER — ESTRADIOL 0.1 MG/GM VA CREA: 0.2500 | TOPICAL_CREAM | Freq: Every day | VAGINAL | 0 refills | Status: DC

## 2022-08-28 MED ORDER — METRONIDAZOLE 500 MG PO TABS: 500.0000 mg | ORAL_TABLET | Freq: Two times a day (BID) | ORAL | 0 refills | Status: DC

## 2022-08-28 NOTE — H&P (View-Only) (Signed)
     PRE-OPERATIVE HISTORY AND PHYSICAL EXAM  PCP:  Berglund, Laura H, MD Subjective:   HPI:  Desiree Grant is a 67 y.o. G3P2.  No LMP recorded. Patient has had a hysterectomy.  She presents today for a pre-op discussion and PE.  She has the following symptoms: She has back pain, difficulty with bowel movements and expressing her stool, some urine loss.  Physical exam reveals cystocele rectocele.  Review of Systems:   Constitutional: Denied constitutional symptoms, night sweats, recent illness, fatigue, fever, insomnia and weight loss.  Eyes: Denied eye symptoms, eye pain, photophobia, vision change and visual disturbance.  Ears/Nose/Throat/Neck: Denied ear, nose, throat or neck symptoms, hearing loss, nasal discharge, sinus congestion and sore throat.  Cardiovascular: Denied cardiovascular symptoms, arrhythmia, chest pain/pressure, edema, exercise intolerance, orthopnea and palpitations.  Respiratory: Denied pulmonary symptoms, asthma, pleuritic pain, productive sputum, cough, dyspnea and wheezing.  Gastrointestinal: Denied, gastro-esophageal reflux, melena, nausea and vomiting.  Genitourinary: See HPI for additional information.  Musculoskeletal: Denied musculoskeletal symptoms, stiffness, swelling, muscle weakness and myalgia.  Dermatologic: Denied dermatology symptoms, rash and scar.  Neurologic: Denied neurology symptoms, dizziness, headache, neck pain and syncope.  Psychiatric: Denied psychiatric symptoms, anxiety and depression.  Endocrine: Denied endocrine symptoms including hot flashes and night sweats.   OB History  Gravida Para Term Preterm AB Living  3 2          SAB IAB Ectopic Multiple Live Births               # Outcome Date GA Lbr Len/2nd Weight Sex Delivery Anes PTL Lv  3 Gravida           2 Para           1 Para             Past Medical History:  Diagnosis Date   Bowel trouble 2010   Depression, major, recurrent, moderate 10/26/2014   Hyperlipidemia, mild  02/06/2015   TC 218 TG 66 HDL 60  LDL 146    Motion sickness    boats   Shoulder strain, right, initial encounter 09/13/2016   Special screening for malignant neoplasms, colon 2012    Past Surgical History:  Procedure Laterality Date   CATARACT EXTRACTION W/PHACO Right 09/02/2017   Procedure: CATARACT EXTRACTION PHACO AND INTRAOCULAR LENS PLACEMENT (IOC) RIGHT SYMFONY TORIC;  Surgeon: Brasington, Chadwick, MD;  Location: [facility];  Service: Ophthalmology;  Laterality: Right;   CATARACT EXTRACTION W/PHACO Left 10/30/2017   Procedure: CATARACT EXTRACTION PHACO AND INTRAOCULAR LENS PLACEMENT (IOC)  LEFT SYMFONY LENS;  Surgeon: Brasington, Chadwick, MD;  Location: [facility];  Service: Ophthalmology;  Laterality: Left;   COLONOSCOPY W/ POLYPECTOMY  2012   tubular adenoma 0.5cm was removed from descending colon   FACIAL COSMETIC SURGERY  2015   PARTIAL HYSTERECTOMY     bleeding; cervix and ovary remains      SOCIAL HISTORY:  Social History   Tobacco Use  Smoking Status Never  Smokeless Tobacco Never   Social History   Substance and Sexual Activity  Alcohol Use No   Alcohol/week: 0.0 standard drinks of alcohol    Social History   Substance and Sexual Activity  Drug Use No    Family History  Problem Relation Age of Onset   COPD Mother     ALLERGIES:  Niacin and related  MEDS:   Current Outpatient Medications on File Prior to Visit  Medication Sig Dispense Refill   ARIPiprazole (  ABILIFY) 5 MG tablet Take by mouth daily.     buPROPion ER (WELLBUTRIN SR) 100 MG 12 hr tablet Take 100 mg by mouth every morning.     venlafaxine XR (EFFEXOR-XR) 150 MG 24 hr capsule Take 150 mg by mouth every morning.     No current facility-administered medications on file prior to visit.    Meds ordered this encounter  Medications   estradiol (ESTRACE) 0.1 MG/GM vaginal cream    Sig: Place 0.25 Applicatorfuls vaginally at bedtime.    Dispense:  90 g    Refill:  0    metroNIDAZOLE (FLAGYL) 500 MG tablet    Sig: Take 1 tablet (500 mg total) by mouth 2 (two) times daily. Begin 5 days prior to scheduled surgery as directed.    Dispense:  10 tablet    Refill:  0     Physical examination BP 121/75   Pulse 80   Ht 5' 4" (1.626 m)   Wt 143 lb (64.9 kg)   BMI 24.55 kg/m   General NAD, Conversant  HEENT Atraumatic; Op clear with mmm.  Normo-cephalic. Pupils reactive. Anicteric sclerae  Thyroid/Neck Smooth without nodularity or enlargement. Normal ROM.  Neck Supple.  Skin No rashes, lesions or ulceration. Normal palpated skin turgor. No nodularity.  Breasts: No masses or discharge.  Symmetric.  No axillary adenopathy.  Lungs: Clear to auscultation.No rales or wheezes. Normal Respiratory effort, no retractions.  Heart: NSR.  No murmurs or rubs appreciated. No peripheral edema  Abdomen: Soft.  Non-tender.  No masses.  No HSM. No hernia  Extremities: Moves all appropriately.  Normal ROM for age. No lymphadenopathy.  Neuro: Oriented to PPT.  Normal mood. Normal affect.     Pelvic:   Vulva: Normal appearance.  No lesions.  Vagina: No lesions or abnormalities noted.  Support: Third-degree rectocele second-degree cystocele  Urethra No masses tenderness or scarring.  Meatus Normal size without lesions or prolapse.  Cervix: Surgically absent  Anus: Normal exam.  No lesions.  Perineum: Normal exam.  No lesions.        Bimanual   Uterus: Surgically absent  Adnexae: No masses.  Non-tender to palpation.  Cul-de-sac: Negative for abnormality.   Assessment:   G3P2 Patient Active Problem List   Diagnosis Date Noted   Cervical adenopathy 03/21/2021   Chronic rhinitis 02/24/2021   Cough variant asthma with component of UACS  01/13/2021   Lipoma of right lower extremity 03/08/2020   Lumbar degenerative disc disease 11/02/2019   Gastroesophageal reflux disease without esophagitis 09/10/2018   Neoplasm of uncertain behavior of skin 09/13/2016   Muscle cramp,  nocturnal 09/13/2016   Adenomatous colon polyp 02/07/2015   Hyperlipidemia, mild 02/06/2015   Fibrocystic breast changes 10/26/2014   Depression, major, recurrent, moderate 10/26/2014    1. Pre-op exam   2. Rectocele   3. Urge incontinence   4. Female cystocele   5. Urinary urgency      Plan:   Orders: Meds ordered this encounter  Medications   estradiol (ESTRACE) 0.1 MG/GM vaginal cream    Sig: Place 0.25 Applicatorfuls vaginally at bedtime.    Dispense:  90 g    Refill:  0   metroNIDAZOLE (FLAGYL) 500 MG tablet    Sig: Take 1 tablet (500 mg total) by mouth 2 (two) times daily. Begin 5 days prior to scheduled surgery as directed.    Dispense:  10 tablet    Refill:  0     1.  A&P repair,   TOT   

## 2022-08-28 NOTE — H&P (Signed)
PRE-OPERATIVE HISTORY AND PHYSICAL EXAM  PCP:  Reubin Milan, MD Subjective:   HPI:  Desiree Grant is a 67 y.o. G3P2.  No LMP recorded. Patient has had a hysterectomy.  She presents today for a pre-op discussion and PE.  She has the following symptoms: She has back pain, difficulty with bowel movements and expressing her stool, some urine loss.  Physical exam reveals cystocele rectocele.  Review of Systems:   Constitutional: Denied constitutional symptoms, night sweats, recent illness, fatigue, fever, insomnia and weight loss.  Eyes: Denied eye symptoms, eye pain, photophobia, vision change and visual disturbance.  Ears/Nose/Throat/Neck: Denied ear, nose, throat or neck symptoms, hearing loss, nasal discharge, sinus congestion and sore throat.  Cardiovascular: Denied cardiovascular symptoms, arrhythmia, chest pain/pressure, edema, exercise intolerance, orthopnea and palpitations.  Respiratory: Denied pulmonary symptoms, asthma, pleuritic pain, productive sputum, cough, dyspnea and wheezing.  Gastrointestinal: Denied, gastro-esophageal reflux, melena, nausea and vomiting.  Genitourinary: See HPI for additional information.  Musculoskeletal: Denied musculoskeletal symptoms, stiffness, swelling, muscle weakness and myalgia.  Dermatologic: Denied dermatology symptoms, rash and scar.  Neurologic: Denied neurology symptoms, dizziness, headache, neck pain and syncope.  Psychiatric: Denied psychiatric symptoms, anxiety and depression.  Endocrine: Denied endocrine symptoms including hot flashes and night sweats.   OB History  Gravida Para Term Preterm AB Living  3 2          SAB IAB Ectopic Multiple Live Births               # Outcome Date GA Lbr Len/2nd Weight Sex Delivery Anes PTL Lv  3 Gravida           2 Para           1 Para             Past Medical History:  Diagnosis Date   Bowel trouble 2010   Depression, major, recurrent, moderate 10/26/2014   Hyperlipidemia, mild  02/06/2015   TC 218 TG 66 HDL 60  LDL 146    Motion sickness    boats   Shoulder strain, right, initial encounter 09/13/2016   Special screening for malignant neoplasms, colon 2012    Past Surgical History:  Procedure Laterality Date   CATARACT EXTRACTION W/PHACO Right 09/02/2017   Procedure: CATARACT EXTRACTION PHACO AND INTRAOCULAR LENS PLACEMENT (IOC) RIGHT SYMFONY TORIC;  Surgeon: Lockie Mola, MD;  Location: [facility];  Service: Ophthalmology;  Laterality: Right;   CATARACT EXTRACTION W/PHACO Left 10/30/2017   Procedure: CATARACT EXTRACTION PHACO AND INTRAOCULAR LENS PLACEMENT (IOC)  LEFT SYMFONY LENS;  Surgeon: Lockie Mola, MD;  Location: [facility];  Service: Ophthalmology;  Laterality: Left;   COLONOSCOPY W/ POLYPECTOMY  2012   tubular adenoma 0.5cm was removed from descending colon   FACIAL COSMETIC SURGERY  2015   PARTIAL HYSTERECTOMY     bleeding; cervix and ovary remains      SOCIAL HISTORY:  Social History   Tobacco Use  Smoking Status Never  Smokeless Tobacco Never   Social History   Substance and Sexual Activity  Alcohol Use No   Alcohol/week: 0.0 standard drinks of alcohol    Social History   Substance and Sexual Activity  Drug Use No    Family History  Problem Relation Age of Onset   COPD Mother     ALLERGIES:  Niacin and related  MEDS:   Current Outpatient Medications on File Prior to Visit  Medication Sig Dispense Refill   ARIPiprazole (  ABILIFY) 5 MG tablet Take by mouth daily.     buPROPion ER (WELLBUTRIN SR) 100 MG 12 hr tablet Take 100 mg by mouth every morning.     venlafaxine XR (EFFEXOR-XR) 150 MG 24 hr capsule Take 150 mg by mouth every morning.     No current facility-administered medications on file prior to visit.    Meds ordered this encounter  Medications   estradiol (ESTRACE) 0.1 MG/GM vaginal cream    Sig: Place 0.25 Applicatorfuls vaginally at bedtime.    Dispense:  90 g    Refill:  0    metroNIDAZOLE (FLAGYL) 500 MG tablet    Sig: Take 1 tablet (500 mg total) by mouth 2 (two) times daily. Begin 5 days prior to scheduled surgery as directed.    Dispense:  10 tablet    Refill:  0     Physical examination BP 121/75   Pulse 80   Ht  (1.626 m)   Wt 143 lb (64.9 kg)   BMI 24.55 kg/m   General NAD, Conversant  HEENT Atraumatic; Op clear with mmm.  Normo-cephalic. Pupils reactive. Anicteric sclerae  Thyroid/Neck Smooth without nodularity or enlargement. Normal ROM.  Neck Supple.  Skin No rashes, lesions or ulceration. Normal palpated skin turgor. No nodularity.  Breasts: No masses or discharge.  Symmetric.  No axillary adenopathy.  Lungs: Clear to auscultation.No rales or wheezes. Normal Respiratory effort, no retractions.  Heart: NSR.  No murmurs or rubs appreciated. No peripheral edema  Abdomen: Soft.  Non-tender.  No masses.  No HSM. No hernia  Extremities: Moves all appropriately.  Normal ROM for age. No lymphadenopathy.  Neuro: Oriented to PPT.  Normal mood. Normal affect.     Pelvic:   Vulva: Normal appearance.  No lesions.  Vagina: No lesions or abnormalities noted.  Support: Third-degree rectocele second-degree cystocele  Urethra No masses tenderness or scarring.  Meatus Normal size without lesions or prolapse.  Cervix: Surgically absent  Anus: Normal exam.  No lesions.  Perineum: Normal exam.  No lesions.        Bimanual   Uterus: Surgically absent  Adnexae: No masses.  Non-tender to palpation.  Cul-de-sac: Negative for abnormality.   Assessment:   G3P2 Patient Active Problem List   Diagnosis Date Noted   Cervical adenopathy 03/21/2021   Chronic rhinitis 02/24/2021   Cough variant asthma with component of UACS  01/13/2021   Lipoma of right lower extremity 03/08/2020   Lumbar degenerative disc disease 11/02/2019   Gastroesophageal reflux disease without esophagitis 09/10/2018   Neoplasm of uncertain behavior of skin 09/13/2016   Muscle cramp,  nocturnal 09/13/2016   Adenomatous colon polyp 02/07/2015   Hyperlipidemia, mild 02/06/2015   Fibrocystic breast changes 10/26/2014   Depression, major, recurrent, moderate 10/26/2014    1. Pre-op exam   2. Rectocele   3. Urge incontinence   4. Female cystocele   5. Urinary urgency      Plan:   Orders: Meds ordered this encounter  Medications   estradiol (ESTRACE) 0.1 MG/GM vaginal cream    Sig: Place 0.25 Applicatorfuls vaginally at bedtime.    Dispense:  90 g    Refill:  0   metroNIDAZOLE (FLAGYL) 500 MG tablet    Sig: Take 1 tablet (500 mg total) by mouth 2 (two) times daily. Begin 5 days prior to scheduled surgery as directed.    Dispense:  10 tablet    Refill:  0     1.  A&P repair,  TOT

## 2022-08-28 NOTE — Progress Notes (Signed)
PRE-OPERATIVE HISTORY AND PHYSICAL EXAM  PCP:  Reubin Milan, MD Subjective:   HPI:  Desiree Grant is a 67 y.o. G3P2.  No LMP recorded. Patient has had a hysterectomy.  She presents today for a pre-op discussion and PE.  She has the following symptoms: She has back pain, difficulty with bowel movements and expressing her stool, some urine loss.  Physical exam reveals cystocele rectocele.  Review of Systems:   Constitutional: Denied constitutional symptoms, night sweats, recent illness, fatigue, fever, insomnia and weight loss.  Eyes: Denied eye symptoms, eye pain, photophobia, vision change and visual disturbance.  Ears/Nose/Throat/Neck: Denied ear, nose, throat or neck symptoms, hearing loss, nasal discharge, sinus congestion and sore throat.  Cardiovascular: Denied cardiovascular symptoms, arrhythmia, chest pain/pressure, edema, exercise intolerance, orthopnea and palpitations.  Respiratory: Denied pulmonary symptoms, asthma, pleuritic pain, productive sputum, cough, dyspnea and wheezing.  Gastrointestinal: Denied, gastro-esophageal reflux, melena, nausea and vomiting.  Genitourinary: See HPI for additional information.  Musculoskeletal: Denied musculoskeletal symptoms, stiffness, swelling, muscle weakness and myalgia.  Dermatologic: Denied dermatology symptoms, rash and scar.  Neurologic: Denied neurology symptoms, dizziness, headache, neck pain and syncope.  Psychiatric: Denied psychiatric symptoms, anxiety and depression.  Endocrine: Denied endocrine symptoms including hot flashes and night sweats.   OB History  Gravida Para Term Preterm AB Living  3 2          SAB IAB Ectopic Multiple Live Births               # Outcome Date GA Lbr Len/2nd Weight Sex Delivery Anes PTL Lv  3 Gravida           2 Para           1 Para             Past Medical History:  Diagnosis Date   Bowel trouble 2010   Depression, major, recurrent, moderate 10/26/2014   Hyperlipidemia, mild  02/06/2015   TC 218 TG 66 HDL 60  LDL 146    Motion sickness    boats   Shoulder strain, right, initial encounter 09/13/2016   Special screening for malignant neoplasms, colon 2012    Past Surgical History:  Procedure Laterality Date   CATARACT EXTRACTION W/PHACO Right 09/02/2017   Procedure: CATARACT EXTRACTION PHACO AND INTRAOCULAR LENS PLACEMENT (IOC) RIGHT SYMFONY TORIC;  Surgeon: Lockie Mola, MD;  Location: [facility];  Service: Ophthalmology;  Laterality: Right;   CATARACT EXTRACTION W/PHACO Left 10/30/2017   Procedure: CATARACT EXTRACTION PHACO AND INTRAOCULAR LENS PLACEMENT (IOC)  LEFT SYMFONY LENS;  Surgeon: Lockie Mola, MD;  Location: [facility];  Service: Ophthalmology;  Laterality: Left;   COLONOSCOPY W/ POLYPECTOMY  2012   tubular adenoma 0.5cm was removed from descending colon   FACIAL COSMETIC SURGERY  2015   PARTIAL HYSTERECTOMY     bleeding; cervix and ovary remains      SOCIAL HISTORY:  Social History   Tobacco Use  Smoking Status Never  Smokeless Tobacco Never   Social History   Substance and Sexual Activity  Alcohol Use No   Alcohol/week: 0.0 standard drinks of alcohol    Social History   Substance and Sexual Activity  Drug Use No    Family History  Problem Relation Age of Onset   COPD Mother     ALLERGIES:  Niacin and related  MEDS:   Current Outpatient Medications on File Prior to Visit  Medication Sig Dispense Refill   ARIPiprazole (  ABILIFY) 5 MG tablet Take by mouth daily.     buPROPion ER (WELLBUTRIN SR) 100 MG 12 hr tablet Take 100 mg by mouth every morning.     venlafaxine XR (EFFEXOR-XR) 150 MG 24 hr capsule Take 150 mg by mouth every morning.     No current facility-administered medications on file prior to visit.    Meds ordered this encounter  Medications   estradiol (ESTRACE) 0.1 MG/GM vaginal cream    Sig: Place 0.25 Applicatorfuls vaginally at bedtime.    Dispense:  90 g    Refill:  0    metroNIDAZOLE (FLAGYL) 500 MG tablet    Sig: Take 1 tablet (500 mg total) by mouth 2 (two) times daily. Begin 5 days prior to scheduled surgery as directed.    Dispense:  10 tablet    Refill:  0     Physical examination BP 121/75   Pulse 80   Ht  (1.626 m)   Wt 143 lb (64.9 kg)   BMI 24.55 kg/m   General NAD, Conversant  HEENT Atraumatic; Op clear with mmm.  Normo-cephalic. Pupils reactive. Anicteric sclerae  Thyroid/Neck Smooth without nodularity or enlargement. Normal ROM.  Neck Supple.  Skin No rashes, lesions or ulceration. Normal palpated skin turgor. No nodularity.  Breasts: No masses or discharge.  Symmetric.  No axillary adenopathy.  Lungs: Clear to auscultation.No rales or wheezes. Normal Respiratory effort, no retractions.  Heart: NSR.  No murmurs or rubs appreciated. No peripheral edema  Abdomen: Soft.  Non-tender.  No masses.  No HSM. No hernia  Extremities: Moves all appropriately.  Normal ROM for age. No lymphadenopathy.  Neuro: Oriented to PPT.  Normal mood. Normal affect.     Pelvic:   Vulva: Normal appearance.  No lesions.  Vagina: No lesions or abnormalities noted.  Support: Third-degree rectocele second-degree cystocele  Urethra No masses tenderness or scarring.  Meatus Normal size without lesions or prolapse.  Cervix: Surgically absent  Anus: Normal exam.  No lesions.  Perineum: Normal exam.  No lesions.        Bimanual   Uterus: Surgically absent  Adnexae: No masses.  Non-tender to palpation.  Cul-de-sac: Negative for abnormality.   Assessment:   G3P2 Patient Active Problem List   Diagnosis Date Noted   Cervical adenopathy 03/21/2021   Chronic rhinitis 02/24/2021   Cough variant asthma with component of UACS  01/13/2021   Lipoma of right lower extremity 03/08/2020   Lumbar degenerative disc disease 11/02/2019   Gastroesophageal reflux disease without esophagitis 09/10/2018   Neoplasm of uncertain behavior of skin 09/13/2016   Muscle cramp,  nocturnal 09/13/2016   Adenomatous colon polyp 02/07/2015   Hyperlipidemia, mild 02/06/2015   Fibrocystic breast changes 10/26/2014   Depression, major, recurrent, moderate 10/26/2014    1. Pre-op exam   2. Rectocele   3. Urge incontinence   4. Female cystocele   5. Urinary urgency      Plan:   Orders: Meds ordered this encounter  Medications   estradiol (ESTRACE) 0.1 MG/GM vaginal cream    Sig: Place 0.25 Applicatorfuls vaginally at bedtime.    Dispense:  90 g    Refill:  0   metroNIDAZOLE (FLAGYL) 500 MG tablet    Sig: Take 1 tablet (500 mg total) by mouth 2 (two) times daily. Begin 5 days prior to scheduled surgery as directed.    Dispense:  10 tablet    Refill:  0     1.  A&P repair,  TOT   Pre-op discussions regarding Risks and Benefits of her scheduled surgery.  Anterior Repair I have discussed the procedure of anterior repair and Kelly placation.  I have informed the patient that this procedure often corrects or improves stress urinary incontinence, but that there is certainly no guarantee of her improvement.  The procedure itself was discussed.  The possible damage to the bowel, ureters or urethra was also discussed.  We have reviewed the repositioning of the bladder that often takes place at anterior repair and I have informed her that although unlikely, it is possible that a worsening of her incontinence could occur after this procedure.  I have also discussed with her the necessity of decreased lifting and physical activity following the procedure as well as the possibility that as she gets older, her stress urinary incontinence could slowly return.  The use of vaginal Estrogen or oral Estrogen as well as other medications in the role of both stress and bladder dysynergia incontinence were discussed.  I have discussed the complication of inability to void immediately following the procedure.  The patient is aware that she may go home using a Foley catheter or may be  taught the technique of self-catheterization should a complication develop.  All of her questions have been answered and I believe that she has an informed understanding of anterior repair/Kelly plication. Posterior Repair Posterior repair was discussed with the patient.  The risks were reviewed and include:  possible damage to rectum and bowel, bleeding, infection and anesthesia.  The benefits were also discussed.  Post-op recovery with special attention to hospital stay and return to sexual function were specifically reviewed.  All her questions were answered, and I believe that she has an informed understanding of Posterior repair.  TOT I have discussed the procedure of tension free vaginal tape using the trans-obturator approach. (TOT).  I have informed the patient that this procedure often corrects or improves stress urinary incontinence.  For patients without urinary incontinence-this procedure is often performed to lower the risk of iatragenic urinary incontinence at the time of cystocele repair.The patient has been made aware that there is no guarantee of her improvement or the length of time her improvement will last.  The procedure itself was discussed in detail including possible damage to bowel, ureters, urethra and bladder.  I have informed her that the mesh is permanent.  The risk of extrusion of the mesh has also been reviewed.  The management of this complication has been discussed.  The risks of bleeding and infection were also reviewed.  I have specifically discussed the complication of inability to void following the procedure and the patient is aware that she may go home using a Foley catheter or using a self-catheterization technique.  In addition, I have discussed with the patient the use of cystoscopy to diagnose bladder injury should there be any question of this.    Regarding the polypropylene mesh and mid-urethral slings: I have review the indication for TOT and the method of  performing the surgery.  All of her questions were answered.  She has been advised that should she have any additional questions or concerns regarding her sling procedure we would be happy to schedule a time before her surgery to discuss them. All of the patient's questions have been answered and I believe she has an informed understanding of TOT.  I spent 40 minutes involved in the care of this patient preparing to see the patient by obtaining and reviewing her medical  history (including labs, imaging tests and prior procedures), documenting clinical information in the electronic health record (EHR), counseling and coordinating care plans, writing and sending prescriptions, ordering tests or procedures and in direct communicating with the patient and medical staff discussing pertinent items from her history and physical exam.  Elonda Husky, M.D. 08/28/2022 1:51 PM

## 2022-08-28 NOTE — Progress Notes (Signed)
Patient presents today for a pre-op exam prior to AR/PR with TOT. She states recently having back pain and increased urinary urge. UA today, negative.

## 2022-08-29 ENCOUNTER — Encounter: Payer: Self-pay | Admitting: Obstetrics and Gynecology

## 2022-08-29 ENCOUNTER — Encounter: Payer: Self-pay | Admitting: Internal Medicine

## 2022-08-30 ENCOUNTER — Other Ambulatory Visit: Payer: Self-pay | Admitting: Internal Medicine

## 2022-08-30 ENCOUNTER — Telehealth: Payer: Self-pay

## 2022-08-30 DIAGNOSIS — F331 Major depressive disorder, recurrent, moderate: Secondary | ICD-10-CM

## 2022-08-30 MED ORDER — ARIPIPRAZOLE 5 MG PO TABS: 5.0000 mg | ORAL_TABLET | Freq: Every day | ORAL | 0 refills | Status: DC

## 2022-08-30 MED ORDER — VENLAFAXINE HCL ER 150 MG PO CP24
150.0000 mg | ORAL_CAPSULE | Freq: Every morning | ORAL | 0 refills | Status: DC
Start: 2022-08-30 — End: 2022-12-19

## 2022-08-30 MED ORDER — BUPROPION HCL ER (SR) 100 MG PO TB12: 100.0000 mg | ORAL_TABLET | Freq: Every morning | ORAL | 0 refills | Status: DC

## 2022-08-30 NOTE — Telephone Encounter (Signed)
Vernon Prey spoke with patient.

## 2022-08-30 NOTE — Telephone Encounter (Signed)
Patient contacted office stating that she missed a call, patient states no voice message left. Please reach back out to patient. KW

## 2022-09-01 ENCOUNTER — Encounter: Payer: Self-pay | Admitting: Internal Medicine

## 2022-09-03 ENCOUNTER — Other Ambulatory Visit: Payer: Self-pay | Admitting: Internal Medicine

## 2022-09-03 DIAGNOSIS — F331 Major depressive disorder, recurrent, moderate: Secondary | ICD-10-CM

## 2022-09-03 MED ORDER — ARIPIPRAZOLE 2 MG PO TABS: 2.0000 mg | ORAL_TABLET | Freq: Every day | ORAL | 0 refills | Status: DC

## 2022-09-14 ENCOUNTER — Encounter
Admission: RE | Admit: 2022-09-14 | Discharge: 2022-09-14 | Disposition: A | Discharge status: Home / Self Care | Payer: Medicare Other | Source: Ambulatory Visit | Attending: Obstetrics and Gynecology | Admitting: Obstetrics and Gynecology

## 2022-09-14 ENCOUNTER — Other Ambulatory Visit: Payer: Self-pay

## 2022-09-14 VITALS — Ht 64.0 in | Wt 143.0 lb

## 2022-09-14 DIAGNOSIS — Z01818 Encounter for other preprocedural examination: Secondary | ICD-10-CM

## 2022-09-14 DIAGNOSIS — E785 Hyperlipidemia, unspecified: Secondary | ICD-10-CM

## 2022-09-14 HISTORY — DX: Anxiety disorder, unspecified: F41.9

## 2022-09-14 NOTE — Patient Instructions (Signed)
Your procedure is scheduled on: Monday 09/24/22 To find out your arrival time, please call [phone] between 1PM - 3PM on:   Friday 09/21/22 Report to the Registration Desk on the 1st floor of the [facility]. Free Valet parking is available.  If your arrival time is 6:00 am, do not arrive before that time as the [facility] entrance doors do not open until 6:00 am.  REMEMBER: Instructions that are not followed completely may result in serious medical risk, up to and including death; or upon the discretion of your surgeon and anesthesiologist your surgery may need to be rescheduled.  Do not eat food or drink any liquids after midnight the night before surgery.  No gum chewing or hard candies.  One week prior to surgery: Stop Anti-inflammatories (NSAIDS) such as Advil, Aleve, Ibuprofen, Motrin, Naproxen, Naprosyn and Aspirin based products such as Excedrin, Goody's Powder, BC Powder. You may however, continue to take Tylenol if needed for pain up until the day of surgery.  Stop ANY OVER THE COUNTER supplements or vitamins until after surgery.  Continue taking all prescribed medications,   TAKE ONLY THESE MEDICATIONS THE MORNING OF SURGERY WITH A SIP OF WATER:  ARIPiprazole (ABILIFY) 2 MG tablet  buPROPion ER (WELLBUTRIN SR) 100 MG 12 hr tablet  venlafaxine XR (EFFEXOR-XR) 150 MG 24 hr capsule   No Alcohol for 24 hours before or after surgery.  No Smoking including e-cigarettes for 24 hours before surgery.  No chewable tobacco products for at least 6 hours before surgery.  No nicotine patches on the day of surgery.  Do not use any "recreational" drugs for at least a week (preferably 2 weeks) before your surgery.  Please be advised that the combination of cocaine and anesthesia may have negative outcomes, up to and including death. If you test positive for cocaine, your surgery will be cancelled.  On the morning of surgery brush your teeth with toothpaste and water, you may  rinse your mouth with mouthwash if you wish. Do not swallow any toothpaste or mouthwash.  Use CHG Soap or wipes as directed on instruction sheet.  Do not wear lotions, powders, or perfumes.   Do not shave body hair from the neck down 48 hours before surgery.  Wear comfortable clothing (specific to your surgery type) to the hospital.  Do not wear jewelry, make-up, hairpins, clips or nail polish.  Contact lenses, hearing aids and dentures may not be worn into surgery.  Do not bring valuables to the hospital. [facility] is not responsible for any missing/lost belongings or valuables.   Notify your doctor if there is any change in your medical condition (cold, fever, infection).  If you are being discharged the day of surgery, you will not be allowed to drive home. You will need a responsible individual to drive you home and stay with you for 24 hours after surgery.   If you are taking public transportation, you will need to have a responsible individual with you.  If you are being admitted to the hospital overnight, leave your suitcase in the car. After surgery it may be brought to your room.  In case of increased patient census, it may be necessary for you, the patient, to continue your postoperative care in the Same Day Surgery department.  After surgery, you can help prevent lung complications by doing breathing exercises.  Take deep breaths and cough every 1-2 hours. Your doctor may order a device called an Incentive Spirometer to help you take  deep breaths. When coughing or sneezing, hold a pillow firmly against your incision with both hands. This is called "splinting." Doing this helps protect your incision. It also decreases belly discomfort.  Surgery Visitation Policy:  Patients undergoing a surgery or procedure may have two family members or support persons with them as long as the person is not COVID-19 positive or experiencing its symptoms.   Inpatient Visitation:     Visiting hours are 7 a.m. to 8 p.m. Up to four visitors are allowed at one time in a patient room. The visitors may rotate out with other people during the day. One designated support person (adult) may remain overnight.  Please call the Pre-admissions Testing Dept. at [phone] if you have any questions about these instructions.     Preparing for Surgery with CHLORHEXIDINE GLUCONATE (CHG) Soap  Chlorhexidine Gluconate (CHG) Soap  o An antiseptic cleaner that kills germs and bonds with the skin to continue killing germs even after washing  o Used for showering the night before surgery and morning of surgery  Before surgery, you can play an important role by reducing the number of germs on your skin.  CHG (Chlorhexidine gluconate) soap is an antiseptic cleanser which kills germs and bonds with the skin to continue killing germs even after washing.  Please do not use if you have an allergy to CHG or antibacterial soaps. If your skin becomes reddened/irritated stop using the CHG.  1. Shower the NIGHT BEFORE SURGERY and the MORNING OF SURGERY with CHG soap.  2. If you choose to wash your hair, wash your hair first as usual with your normal shampoo.  3. After shampooing, rinse your hair and body thoroughly to remove the shampoo.  4. Use CHG as you would any other liquid soap. You can apply CHG directly to the skin and wash gently with a scrungie or a clean washcloth.  5. Apply the CHG soap to your body only from the neck down. Do not use on open wounds or open sores. Avoid contact with your eyes, ears, mouth, and genitals (private parts). Wash face and genitals (private parts) with your normal soap.  6. Wash thoroughly, paying special attention to the area where your surgery will be performed.  7. Thoroughly rinse your body with warm water.  8. Do not shower/wash with your normal soap after using and rinsing off the CHG soap.  9. Pat yourself dry with a clean towel.  10.  Wear clean pajamas to bed the night before surgery.  12. Place clean sheets on your bed the night of your first shower and do not sleep with pets.  13. Shower again with the CHG soap on the day of surgery prior to arriving at the hospital.  14. Do not apply any deodorants/lotions/powders.  15. Please wear clean clothes to the hospital.

## 2022-09-18 ENCOUNTER — Encounter
Admission: RE | Admit: 2022-09-18 | Discharge: 2022-09-18 | Disposition: A | Discharge status: Home / Self Care | Payer: Medicare Other | Source: Ambulatory Visit | Attending: Obstetrics and Gynecology | Admitting: Obstetrics and Gynecology

## 2022-09-18 DIAGNOSIS — E785 Hyperlipidemia, unspecified: Secondary | ICD-10-CM | POA: Insufficient documentation

## 2022-09-18 DIAGNOSIS — Z01818 Encounter for other preprocedural examination: Secondary | ICD-10-CM | POA: Diagnosis not present

## 2022-09-18 LAB — HEMOGLOBIN AND HEMATOCRIT, BLOOD
HCT: 40.1 % (ref 36.0–46.0)
Hemoglobin: 13.3 g/dL (ref 12.0–15.0)

## 2022-09-18 LAB — TYPE AND SCREEN
ABO/RH(D): A POS
Antibody Screen: NEGATIVE

## 2022-09-19 ENCOUNTER — Encounter: Payer: Self-pay | Admitting: Obstetrics and Gynecology

## 2022-09-22 ENCOUNTER — Encounter: Payer: Self-pay | Admitting: Obstetrics and Gynecology

## 2022-09-24 ENCOUNTER — Other Ambulatory Visit: Payer: Self-pay

## 2022-09-24 ENCOUNTER — Ambulatory Visit: Payer: Medicare Other | Admitting: Anesthesiology

## 2022-09-24 ENCOUNTER — Ambulatory Visit
Admission: RE | Admit: 2022-09-24 | Discharge: 2022-09-24 | Disposition: A | Discharge status: Home / Self Care | Payer: Medicare Other | Attending: Obstetrics and Gynecology | Admitting: Obstetrics and Gynecology

## 2022-09-24 ENCOUNTER — Encounter: Payer: Self-pay | Admitting: Obstetrics and Gynecology

## 2022-09-24 ENCOUNTER — Encounter
Admission: RE | Disposition: A | Discharge status: Home / Self Care | Payer: Self-pay | Source: Home / Self Care | Attending: Obstetrics and Gynecology

## 2022-09-24 DIAGNOSIS — N816 Rectocele: Secondary | ICD-10-CM | POA: Insufficient documentation

## 2022-09-24 DIAGNOSIS — Z90710 Acquired absence of both cervix and uterus: Secondary | ICD-10-CM | POA: Insufficient documentation

## 2022-09-24 DIAGNOSIS — N393 Stress incontinence (female) (male): Secondary | ICD-10-CM | POA: Diagnosis not present

## 2022-09-24 DIAGNOSIS — N8189 Other female genital prolapse: Secondary | ICD-10-CM

## 2022-09-24 HISTORY — PX: BLADDER SUSPENSION: SHX72

## 2022-09-24 HISTORY — PX: ANTERIOR AND POSTERIOR REPAIR: SHX5121

## 2022-09-24 LAB — ABO/RH: ABO/RH(D): A POS

## 2022-09-24 SURGERY — ANTERIOR (CYSTOCELE) AND POSTERIOR REPAIR (RECTOCELE)
Anesthesia: General | Site: Vagina

## 2022-09-24 MED ORDER — ALBUTEROL SULFATE HFA 108 (90 BASE) MCG/ACT IN AERS
INHALATION_SPRAY | RESPIRATORY_TRACT | Status: DC | PRN
  Administered 2022-09-24: 4 via RESPIRATORY_TRACT
  Administered 2022-09-24: 2 via RESPIRATORY_TRACT

## 2022-09-24 MED ORDER — PROPOFOL 10 MG/ML IV BOLUS
INTRAVENOUS | Status: DC | PRN
  Administered 2022-09-24: 200 mg via INTRAVENOUS

## 2022-09-24 MED ORDER — CHLORHEXIDINE GLUCONATE 0.12 % MT SOLN
OROMUCOSAL | Status: AC
  Filled 2022-09-24: qty 15

## 2022-09-24 MED ORDER — VASOPRESSIN 20 UNIT/ML IV SOLN
INTRAVENOUS | Status: AC
  Filled 2022-09-24: qty 1

## 2022-09-24 MED ORDER — PROPOFOL 10 MG/ML IV BOLUS
INTRAVENOUS | Status: AC
  Filled 2022-09-24: qty 20

## 2022-09-24 MED ORDER — SUGAMMADEX SODIUM 200 MG/2ML IV SOLN
INTRAVENOUS | Status: DC | PRN
  Administered 2022-09-24: 200 mg via INTRAVENOUS

## 2022-09-24 MED ORDER — LACTATED RINGERS IV SOLN: INTRAVENOUS | Status: DC

## 2022-09-24 MED ORDER — POVIDONE-IODINE 10 % EX SWAB: 2.0000 | Freq: Once | CUTANEOUS | Status: DC

## 2022-09-24 MED ORDER — FENTANYL CITRATE (PF) 100 MCG/2ML IJ SOLN: 25.0000 ug | INTRAMUSCULAR | Status: DC | PRN

## 2022-09-24 MED ORDER — DEXAMETHASONE SODIUM PHOSPHATE 10 MG/ML IJ SOLN
INTRAMUSCULAR | Status: AC
  Filled 2022-09-24: qty 1

## 2022-09-24 MED ORDER — LIDOCAINE HCL (PF) 2 % IJ SOLN
INTRAMUSCULAR | Status: AC
  Filled 2022-09-24: qty 5

## 2022-09-24 MED ORDER — ONDANSETRON HCL 4 MG/2ML IJ SOLN
INTRAMUSCULAR | Status: AC
  Filled 2022-09-24: qty 2

## 2022-09-24 MED ORDER — SODIUM CHLORIDE (PF) 0.9 % IJ SOLN
INTRAMUSCULAR | Status: AC
  Filled 2022-09-24: qty 50

## 2022-09-24 MED ORDER — MIDAZOLAM HCL 2 MG/2ML IJ SOLN
INTRAMUSCULAR | Status: AC
  Filled 2022-09-24: qty 2

## 2022-09-24 MED ORDER — ROCURONIUM BROMIDE 100 MG/10ML IV SOLN
INTRAVENOUS | Status: DC | PRN
  Administered 2022-09-24: 40 mg via INTRAVENOUS
  Administered 2022-09-24: 10 mg via INTRAVENOUS

## 2022-09-24 MED ORDER — ONDANSETRON HCL 4 MG/2ML IJ SOLN: 4.0000 mg | Freq: Once | INTRAMUSCULAR | Status: DC | PRN

## 2022-09-24 MED ORDER — DEXAMETHASONE SODIUM PHOSPHATE 10 MG/ML IJ SOLN
INTRAMUSCULAR | Status: DC | PRN
  Administered 2022-09-24: 10 mg via INTRAVENOUS

## 2022-09-24 MED ORDER — FENTANYL CITRATE (PF) 100 MCG/2ML IJ SOLN
INTRAMUSCULAR | Status: DC | PRN
  Administered 2022-09-24: 25 ug via INTRAVENOUS
  Administered 2022-09-24 (×2): 50 ug via INTRAVENOUS

## 2022-09-24 MED ORDER — ESTROGENS CONJUGATED 0.625 MG/GM VA CREA
TOPICAL_CREAM | VAGINAL | Status: AC
  Filled 2022-09-24: qty 30

## 2022-09-24 MED ORDER — FENTANYL CITRATE (PF) 100 MCG/2ML IJ SOLN
INTRAMUSCULAR | Status: AC
  Filled 2022-09-24: qty 2

## 2022-09-24 MED ORDER — ORAL CARE MOUTH RINSE: 15.0000 mL | Freq: Once | OROMUCOSAL | Status: AC

## 2022-09-24 MED ORDER — FAMOTIDINE 20 MG PO TABS
ORAL_TABLET | ORAL | Status: AC
  Filled 2022-09-24: qty 1

## 2022-09-24 MED ORDER — FAMOTIDINE 20 MG PO TABS
20.0000 mg | ORAL_TABLET | Freq: Once | ORAL | Status: AC
  Administered 2022-09-24: 20 mg via ORAL

## 2022-09-24 MED ORDER — LIDOCAINE-EPINEPHRINE 1 %-1:100000 IJ SOLN
INTRAMUSCULAR | Status: AC
  Filled 2022-09-24: qty 1

## 2022-09-24 MED ORDER — OXYCODONE-ACETAMINOPHEN 5-325 MG PO TABS: 1.0000 | ORAL_TABLET | ORAL | 0 refills | Status: DC | PRN

## 2022-09-24 MED ORDER — KETOROLAC TROMETHAMINE 30 MG/ML IJ SOLN
INTRAMUSCULAR | Status: DC | PRN
  Administered 2022-09-24: 30 mg via INTRAVENOUS

## 2022-09-24 MED ORDER — MIDAZOLAM HCL 2 MG/2ML IJ SOLN
INTRAMUSCULAR | Status: DC | PRN
  Administered 2022-09-24 (×2): 1 mg via INTRAVENOUS

## 2022-09-24 MED ORDER — ACETAMINOPHEN 10 MG/ML IV SOLN: 1000.0000 mg | Freq: Once | INTRAVENOUS | Status: DC | PRN

## 2022-09-24 MED ORDER — CEFAZOLIN SODIUM-DEXTROSE 2-4 GM/100ML-% IV SOLN
INTRAVENOUS | Status: AC
  Filled 2022-09-24: qty 100

## 2022-09-24 MED ORDER — OXYCODONE HCL 5 MG/5ML PO SOLN: 5.0000 mg | Freq: Once | ORAL | Status: DC | PRN

## 2022-09-24 MED ORDER — VASOPRESSIN 20 UNIT/ML IV SOLN
INTRAVENOUS | Status: DC | PRN
  Administered 2022-09-24 (×2): 5 mL via INTRAMUSCULAR
  Administered 2022-09-24: 7 mL via INTRAMUSCULAR

## 2022-09-24 MED ORDER — ACETAMINOPHEN 10 MG/ML IV SOLN
INTRAVENOUS | Status: AC
  Filled 2022-09-24: qty 100

## 2022-09-24 MED ORDER — LIDOCAINE HCL (CARDIAC) PF 100 MG/5ML IV SOSY
PREFILLED_SYRINGE | INTRAVENOUS | Status: DC | PRN
  Administered 2022-09-24: 50 mg via INTRAVENOUS

## 2022-09-24 MED ORDER — ROCURONIUM BROMIDE 10 MG/ML (PF) SYRINGE
PREFILLED_SYRINGE | INTRAVENOUS | Status: AC
  Filled 2022-09-24: qty 10

## 2022-09-24 MED ORDER — 0.9 % SODIUM CHLORIDE (POUR BTL) OPTIME
TOPICAL | Status: DC | PRN
  Administered 2022-09-24: 500 mL

## 2022-09-24 MED ORDER — ONDANSETRON HCL 4 MG/2ML IJ SOLN
INTRAMUSCULAR | Status: DC | PRN
  Administered 2022-09-24: 4 mg via INTRAVENOUS

## 2022-09-24 MED ORDER — ALBUTEROL SULFATE HFA 108 (90 BASE) MCG/ACT IN AERS
INHALATION_SPRAY | RESPIRATORY_TRACT | Status: AC
  Filled 2022-09-24: qty 6.7

## 2022-09-24 MED ORDER — OXYCODONE HCL 5 MG PO TABS: 5.0000 mg | ORAL_TABLET | Freq: Once | ORAL | Status: DC | PRN

## 2022-09-24 MED ORDER — ACETAMINOPHEN 10 MG/ML IV SOLN
INTRAVENOUS | Status: DC | PRN
  Administered 2022-09-24: 1000 mg via INTRAVENOUS

## 2022-09-24 MED ORDER — DEXMEDETOMIDINE HCL IN NACL 80 MCG/20ML IV SOLN
INTRAVENOUS | Status: DC | PRN
  Administered 2022-09-24: 8 ug via INTRAVENOUS
  Administered 2022-09-24: 12 ug via INTRAVENOUS

## 2022-09-24 MED ORDER — CHLORHEXIDINE GLUCONATE 0.12 % MT SOLN
15.0000 mL | Freq: Once | OROMUCOSAL | Status: AC
  Administered 2022-09-24: 15 mL via OROMUCOSAL

## 2022-09-24 MED ORDER — CEFAZOLIN SODIUM-DEXTROSE 2-4 GM/100ML-% IV SOLN
2.0000 g | INTRAVENOUS | Status: AC
  Administered 2022-09-24: 2 g via INTRAVENOUS

## 2022-09-24 SURGICAL SUPPLY — 69 items
ADH LQ OCL WTPRF AMP STRL LF (MISCELLANEOUS)
ADH SKN CLS APL DERMABOND .7 (GAUZE/BANDAGES/DRESSINGS) ×1
ADHESIVE MASTISOL STRL (MISCELLANEOUS) IMPLANT
BAG DECANTER FOR FLEXI CONT (MISCELLANEOUS) ×1 IMPLANT
BAG DRN 6X6 URO DRBLE ADPR (MISCELLANEOUS) ×1
BAG DRN RND TRDRP ANRFLXCHMBR (UROLOGICAL SUPPLIES) ×1
BAG URINE DRAIN 2000ML AR STRL (UROLOGICAL SUPPLIES) ×1 IMPLANT
BAG URINE DRAIN UROCATCH STRL (MISCELLANEOUS) ×1 IMPLANT
BLADE SURG 15 STRL LF DISP TIS (BLADE) ×1 IMPLANT
BLADE SURG 15 STRL SS (BLADE) ×1
BLADE SURG SZ10 CARB STEEL (BLADE) ×1 IMPLANT
BNDG GAUZE DERMACEA FLUFF 4 (GAUZE/BANDAGES/DRESSINGS) IMPLANT
BNDG GZE DERMACEA 4 6PLY (GAUZE/BANDAGES/DRESSINGS)
CATH FOLEY 2WAY  5CC 16FR (CATHETERS) ×1
CATH FOLEY 2WAY 5CC 16FR (CATHETERS) ×1
CATH FOLEY 2WAY SIL 16X30 (CATHETERS) ×1 IMPLANT
CATH URTH 16FR FL 2W BLN LF (CATHETERS) IMPLANT
CLEANER CAUTERY TIP 5X5 PAD (MISCELLANEOUS) IMPLANT
DERMABOND ADVANCED .7 DNX12 (GAUZE/BANDAGES/DRESSINGS) ×1 IMPLANT
DRAPE LAPAROTOMY 100X77 ABD (DRAPES) ×1 IMPLANT
DRAPE PERI LITHO V/GYN (MISCELLANEOUS) ×1 IMPLANT
DRAPE UNDER BUTTOCK W/FLU (DRAPES) ×1 IMPLANT
DRAPE UTILITY 15X26 TOWEL STRL (DRAPES) ×1 IMPLANT
ELECT CAUTERY BLADE 6.4 (BLADE) ×1 IMPLANT
ELECT REM PT RETURN 9FT ADLT (ELECTROSURGICAL) ×1
ELECTRODE REM PT RTRN 9FT ADLT (ELECTROSURGICAL) ×1 IMPLANT
GAUZE 4X4 16PLY ~~LOC~~+RFID DBL (SPONGE) ×4 IMPLANT
GAUZE PACK 2X3YD (PACKING) ×1 IMPLANT
GAUZE PACKING FOLDED 2  STR (GAUZE/BANDAGES/DRESSINGS) ×1
GAUZE PACKING FOLDED 2 STR (GAUZE/BANDAGES/DRESSINGS) IMPLANT
GLOVE BIO SURGEON STRL SZ 6.5 (GLOVE) ×1 IMPLANT
GLOVE BIO SURGEON STRL SZ8 (GLOVE) ×1 IMPLANT
GLOVE INDICATOR 7.0 STRL GRN (GLOVE) ×1 IMPLANT
GLOVE PI ORTHO PRO STRL 7.5 (GLOVE) ×2 IMPLANT
GOWN STRL REUS W/ TWL LRG LVL3 (GOWN DISPOSABLE) ×2 IMPLANT
GOWN STRL REUS W/ TWL XL LVL3 (GOWN DISPOSABLE) ×1 IMPLANT
GOWN STRL REUS W/TWL LRG LVL3 (GOWN DISPOSABLE) ×2
GOWN STRL REUS W/TWL XL LVL3 (GOWN DISPOSABLE) ×1
IV LACTATED RINGERS 1000ML (IV SOLUTION) IMPLANT
MANIFOLD NEPTUNE II (INSTRUMENTS) ×1 IMPLANT
NDL HYPO 22X1.5 SAFETY MO (MISCELLANEOUS) ×1 IMPLANT
NDL SAFETY ECLIP 18X1.5 (MISCELLANEOUS) ×1 IMPLANT
NDL SPNL 22GX3.5 QUINCKE BK (NEEDLE) ×1 IMPLANT
NEEDLE HYPO 22X1.5 SAFETY MO (MISCELLANEOUS) ×1 IMPLANT
NEEDLE SPNL 22GX3.5 QUINCKE BK (NEEDLE) ×1 IMPLANT
NS IRRIG 500ML POUR BTL (IV SOLUTION) ×1 IMPLANT
PACK BASIN MINOR ARMC (MISCELLANEOUS) ×1 IMPLANT
PAD OB MATERNITY 4.3X12.25 (PERSONAL CARE ITEMS) ×1 IMPLANT
PAD PREP OB/GYN DISP 24X41 (PERSONAL CARE ITEMS) ×1 IMPLANT
RETRACTOR PHONTONGUIDE ADAPT (ADAPTER) IMPLANT
SCRUB CHG 4% DYNA-HEX 4OZ (MISCELLANEOUS) ×1 IMPLANT
SET CYSTO W/LG BORE CLAMP LF (SET/KITS/TRAYS/PACK) IMPLANT
SLING TRANSOBTURATOR OBTRYX (Sling) ×1 IMPLANT
STRAP SAFETY 5IN WIDE (MISCELLANEOUS) ×1 IMPLANT
STRIP CLOSURE SKIN 1/2X4 (GAUZE/BANDAGES/DRESSINGS) ×1 IMPLANT
SURGILUBE 2OZ TUBE FLIPTOP (MISCELLANEOUS) ×1 IMPLANT
SUT VIC AB 0 CT1 27 (SUTURE) ×2
SUT VIC AB 0 CT1 27XCR 8 STRN (SUTURE) ×2 IMPLANT
SUT VIC AB 0 CT1 36 (SUTURE) ×1 IMPLANT
SUT VIC AB 2-0 CT1 (SUTURE) ×2 IMPLANT
SUT VICRYL 0 AB UR-6 (SUTURE) IMPLANT
SUT VICRYL+ 3-0 36IN CT-1 (SUTURE) ×1 IMPLANT
SYR 10ML LL (SYRINGE) ×2 IMPLANT
SYR CONTROL 10ML LL (SYRINGE) ×1 IMPLANT
SYR TOOMEY IRRIG 70ML (MISCELLANEOUS)
SYRINGE TOOMEY IRRIG 70ML (MISCELLANEOUS) ×1 IMPLANT
TOWEL OR 17X26 4PK STRL BLUE (TOWEL DISPOSABLE) ×1 IMPLANT
TRAP FLUID SMOKE EVACUATOR (MISCELLANEOUS) ×1 IMPLANT
WATER STERILE IRR 500ML POUR (IV SOLUTION) ×1 IMPLANT

## 2022-09-24 NOTE — Anesthesia Preprocedure Evaluation (Addendum)
Anesthesia Evaluation  Patient identified by MRN, date of birth, ID band Patient awake    Reviewed: Allergy & Precautions, NPO status , Patient's Chart, lab work & pertinent test results  History of Anesthesia Complications Negative for: history of anesthetic complications  Airway Mallampati: I   Neck ROM: Full    Dental no notable dental hx.    Pulmonary asthma (cough variant) , Recent URI , Residual Cough   Pulmonary exam normal breath sounds clear to auscultation       Cardiovascular Exercise Tolerance: Good negative cardio ROS Normal cardiovascular exam Rhythm:Regular Rate:Normal     Neuro/Psych  PSYCHIATRIC DISORDERS Anxiety Depression    negative neurological ROS     GI/Hepatic ,GERD  ,,  Endo/Other  negative endocrine ROS    Renal/GU negative Renal ROS     Musculoskeletal   Abdominal   Peds  Hematology negative hematology ROS (+)   Anesthesia Other Findings   Reproductive/Obstetrics                             Anesthesia Physical Anesthesia Plan  ASA: 2  Anesthesia Plan: General   Post-op Pain Management:    Induction: Intravenous  PONV Risk Score and Plan: 3 and Ondansetron, Dexamethasone and Treatment may vary due to age or medical condition  Airway Management Planned: Oral ETT  Additional Equipment:   Intra-op Plan:   Post-operative Plan: Extubation in OR  Informed Consent: I have reviewed the patients History and Physical, chart, labs and discussed the procedure including the risks, benefits and alternatives for the proposed anesthesia with the patient or authorized representative who has indicated his/her understanding and acceptance.     Dental advisory given  Plan Discussed with: CRNA  Anesthesia Plan Comments: (Patient consented for risks of anesthesia including but not limited to:  - adverse reactions to medications - damage to eyes, teeth, lips or  other oral mucosa - nerve damage due to positioning  - sore throat or hoarseness - damage to heart, brain, nerves, lungs, other parts of body or loss of life  Informed patient about role of CRNA in peri- and intra-operative care.  Patient voiced understanding.)       Anesthesia Quick Evaluation

## 2022-09-24 NOTE — Anesthesia Procedure Notes (Signed)
Procedure Name: Intubation Date/Time: 09/24/2022 9:54 AM  Performed by: Jeannene Patella, CRNAPre-anesthesia Checklist: Patient identified, Emergency Drugs available, Suction available, Patient being monitored and Timeout performed Patient Re-evaluated:Patient Re-evaluated prior to induction Oxygen Delivery Method: Circle system utilized Preoxygenation: Pre-oxygenation with 100% oxygen Induction Type: IV induction Ventilation: Mask ventilation without difficulty Laryngoscope Size: McGraph and 3 Grade View: Grade I Tube type: Oral Tube size: 6.5 mm Number of attempts: 1 Airway Equipment and Method: Stylet and Bougie stylet Placement Confirmation: ETT inserted through vocal cords under direct vision, positive ETCO2 and breath sounds checked- equal and bilateral Secured at: 20 (at lip) cm Dental Injury: Teeth and Oropharynx as per pre-operative assessment

## 2022-09-24 NOTE — Interval H&P Note (Signed)
History and Physical Interval Note:  09/24/2022 9:42 AM  Desiree Grant  has presented today for surgery, with the diagnosis of Pelvic Relaxation, Stress urinary incontinence.  The various methods of treatment have been discussed with the patient and family. After consideration of risks, benefits and other options for treatment, the patient has consented to  Procedure(s): ANTERIOR (CYSTOCELE) AND POSTERIOR REPAIR (RECTOCELE) (N/A) TOT (N/A) as a surgical intervention.  The patient's history has been reviewed, patient examined, no change in status, stable for surgery.  I have reviewed the patient's chart and labs.  Questions were answered to the patient's satisfaction.     Brennan Bailey

## 2022-09-24 NOTE — Anesthesia Postprocedure Evaluation (Signed)
Anesthesia Post Note  Patient: Desiree Grant  Procedure(s) Performed: ANTERIOR (CYSTOCELE) AND POSTERIOR REPAIR (RECTOCELE) (Vagina ) TOT (Vagina )  Patient location during evaluation: PACU Anesthesia Type: General Level of consciousness: awake and alert, oriented and patient cooperative Pain management: pain level controlled Vital Signs Assessment: post-procedure vital signs reviewed and stable Respiratory status: spontaneous breathing, nonlabored ventilation and respiratory function stable Cardiovascular status: blood pressure returned to baseline and stable Postop Assessment: adequate PO intake Anesthetic complications: no   No notable events documented.   Last Vitals:  Vitals:   09/24/22 1215 09/24/22 1233  BP: 129/63 130/68  Pulse: 90 82  Resp: 17   Temp: 36.9 C (!) 36.1 C  SpO2: 94% 100%    Last Pain:  Vitals:   09/24/22 1233  TempSrc: Temporal  PainSc: 0-No pain                 Reed Breech

## 2022-09-24 NOTE — Discharge Instructions (Signed)
AMBULATORY SURGERY  ?DISCHARGE INSTRUCTIONS ? ? ?The drugs that you were given will stay in your system until tomorrow so for the next 24 hours you should not: ? ?Drive an automobile ?Make any legal decisions ?Drink any alcoholic beverage ? ? ?You may resume regular meals tomorrow.  Today it is better to start with liquids and gradually work up to solid foods. ? ?You may eat anything you prefer, but it is better to start with liquids, then soup and crackers, and gradually work up to solid foods. ? ? ?Please notify your doctor immediately if you have any unusual bleeding, trouble breathing, redness and pain at the surgery site, drainage, fever, or pain not relieved by medication. ? ? ? ?Additional Instructions: ? ? ? ?Please contact your physician with any problems or Same Day Surgery at [phone], Monday through Friday 6 am to 4 pm, or [place] [facility] [place] Main number at [phone].  ?

## 2022-09-24 NOTE — Transfer of Care (Signed)
Immediate Anesthesia Transfer of Care Note  Patient: Desiree Grant  Procedure(s) Performed: ANTERIOR (CYSTOCELE) AND POSTERIOR REPAIR (RECTOCELE) (Vagina ) TOT (Vagina )  Patient Location: PACU  Anesthesia Type:General  Level of Consciousness: drowsy and patient cooperative  Airway & Oxygen Therapy: Patient Spontanous Breathing and Patient connected to face mask oxygen  Post-op Assessment: Report given to RN and Post -op Vital signs reviewed and stable  Post vital signs: Reviewed and stable  Last Vitals:  Vitals Value Taken Time  BP 144/71 09/24/22 1131  Temp    Pulse 87 09/24/22 1136  Resp 14 09/24/22 1136  SpO2 100 % 09/24/22 1136  Vitals shown include unvalidated device data.  Last Pain:  Vitals:   09/24/22 0753  TempSrc: Temporal  PainSc: 0-No pain         Complications: No notable events documented.

## 2022-09-24 NOTE — Op Note (Signed)
       OPERATIVE NOTE 09/24/2022 11:24 AM  PRE-OPERATIVE DIAGNOSIS:  1) Pelvic Relaxation, Stress urinary incontinence  POST-OPERATIVE DIAGNOSIS:  1) Same  OPERATION: Procedure(s) (LRB): ANTERIOR (CYSTOCELE) AND POSTERIOR REPAIR (RECTOCELE) (N/A) TOT (N/A)  SURGEON(S): Surgeon(s) and Role:    Linzie Collin, MD - Primary    * Hildred Laser, MD   No other capable assistant was available for this surgery which requires an experienced, high level assistant.  ANESTHESIA: General  ESTIMATED BLOOD LOSS: 125 mL  SPECIMEN: * No specimens in log *  COMPLICATIONS: None  DRAINS: NONE  DISPOSITION: Stable to recovery room  PROCEDURE: The vaginal mucosa beginning at the midline edge of the cervix and overlying the bladder, was grasped with Allis clamps and injected with a dilute Pitressin solution in the midline. A midline incision was made to the level of the urethra. The vaginal mucosa was dissected laterally from the underlying attenuated fascia. A Foley catheter was placed within the bladder and the bladder was emptied. Clear urine was noted. The obturator foramina were identified in the usual manner bilaterally and marked with a marking pen the skin and subcutaneous tissues were injected with a dilute Pitressin solution. Stab incisions were made and the TOT trochars were placed through these incisions onto the operator's finger in the vagina which was retracted and bladder medially. The vaginal tape was then placed on the trochars and reversed through these incisions. A Kelly clamp was placed under the tape and the sleeves of the tape were removed. The tape was noted to be correctly positioned underneath the urethra without twists. The excess tape was removed at the level of the skin. Steri-Strips were applied over these small skin incisions. A typical Kelly plication was performed carefully covering the tension-free vaginal tape with thickened fascia. The bladder was plicated  several sutures of 3-0 Vicryl.  A shelf of fascia was then approximated in the midline placing the bladder back in its more anatomic position.The excess vaginal mucosa was trimmed. Vaginal mucosa was then closed in the midline with interrupted sutures to the level of the cervix. The posterior fourchette at approximately the hymenal ring was grasped using Allis clamps. The posterior vaginal mucosa was injected in the midline with a dilute Pitressin solution. A midline incision was made through the vaginal mucosa to the level of the post cervix and the vaginal mucosa was dissected laterally exposing the underlying attenuated fascia. Beginning at the posterior cervix, the attenuated fascia was grasped laterally and approximated in the midline thickening and tightening the fascia. These sutures were carried down to the level of the perineum. The excess vaginal mucosa was trimmed. The vagina was closed with interrupted sutures beginning at the vaginal cuff and carried down toward the perineal body. The perineal body was reinforced with multiple sutures of Vicryl. The mucosa was then closed over the perineal body in a subcuticular manner. Hemostasis was noted.  Clear urine was noted in the Foley.  The Foley was then removed. Dr. Valentino Saxon provided exposure, dissection, suctioning, retraction, and general support and assistance during the procedure.    Elonda Husky, M.D. 09/24/2022 11:24 AM

## 2022-09-25 ENCOUNTER — Encounter: Payer: Self-pay | Admitting: Obstetrics and Gynecology

## 2022-09-27 ENCOUNTER — Encounter: Payer: 59 | Admitting: Internal Medicine

## 2022-10-02 ENCOUNTER — Encounter: Payer: Self-pay | Admitting: Obstetrics and Gynecology

## 2022-10-02 ENCOUNTER — Ambulatory Visit (INDEPENDENT_AMBULATORY_CARE_PROVIDER_SITE_OTHER): Payer: Medicare Other | Admitting: Obstetrics and Gynecology

## 2022-10-02 VITALS — BP 137/82 | HR 81 | Ht 64.0 in | Wt 148.1 lb

## 2022-10-02 DIAGNOSIS — Z4889 Encounter for other specified surgical aftercare: Secondary | ICD-10-CM

## 2022-10-02 DIAGNOSIS — Z9889 Other specified postprocedural states: Secondary | ICD-10-CM

## 2022-10-02 NOTE — Progress Notes (Signed)
Patient presents for 1 week postop follow-up following A/P repair with sling. She states doing well other than having some back pain. She is continuing to take stool softeners.

## 2022-10-02 NOTE — Progress Notes (Signed)
HPI:      [name] is a 67 y.o. G3P2 who LMP was No LMP recorded. Patient has had a hysterectomy.  Subjective:   She presents today 1 week post from AMP repair TOT.  She reports she is doing well.  Has minimal vaginal bleeding.  Says she is not often having pain.  She is urinating without issue.  She has been taking Dulcolax to keep her stool soft and not straining but she has no issues with bowel movements.  Reports that she is generally doing well.    Hx: The following portions of the patient's history were reviewed and updated as appropriate:             She  has a past medical history of Anxiety, Bowel trouble (2010), Depression, major, recurrent, moderate (HCC) (10/26/2014), Hyperlipidemia, mild (02/06/2015), Motion sickness, Shoulder strain, right, initial encounter (09/13/2016), and Special screening for malignant neoplasms, colon (2012). She does not have any pertinent problems on file. She  has a past surgical history that includes Colonoscopy w/ polypectomy (2012); Partial hysterectomy; Facial cosmetic surgery (2015); Cataract extraction w/PHACO (Right, 09/02/2017); Cataract extraction w/PHACO (Left, 10/30/2017); Anterior and posterior repair (N/A, 09/24/2022); and Bladder suspension (N/A, 09/24/2022). Her family history includes COPD in her mother. She  reports that she has never smoked. She has never used smokeless tobacco. She reports that she does not drink alcohol and does not use drugs. She has a current medication list which includes the following prescription(s): aripiprazole, bisacodyl, bupropion er, and venlafaxine xr. She is allergic to niacin and related.       Review of Systems:  Review of Systems  Constitutional: Denied constitutional symptoms, night sweats, recent illness, fatigue, fever, insomnia and weight loss.  Eyes: Denied eye symptoms, eye pain, photophobia, vision change and visual disturbance.  Ears/Nose/Throat/Neck: Denied ear, nose, throat or neck  symptoms, hearing loss, nasal discharge, sinus congestion and sore throat.  Cardiovascular: Denied cardiovascular symptoms, arrhythmia, chest pain/pressure, edema, exercise intolerance, orthopnea and palpitations.  Respiratory: Denied pulmonary symptoms, asthma, pleuritic pain, productive sputum, cough, dyspnea and wheezing.  Gastrointestinal: Denied, gastro-esophageal reflux, melena, nausea and vomiting.  Genitourinary: Denied genitourinary symptoms including symptomatic vaginal discharge, pelvic relaxation issues, and urinary complaints.  Musculoskeletal: Denied musculoskeletal symptoms, stiffness, swelling, muscle weakness and myalgia.  Dermatologic: Denied dermatology symptoms, rash and scar.  Neurologic: Denied neurology symptoms, dizziness, headache, neck pain and syncope.  Psychiatric: Denied psychiatric symptoms, anxiety and depression.  Endocrine: Denied endocrine symptoms including hot flashes and night sweats.   Meds:   Current Outpatient Medications on File Prior to Visit  Medication Sig Dispense Refill   ARIPiprazole (ABILIFY) 2 MG tablet Take 1 tablet (2 mg total) by mouth daily. 90 tablet 0   bisacodyl (DULCOLAX) 5 MG EC tablet Take 5 mg by mouth every 3 (three) days.     buPROPion ER (WELLBUTRIN SR) 100 MG 12 hr tablet Take 1 tablet (100 mg total) by mouth every morning. 90 tablet 0   venlafaxine XR (EFFEXOR-XR) 150 MG 24 hr capsule Take 1 capsule (150 mg total) by mouth every morning. 90 capsule 0   No current facility-administered medications on file prior to visit.      Objective:     Vitals:   10/02/22 0941  BP: 137/82  Pulse: 81   Filed Weights   10/02/22 0941  Weight: 148 lb 1.6 oz (67.2 kg)  Assessment:    G3P2 Patient Active Problem List   Diagnosis Date Noted   Cervical adenopathy 03/21/2021   Chronic rhinitis 02/24/2021   Cough variant asthma with component of UACS  01/13/2021   Lipoma of right lower extremity 03/08/2020    Lumbar degenerative disc disease 11/02/2019   Gastroesophageal reflux disease without esophagitis 09/10/2018   Neoplasm of uncertain behavior of skin 09/13/2016   Muscle cramp, nocturnal 09/13/2016   Adenomatous colon polyp 02/07/2015   Hyperlipidemia, mild 02/06/2015   Fibrocystic breast changes 10/26/2014   Depression, major, recurrent, moderate (HCC) 10/26/2014     1. Postoperative state     Patient with excellent recovery so far postop.   Plan:            1.  May slowly resume normal activities with the exception of heavy lifting.  No intercourse or anything in the vagina.  Surgery discussed-questions answered Orders No orders of the defined types were placed in this encounter.   No orders of the defined types were placed in this encounter.     F/U  Return in about 5 weeks (around 11/06/2022).  Elonda Husky, M.D. 10/02/2022 9:59 AM

## 2022-10-16 ENCOUNTER — Encounter: Payer: Self-pay | Admitting: Obstetrics and Gynecology

## 2022-10-17 ENCOUNTER — Encounter: Payer: Self-pay | Admitting: Obstetrics and Gynecology

## 2022-11-01 ENCOUNTER — Ambulatory Visit (INDEPENDENT_AMBULATORY_CARE_PROVIDER_SITE_OTHER): Payer: Medicare Other | Admitting: Obstetrics and Gynecology

## 2022-11-01 ENCOUNTER — Encounter: Payer: Self-pay | Admitting: Obstetrics and Gynecology

## 2022-11-01 VITALS — BP 117/80 | HR 80 | Ht 64.0 in | Wt 149.8 lb

## 2022-11-01 DIAGNOSIS — Z4889 Encounter for other specified surgical aftercare: Secondary | ICD-10-CM

## 2022-11-01 DIAGNOSIS — Z9889 Other specified postprocedural states: Secondary | ICD-10-CM

## 2022-11-01 DIAGNOSIS — K649 Unspecified hemorrhoids: Secondary | ICD-10-CM

## 2022-11-01 MED ORDER — HYDROCORTISONE ACETATE 25 MG RE SUPP
25.0000 mg | Freq: Two times a day (BID) | RECTAL | 1 refills | Status: DC
Start: 2022-11-01 — End: 2023-05-22

## 2022-11-01 NOTE — Progress Notes (Signed)
HPI:      [name] is a 67 y.o. G3P2 who LMP was No LMP recorded. Patient has had a hysterectomy.  Subjective:   She presents today for a postop check after A&P repair with TOT.  She reports that she is generally doing well.  Reports that her surgery went "better than expected".  She is having some back pain occasionally and she still is using stool softeners.  She is having bowel movements approximately every other day.  She reports no urine leakage.  She reports no vaginal bleeding.  She still has a small amount of yellow vaginal discharge.  She reports no vaginal or vulvar irritation.    Hx: The following portions of the patient's history were reviewed and updated as appropriate:             She  has a past medical history of Anxiety, Bowel trouble (2010), Depression, major, recurrent, moderate (HCC) (10/26/2014), Hyperlipidemia, mild (02/06/2015), Motion sickness, Shoulder strain, right, initial encounter (09/13/2016), and Special screening for malignant neoplasms, colon (2012). She does not have any pertinent problems on file. She  has a past surgical history that includes Colonoscopy w/ polypectomy (2012); Partial hysterectomy; Facial cosmetic surgery (2015); Cataract extraction w/PHACO (Right, 09/02/2017); Cataract extraction w/PHACO (Left, 10/30/2017); Anterior and posterior repair (N/A, 09/24/2022); and Bladder suspension (N/A, 09/24/2022). Her family history includes COPD in her mother. She  reports that she has never smoked. She has never used smokeless tobacco. She reports that she does not drink alcohol and does not use drugs. She has a current medication list which includes the following prescription(s): aripiprazole, bisacodyl, bupropion er, hydrocortisone, and venlafaxine xr. She is allergic to niacin and related.       Review of Systems:  Review of Systems  Constitutional: Denied constitutional symptoms, night sweats, recent illness, fatigue, fever, insomnia and weight  loss.  Eyes: Denied eye symptoms, eye pain, photophobia, vision change and visual disturbance.  Ears/Nose/Throat/Neck: Denied ear, nose, throat or neck symptoms, hearing loss, nasal discharge, sinus congestion and sore throat.  Cardiovascular: Denied cardiovascular symptoms, arrhythmia, chest pain/pressure, edema, exercise intolerance, orthopnea and palpitations.  Respiratory: Denied pulmonary symptoms, asthma, pleuritic pain, productive sputum, cough, dyspnea and wheezing.  Gastrointestinal: Denied, gastro-esophageal reflux, melena, nausea and vomiting.  Genitourinary: Denied genitourinary symptoms including symptomatic vaginal discharge, pelvic relaxation issues, and urinary complaints.  Musculoskeletal: Denied musculoskeletal symptoms, stiffness, swelling, muscle weakness and myalgia.  Dermatologic: Denied dermatology symptoms, rash and scar.  Neurologic: Denied neurology symptoms, dizziness, headache, neck pain and syncope.  Psychiatric: Denied psychiatric symptoms, anxiety and depression.  Endocrine: Denied endocrine symptoms including hot flashes and night sweats.   Meds:   Current Outpatient Medications on File Prior to Visit  Medication Sig Dispense Refill   ARIPiprazole (ABILIFY) 2 MG tablet Take 1 tablet (2 mg total) by mouth daily. 90 tablet 0   bisacodyl (DULCOLAX) 5 MG EC tablet Take 5 mg by mouth every 3 (three) days.     buPROPion ER (WELLBUTRIN SR) 100 MG 12 hr tablet Take 1 tablet (100 mg total) by mouth every morning. 90 tablet 0   venlafaxine XR (EFFEXOR-XR) 150 MG 24 hr capsule Take 1 capsule (150 mg total) by mouth every morning. 90 capsule 0   No current facility-administered medications on file prior to visit.      Objective:     Vitals:   11/01/22 0953  BP: 117/80  Pulse: 80   Filed Weights   11/01/22 0953  Weight: 149 lb  12.8 oz (67.9 kg)              Physical examination   Pelvic:   Vulva: Normal appearance.  No lesions.  Vagina: No lesions or  abnormalities noted.  Sutures still intact  Support: Normal pelvic support.  Urethra No masses tenderness or scarring.  Meatus Normal size without lesions or prolapse.  Cervix: Surgically absent  Anus: Normal exam.  No lesions.  Perineum: Normal exam.  No lesions.             Assessment:    G3P2 Patient Active Problem List   Diagnosis Date Noted   Cervical adenopathy 03/21/2021   Chronic rhinitis 02/24/2021   Cough variant asthma with component of UACS  01/13/2021   Lipoma of right lower extremity 03/08/2020   Lumbar degenerative disc disease 11/02/2019   Gastroesophageal reflux disease without esophagitis 09/10/2018   Neoplasm of uncertain behavior of skin 09/13/2016   Muscle cramp, nocturnal 09/13/2016   Adenomatous colon polyp 02/07/2015   Hyperlipidemia, mild 02/06/2015   Fibrocystic breast changes 10/26/2014   Depression, major, recurrent, moderate (HCC) 10/26/2014     1. Postoperative state   2. Hemorrhoids, unspecified hemorrhoid type     Patient generally having a good recovery.   Plan:            1.  Advised use of Citrucel to keep stool soft.  2.  Plan follow-up in 3 weeks for further incision check   3.  Anusol HC suppositories for hemorrhoids. Orders No orders of the defined types were placed in this encounter.    Meds ordered this encounter  Medications   hydrocortisone (ANUSOL-HC) 25 MG suppository    Sig: Place 1 suppository (25 mg total) rectally 2 (two) times daily.    Dispense:  12 suppository    Refill:  1      F/U  Return in about 3 weeks (around 11/22/2022).  Elonda Husky, M.D. 11/01/2022 10:19 AM

## 2022-11-01 NOTE — Progress Notes (Signed)
Patient presents for 5.5 week postop follow-up following A/P Repair. She states still having issues with bowel movements, continuing to take laxatives daily. Hemorrhoid suppositories sent in to pharmacy today.

## 2022-11-26 ENCOUNTER — Other Ambulatory Visit: Payer: Self-pay | Admitting: Internal Medicine

## 2022-11-26 DIAGNOSIS — F331 Major depressive disorder, recurrent, moderate: Secondary | ICD-10-CM

## 2022-11-27 ENCOUNTER — Encounter: Payer: Self-pay | Admitting: Obstetrics and Gynecology

## 2022-11-27 ENCOUNTER — Ambulatory Visit: Payer: Medicare Other | Admitting: Obstetrics and Gynecology

## 2022-11-27 VITALS — BP 129/79 | HR 80 | Ht 64.0 in | Wt 153.1 lb

## 2022-11-27 DIAGNOSIS — Z4889 Encounter for other specified surgical aftercare: Secondary | ICD-10-CM

## 2022-11-27 DIAGNOSIS — Z9889 Other specified postprocedural states: Secondary | ICD-10-CM

## 2022-11-27 DIAGNOSIS — K5909 Other constipation: Secondary | ICD-10-CM

## 2022-11-27 DIAGNOSIS — K623 Rectal prolapse: Secondary | ICD-10-CM

## 2022-11-27 NOTE — Progress Notes (Signed)
HPI:      [name] is a 67 y.o. G3P2 who LMP was No LMP recorded. Patient has had a hysterectomy.  Subjective:   She presents today stating that she is doing well with her vagina and is not having any problems with urine leakage.  She is however continued to have problems with bowel movements.  Despite use of Citrucel and Dulcolax she cannot keep her stool soft and is frequently constipated.  She reports that after straining significantly she had a rectal prolapse for short time.  Also complains of hemorrhoids.    Hx: The following portions of the patient's history were reviewed and updated as appropriate:             She  has a past medical history of Anxiety, Bowel trouble (2010), Depression, major, recurrent, moderate (HCC) (10/26/2014), Hyperlipidemia, mild (02/06/2015), Motion sickness, Shoulder strain, right, initial encounter (09/13/2016), and Special screening for malignant neoplasms, colon (2012). She does not have any pertinent problems on file. She  has a past surgical history that includes Colonoscopy w/ polypectomy (2012); Partial hysterectomy; Facial cosmetic surgery (2015); Cataract extraction w/PHACO (Right, 09/02/2017); Cataract extraction w/PHACO (Left, 10/30/2017); Anterior and posterior repair (N/A, 09/24/2022); and Bladder suspension (N/A, 09/24/2022). Her family history includes COPD in her mother. She  reports that she has never smoked. She has never used smokeless tobacco. She reports that she does not drink alcohol and does not use drugs. She has a current medication list which includes the following prescription(s): aripiprazole, bisacodyl, bupropion er, hydrocortisone, and venlafaxine xr. She is allergic to niacin and related.       Review of Systems:  Review of Systems  Constitutional: Denied constitutional symptoms, night sweats, recent illness, fatigue, fever, insomnia and weight loss.  Eyes: Denied eye symptoms, eye pain, photophobia, vision change and visual  disturbance.  Ears/Nose/Throat/Neck: Denied ear, nose, throat or neck symptoms, hearing loss, nasal discharge, sinus congestion and sore throat.  Cardiovascular: Denied cardiovascular symptoms, arrhythmia, chest pain/pressure, edema, exercise intolerance, orthopnea and palpitations.  Respiratory: Denied pulmonary symptoms, asthma, pleuritic pain, productive sputum, cough, dyspnea and wheezing.  Gastrointestinal: Chronic constipation  Genitourinary: See HPI for additional information.  Musculoskeletal: Denied musculoskeletal symptoms, stiffness, swelling, muscle weakness and myalgia.  Dermatologic: Denied dermatology symptoms, rash and scar.  Neurologic: Denied neurology symptoms, dizziness, headache, neck pain and syncope.  Psychiatric: Denied psychiatric symptoms, anxiety and depression.  Endocrine: Denied endocrine symptoms including hot flashes and night sweats.   Meds:   Current Outpatient Medications on File Prior to Visit  Medication Sig Dispense Refill   ARIPiprazole (ABILIFY) 2 MG tablet Take 1 tablet (2 mg total) by mouth daily. 90 tablet 0   bisacodyl (DULCOLAX) 5 MG EC tablet Take 5 mg by mouth every 3 (three) days.     buPROPion ER (WELLBUTRIN SR) 100 MG 12 hr tablet Take 1 tablet (100 mg total) by mouth every morning. 90 tablet 0   hydrocortisone (ANUSOL-HC) 25 MG suppository Place 1 suppository (25 mg total) rectally 2 (two) times daily. 12 suppository 1   venlafaxine XR (EFFEXOR-XR) 150 MG 24 hr capsule Take 1 capsule (150 mg total) by mouth every morning. 90 capsule 0   No current facility-administered medications on file prior to visit.      Objective:     Vitals:   11/27/22 1359  BP: 129/79  Pulse: 80   Filed Weights   11/27/22 1359  Weight: 153 lb 1.6 oz (69.4 kg)  Assessment:    G3P2 Patient Active Problem List   Diagnosis Date Noted   Cervical adenopathy 03/21/2021   Chronic rhinitis 02/24/2021   Cough variant asthma with  component of UACS  01/13/2021   Lipoma of right lower extremity 03/08/2020   Lumbar degenerative disc disease 11/02/2019   Gastroesophageal reflux disease without esophagitis 09/10/2018   Neoplasm of uncertain behavior of skin 09/13/2016   Muscle cramp, nocturnal 09/13/2016   Adenomatous colon polyp 02/07/2015   Hyperlipidemia, mild 02/06/2015   Fibrocystic breast changes 10/26/2014   Depression, major, recurrent, moderate (HCC) 10/26/2014     1. Postoperative state   2. Chronic constipation   3. Rectal prolapse     Patient seems to be doing well from the surgery however her posterior repair has not helped her with bowel movements.  She remains chronically constipated and has now recently had an episode of rectal prolapse.   Plan:            1.  We have discussed continued use of fiber laxatives, increase in water daily.  Referral to GI for further evaluation.  Specifically a plan to keep her stool softer on a daily basis. Orders No orders of the defined types were placed in this encounter.   No orders of the defined types were placed in this encounter.     F/U  No follow-ups on file. I spent 21 minutes involved in the care of this patient preparing to see the patient by obtaining and reviewing her medical history (including labs, imaging tests and prior procedures), documenting clinical information in the electronic health record (EHR), counseling and coordinating care plans, writing and sending prescriptions, ordering tests or procedures and in direct communicating with the patient and medical staff discussing pertinent items from her history and physical exam.  Elonda Husky, M.D. 11/27/2022 2:36 PM

## 2022-11-27 NOTE — Progress Notes (Signed)
Patient presents for 2 month postop follow-up following A/P Repair with TOT. She states continuing to have problems bowel movements, she is taking daily stool softeners and magnesium. Reports still using suppositories from hemorrhoids.

## 2022-12-11 ENCOUNTER — Ambulatory Visit: Payer: Medicare Other | Admitting: Internal Medicine

## 2022-12-17 ENCOUNTER — Telehealth: Payer: Self-pay | Admitting: Internal Medicine

## 2022-12-17 NOTE — Telephone Encounter (Signed)
Tried calling pt. She asked that I call her back tomorrow.  - Desiree Grant

## 2022-12-17 NOTE — Telephone Encounter (Signed)
Copied from CRM [id number]. Topic: Appointment Scheduling - Scheduling Inquiry for Clinic >> Dec 17, 2022  8:21 AM Phill Myron wrote: Reason for CRM:  Patient is currently living out of town temporarily, she has rescheduled her physical, to the first available which is January 2025.  Patient said she would like a call back discussing her situation and when necessary, when it comes to her medication can she do a virtual appointment. Please advise

## 2022-12-18 NOTE — Telephone Encounter (Signed)
Patient scheduled for tomorrow at 11:20 AM.  Terri Skains

## 2022-12-19 ENCOUNTER — Telehealth (INDEPENDENT_AMBULATORY_CARE_PROVIDER_SITE_OTHER): Payer: Medicare Other | Admitting: Internal Medicine

## 2022-12-19 ENCOUNTER — Encounter: Payer: Self-pay | Admitting: Internal Medicine

## 2022-12-19 VITALS — Ht 64.0 in | Wt 150.0 lb

## 2022-12-19 DIAGNOSIS — R635 Abnormal weight gain: Secondary | ICD-10-CM | POA: Diagnosis not present

## 2022-12-19 DIAGNOSIS — F331 Major depressive disorder, recurrent, moderate: Secondary | ICD-10-CM | POA: Diagnosis not present

## 2022-12-19 MED ORDER — VENLAFAXINE HCL ER 150 MG PO CP24: 150.0000 mg | ORAL_CAPSULE | Freq: Every morning | ORAL | 1 refills | Status: DC

## 2022-12-19 MED ORDER — ARIPIPRAZOLE 2 MG PO TABS: 2.0000 mg | ORAL_TABLET | Freq: Every day | ORAL | 1 refills | Status: DC

## 2022-12-19 MED ORDER — BUPROPION HCL ER (SR) 100 MG PO TB12: 100.0000 mg | ORAL_TABLET | Freq: Every morning | ORAL | 1 refills | Status: DC

## 2022-12-19 NOTE — Assessment & Plan Note (Signed)
No longer seeing Psych Doing well on current regimen of Effexor, bupropion and Abilify.

## 2022-12-19 NOTE — Progress Notes (Signed)
Date:  12/19/2022   Name:  Desiree Grant   DOB:  08/08/1955   MRN:  [id number]  This encounter was conducted via video encounter. This platform was deemed appropriate for the issues to be addressed.  The patient was correctly identified.  I advised that I am conducting the visit from a secure room in my office at [facility] clinic.  The patient is located at [employer]. The limitations of this form of encounter were discussed with the patient and he/she agreed to proceed.  Some vital signs will be absent.  Started via video but then lost audio.  Chief Complaint: Depression and Anxiety  Depression        This is a chronic problem.The problem is unchanged.  Associated symptoms include no fatigue.     The symptoms are aggravated by family issues and social issues.  Past treatments include SNRIs - Serotonin and norepinephrine reuptake inhibitors and other medications.   Lab Results  Component Value Date   NA 143 09/25/2021   K 4.3 09/25/2021   CO2 24 09/25/2021   GLUCOSE 99 09/25/2021   BUN 13 09/25/2021   CREATININE 0.81 09/25/2021   CALCIUM 9.4 09/25/2021   EGFR 81 09/25/2021   GFRNONAA 71 09/16/2019   Lab Results  Component Value Date   CHOL 226 (H) 09/25/2021   HDL 69 09/25/2021   LDLCALC 146 (H) 09/25/2021   TRIG 63 09/25/2021   CHOLHDL 3.3 09/25/2021   Lab Results  Component Value Date   TSH 2.420 09/25/2021   Lab Results  Component Value Date   HGBA1C 5.2 09/25/2021   Lab Results  Component Value Date   WBC 3.4 09/25/2021   HGB 13.3 09/18/2022   HCT 40.1 09/18/2022   MCV 84 09/25/2021   PLT 203 09/25/2021   Lab Results  Component Value Date   ALT 15 09/25/2021   AST 18 09/25/2021   ALKPHOS 78 09/25/2021   BILITOT 0.6 09/25/2021   No results found for: "25OHVITD2", "25OHVITD3", "VD25OH"   Review of Systems  Constitutional:  Positive for unexpected weight change (gained back the weight she lost with Wegovy (15 lbs)). Negative for chills, fatigue  and fever.  Respiratory:  Negative for chest tightness and shortness of breath.   Cardiovascular:  Negative for chest pain.  Gastrointestinal:  Negative for constipation and rectal pain.  Genitourinary:  Negative for pelvic pain (recoved from surgery for A&P repair and bladder tack).  Musculoskeletal:  Negative for arthralgias.  Psychiatric/Behavioral:  Positive for depression. Negative for dysphoric mood and sleep disturbance. The patient is nervous/anxious.     Patient Active Problem List   Diagnosis Date Noted   Cervical adenopathy 03/21/2021   Chronic rhinitis 02/24/2021   Cough variant asthma with component of UACS  01/13/2021   Lipoma of right lower extremity 03/08/2020   Lumbar degenerative disc disease 11/02/2019   Gastroesophageal reflux disease without esophagitis 09/10/2018   Neoplasm of uncertain behavior of skin 09/13/2016   Muscle cramp, nocturnal 09/13/2016   Adenomatous colon polyp 02/07/2015   Hyperlipidemia, mild 02/06/2015   Fibrocystic breast changes 10/26/2014   Depression, major, recurrent, moderate (HCC) 10/26/2014    Allergies  Allergen Reactions   Niacin And Related Hives    blisters    Past Surgical History:  Procedure Laterality Date   ANTERIOR AND POSTERIOR REPAIR N/A 09/24/2022   Procedure: ANTERIOR (CYSTOCELE) AND POSTERIOR REPAIR (RECTOCELE);  Surgeon: Linzie Collin, MD;  Location: [facility];  Service: Gynecology;  Laterality: N/A;   BLADDER SUSPENSION N/A 09/24/2022   Procedure: TOT;  Surgeon: Linzie Collin, MD;  Location: [facility];  Service: Gynecology;  Laterality: N/A;   CATARACT EXTRACTION W/PHACO Right 09/02/2017   Procedure: CATARACT EXTRACTION PHACO AND INTRAOCULAR LENS PLACEMENT (IOC) RIGHT SYMFONY TORIC;  Surgeon: Lockie Mola, MD;  Location: [facility];  Service: Ophthalmology;  Laterality: Right;   CATARACT EXTRACTION W/PHACO Left 10/30/2017   Procedure: CATARACT EXTRACTION PHACO AND INTRAOCULAR LENS PLACEMENT  (IOC)  LEFT SYMFONY LENS;  Surgeon: Lockie Mola, MD;  Location: [facility];  Service: Ophthalmology;  Laterality: Left;   COLONOSCOPY W/ POLYPECTOMY  2012   tubular adenoma 0.5cm was removed from descending colon   FACIAL COSMETIC SURGERY  2015   PARTIAL HYSTERECTOMY     bleeding; cervix and ovary remains    Social History   Tobacco Use   Smoking status: Never   Smokeless tobacco: Never  Vaping Use   Vaping status: Never Used  Substance Use Topics   Alcohol use: No    Alcohol/week: 0.0 standard drinks of alcohol   Drug use: No     Medication list has been reviewed and updated.  Current Meds  Medication Sig   docusate sodium (COLACE) 50 MG capsule Take 50 mg by mouth 2 (two) times daily.   hydrocortisone (ANUSOL-HC) 25 MG suppository Place 1 suppository (25 mg total) rectally 2 (two) times daily.   [DISCONTINUED] ARIPiprazole (ABILIFY) 2 MG tablet Take 1 tablet (2 mg total) by mouth daily.   [DISCONTINUED] bisacodyl (DULCOLAX) 5 MG EC tablet Take 5 mg by mouth every 3 (three) days.   [DISCONTINUED] buPROPion ER (WELLBUTRIN SR) 100 MG 12 hr tablet Take 1 tablet (100 mg total) by mouth every morning.   [DISCONTINUED] venlafaxine XR (EFFEXOR-XR) 150 MG 24 hr capsule Take 1 capsule (150 mg total) by mouth every morning.       12/19/2022   11:24 AM 07/03/2022    1:40 PM 09/25/2021   10:51 AM 08/23/2021    3:16 PM  GAD 7 : Generalized Anxiety Score  Nervous, Anxious, on Edge 1 1 0 3  Control/stop worrying 3 1 2 3   Worry too much - different things 3 1 2 3   Trouble relaxing 0 0 0 1  Restless 0 0 1 1  Easily annoyed or irritable 1 1 1  0  Afraid - awful might happen 3 3 3    Total GAD 7 Score 11 7 9    Anxiety Difficulty Somewhat difficult Somewhat difficult Somewhat difficult Somewhat difficult       12/19/2022   11:24 AM 08/15/2022    9:50 AM 07/03/2022    1:40 PM  Depression screen PHQ 2/9  Decreased Interest 0 1 2  Down, Depressed, Hopeless 0 0 0  PHQ -  2 Score 0 1 2  Altered sleeping 0 0 0  Tired, decreased energy 0 1 2  Change in appetite 0 0 1  Feeling bad or failure about yourself  0 0 2  Trouble concentrating 0 0 2  Moving slowly or fidgety/restless 0 0 0  Suicidal thoughts 0 0 0  PHQ-9 Score 0 2 9  Difficult doing work/chores Not difficult at all Not difficult at all Not difficult at all    BP Readings from Last 3 Encounters:  11/27/22 129/79  11/01/22 117/80  10/02/22 137/82    Physical Exam Constitutional:      Appearance: Normal appearance.  Pulmonary:     Effort: Pulmonary effort  is normal.  Neurological:     General: No focal deficit present.     Mental Status: She is alert.  Psychiatric:        Mood and Affect: Mood normal.        Behavior: Behavior normal.     Wt Readings from Last 3 Encounters:  12/19/22 150 lb (68 kg)  11/27/22 153 lb 1.6 oz (69.4 kg)  11/01/22 149 lb 12.8 oz (67.9 kg)    Ht 5\' 4"  (1.626 m)   Wt 150 lb (68 kg)   BMI 25.75 kg/m   Assessment and Plan:  Problem List Items Addressed This Visit       Unprioritized   Depression, major, recurrent, moderate (HCC) - Primary (Chronic)    No longer seeing Psych Doing well on current regimen of Effexor, bupropion and Abilify.      Relevant Medications   venlafaxine XR (EFFEXOR-XR) 150 MG 24 hr capsule   buPROPion ER (WELLBUTRIN SR) 100 MG 12 hr tablet   ARIPiprazole (ABILIFY) 2 MG tablet   Other Visit Diagnoses     Weight gain       was on Wegovy from weight loss clinic and lost 15 lbs now off of meds and has gained it back No meds recommended - just healthy diet and exercise      This visit was conducted via video until audio was lost - then converted to telephone, lasted 12 minutes.  No follow-ups on file.    Reubin Milan, MD [facility] [place]

## 2023-01-04 ENCOUNTER — Encounter: Payer: Medicare Other | Admitting: Internal Medicine

## 2023-01-08 ENCOUNTER — Other Ambulatory Visit: Payer: Self-pay | Admitting: Internal Medicine

## 2023-01-08 DIAGNOSIS — F331 Major depressive disorder, recurrent, moderate: Secondary | ICD-10-CM

## 2023-01-20 ENCOUNTER — Encounter: Payer: Self-pay | Admitting: Internal Medicine

## 2023-01-30 IMAGING — CR DG CHEST 2V
2 series · 2 of 2 positions shown · non-contrast
Comparison: 12/16/2018

CLINICAL DATA: Chronic cough

EXAM:
CHEST - 2 VIEW

[chest pa]
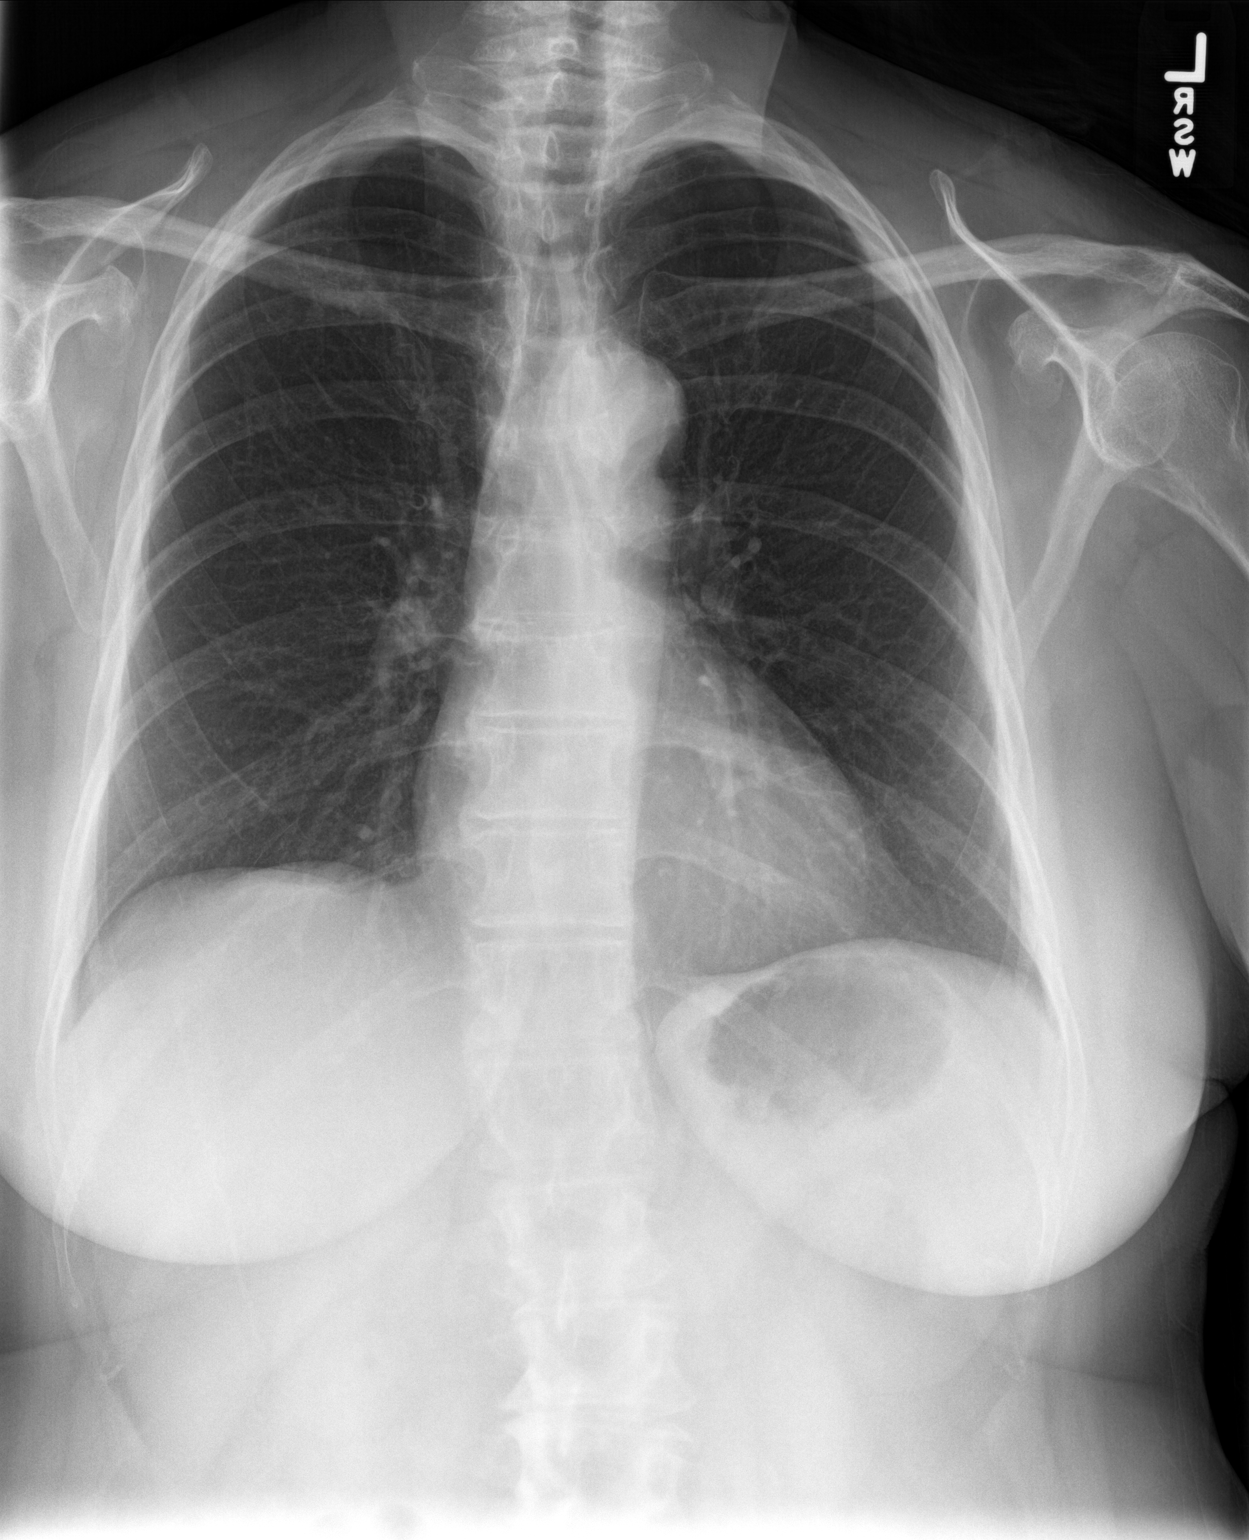

[chest lat]
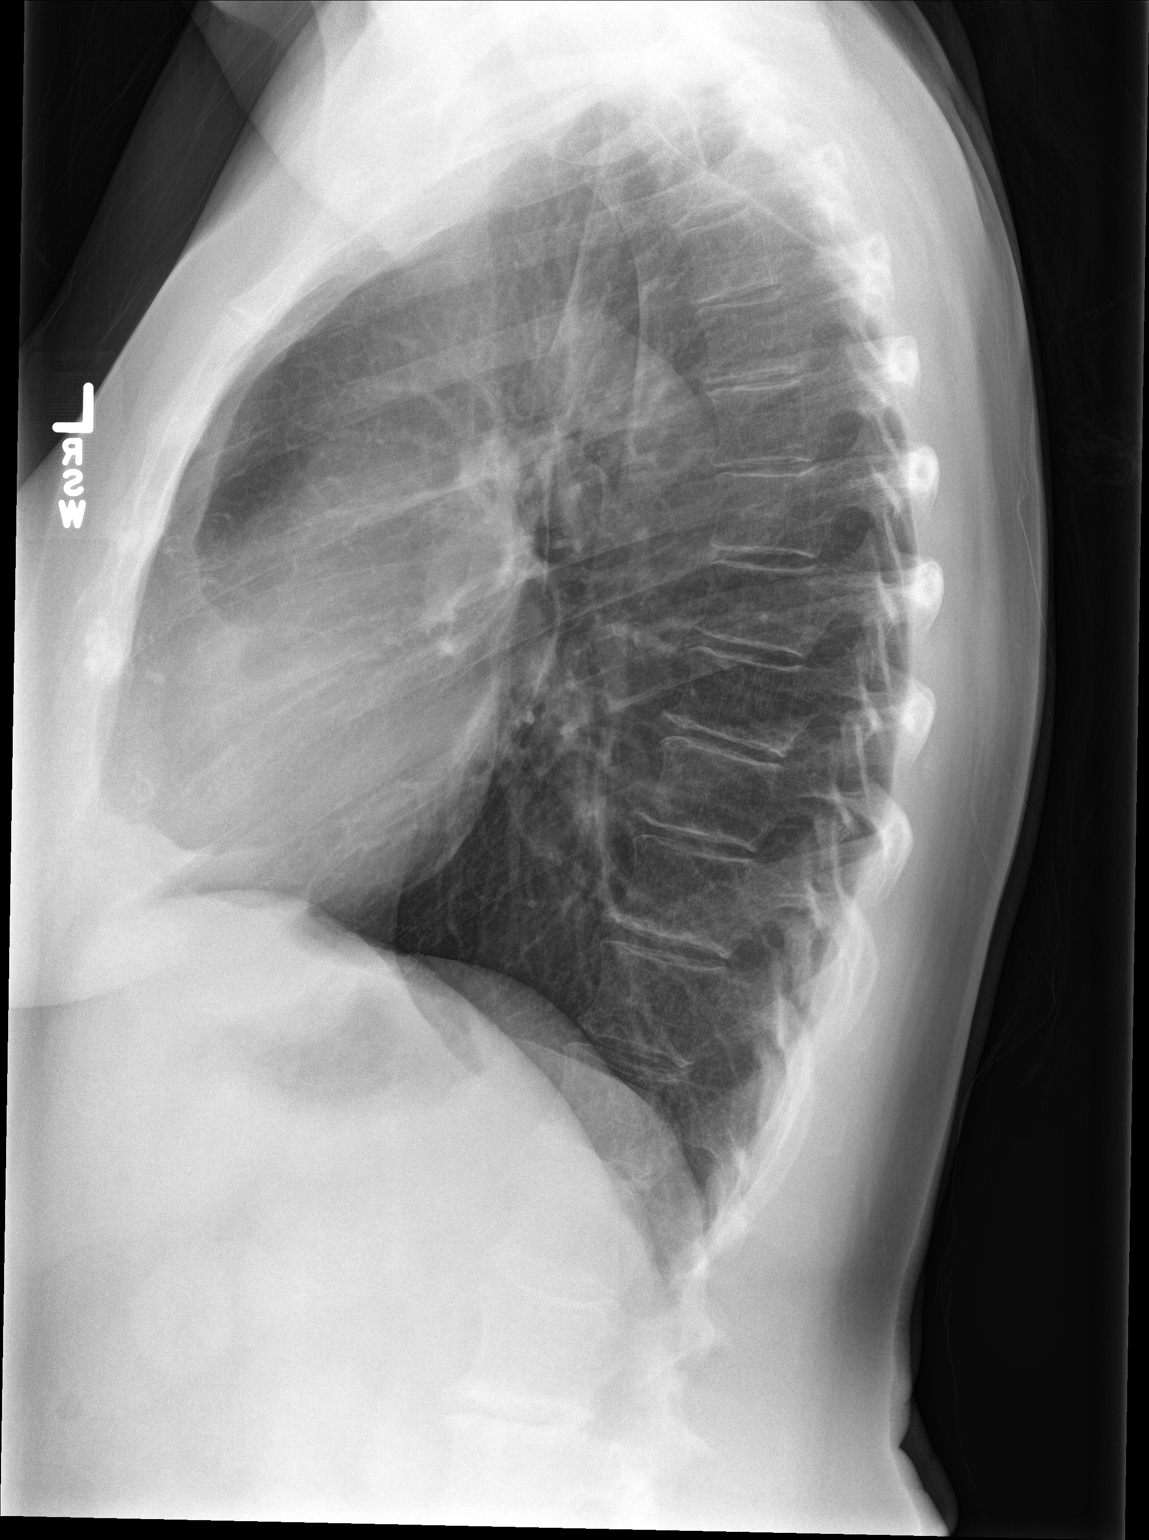

[2 of 2 positions shown; findings below may reference images not displayed]

FINDINGS: The heart size and mediastinal contours are within normal limits.
Both lungs are clear. The visualized skeletal structures are
unremarkable.
IMPRESSION: No active cardiopulmonary disease.

## 2023-05-22 ENCOUNTER — Other Ambulatory Visit (HOSPITAL_COMMUNITY)
Admission: RE | Admit: 2023-05-22 | Discharge: 2023-05-22 | Disposition: A | Discharge status: Home / Self Care | Payer: Medicare Other | Source: Ambulatory Visit | Attending: Internal Medicine | Admitting: Internal Medicine

## 2023-05-22 ENCOUNTER — Encounter: Payer: Self-pay | Admitting: Internal Medicine

## 2023-05-22 ENCOUNTER — Ambulatory Visit (INDEPENDENT_AMBULATORY_CARE_PROVIDER_SITE_OTHER): Payer: Medicare Other | Admitting: Internal Medicine

## 2023-05-22 VITALS — BP 124/76 | HR 82 | Ht 64.0 in | Wt 160.0 lb

## 2023-05-22 DIAGNOSIS — N898 Other specified noninflammatory disorders of vagina: Secondary | ICD-10-CM | POA: Diagnosis not present

## 2023-05-22 DIAGNOSIS — K219 Gastro-esophageal reflux disease without esophagitis: Secondary | ICD-10-CM

## 2023-05-22 DIAGNOSIS — Z1231 Encounter for screening mammogram for malignant neoplasm of breast: Secondary | ICD-10-CM | POA: Diagnosis not present

## 2023-05-22 DIAGNOSIS — E785 Hyperlipidemia, unspecified: Secondary | ICD-10-CM

## 2023-05-22 DIAGNOSIS — M62830 Muscle spasm of back: Secondary | ICD-10-CM

## 2023-05-22 DIAGNOSIS — Z1211 Encounter for screening for malignant neoplasm of colon: Secondary | ICD-10-CM | POA: Diagnosis not present

## 2023-05-22 DIAGNOSIS — Z23 Encounter for immunization: Secondary | ICD-10-CM | POA: Diagnosis not present

## 2023-05-22 DIAGNOSIS — Z0000 Encounter for general adult medical examination without abnormal findings: Secondary | ICD-10-CM

## 2023-05-22 DIAGNOSIS — F331 Major depressive disorder, recurrent, moderate: Secondary | ICD-10-CM

## 2023-05-22 DIAGNOSIS — K080 Exfoliation of teeth due to systemic causes: Secondary | ICD-10-CM | POA: Diagnosis not present

## 2023-05-22 MED ORDER — VENLAFAXINE HCL ER 150 MG PO CP24: 150.0000 mg | ORAL_CAPSULE | Freq: Every morning | ORAL | 1 refills | Status: DC

## 2023-05-22 MED ORDER — ARIPIPRAZOLE 2 MG PO TABS: 2.0000 mg | ORAL_TABLET | Freq: Every day | ORAL | 1 refills | Status: DC

## 2023-05-22 MED ORDER — METHOCARBAMOL 750 MG PO TABS
750.0000 mg | ORAL_TABLET | Freq: Every day | ORAL | 1 refills | Status: DC
Start: 2023-05-22 — End: 2023-10-04

## 2023-05-22 MED ORDER — FAMOTIDINE 20 MG PO TABS
20.0000 mg | ORAL_TABLET | Freq: Two times a day (BID) | ORAL | 0 refills | Status: DC
Start: 2023-05-22 — End: 2023-06-17

## 2023-05-22 MED ORDER — BUPROPION HCL ER (SR) 100 MG PO TB12
100.0000 mg | ORAL_TABLET | Freq: Every morning | ORAL | 1 refills | Status: DC
Start: 2023-05-22 — End: 2024-03-30

## 2023-05-22 NOTE — Progress Notes (Signed)
 Date:  05/22/2023   Name:  Desiree Grant   DOB:  October 25, 1955   MRN:  [id number]   Chief Complaint: Annual Exam, Depression, Gastroesophageal Reflux, Shoulder Pain (Left shoulder blade pain, and shooting pains down into both arms on and off. "Feels like a shock."), and Vaginal Discharge Desiree Grant is a 68 y.o. female who presents today for her Complete Annual Exam. She feels fairly well. She reports exercising none. She reports she is sleeping fairly well. Breast complaints nne.  Health Maintenance  Topic Date Due   HIV Screening  Never done   DTaP/Tdap/Td vaccine (1 - Tdap) Never done   Zoster (Shingles) Vaccine (1 of 2) Never done   COVID-19 Vaccine (2 - Janssen risk series) 12/30/2020   Colon Cancer Screening  05/08/2021   DEXA scan (bone density measurement)  Never done   Stool Blood Test  10/12/2022   Mammogram  07/05/2023   Medicare Annual Wellness Visit  08/15/2023   Pap with HPV screening  09/20/2025   Pneumonia Vaccine  Completed   Flu Shot  Completed   Hepatitis C Screening  Completed   HPV Vaccine  Aged Out     Depression        This is a chronic problem.  The problem has been gradually worsening (more anxiety and worry over boyfriends health issues) since onset.  Associated symptoms include no fatigue, no myalgias and no headaches.  Past treatments include SNRIs - Serotonin and norepinephrine reuptake inhibitors and other medications (Effexor , bupropion  and Abilify ).  Compliance with treatment is good. Gastroesophageal Reflux She complains of heartburn. She reports no abdominal pain, no chest pain, no coughing, no dysphagia, no sore throat or no wheezing. This is a recurrent problem. The current episode started more than 1 month ago. The problem occurs frequently. The problem has been unchanged. The heartburn is located in the substernum. The heartburn wakes her from sleep. Nothing aggravates the symptoms. Pertinent negatives include no fatigue.  Back Pain This is a  new problem. The current episode started 1 to 4 weeks ago. The problem occurs daily. The problem is unchanged. The pain is present in the thoracic spine and sacro-iliac. The quality of the pain is described as aching and burning. The pain is mild. Pertinent negatives include no abdominal pain, chest pain, dysuria, fever or headaches. She has tried nothing for the symptoms.    Review of Systems  Constitutional:  Negative for chills, fatigue and fever.  HENT:  Negative for congestion, hearing loss, sore throat, tinnitus, trouble swallowing and voice change.   Eyes:  Negative for visual disturbance.  Respiratory:  Negative for cough, chest tightness, shortness of breath and wheezing.   Cardiovascular:  Negative for chest pain, palpitations and leg swelling.  Gastrointestinal:  Positive for heartburn. Negative for abdominal pain, constipation, diarrhea, dysphagia and vomiting.  Endocrine: Negative for polydipsia and polyuria.  Genitourinary:  Positive for vaginal discharge (clear, not pruritic or painful). Negative for dysuria, frequency, genital sores and vaginal bleeding.  Musculoskeletal:  Positive for back pain. Negative for arthralgias, gait problem, joint swelling and myalgias.  Skin:  Negative for color change and rash.  Neurological:  Negative for dizziness, tremors, light-headedness and headaches.  Hematological:  Negative for adenopathy. Does not bruise/bleed easily.  Psychiatric/Behavioral:  Positive for depression, dysphoric mood and sleep disturbance. The patient is nervous/anxious.      Lab Results  Component Value Date   NA 143 09/25/2021   K 4.3 09/25/2021  CO2 24 09/25/2021   GLUCOSE 99 09/25/2021   BUN 13 09/25/2021   CREATININE 0.81 09/25/2021   CALCIUM 9.4 09/25/2021   EGFR 81 09/25/2021   GFRNONAA 71 09/16/2019   Lab Results  Component Value Date   CHOL 226 (H) 09/25/2021   HDL 69 09/25/2021   LDLCALC 146 (H) 09/25/2021   TRIG 63 09/25/2021   CHOLHDL 3.3  09/25/2021   Lab Results  Component Value Date   TSH 2.420 09/25/2021   Lab Results  Component Value Date   HGBA1C 5.2 09/25/2021   Lab Results  Component Value Date   WBC 3.4 09/25/2021   HGB 13.3 09/18/2022   HCT 40.1 09/18/2022   MCV 84 09/25/2021   PLT 203 09/25/2021   Lab Results  Component Value Date   ALT 15 09/25/2021   AST 18 09/25/2021   ALKPHOS 78 09/25/2021   BILITOT 0.6 09/25/2021   No results found for: "25OHVITD2", "25OHVITD3", "VD25OH"   Patient Active Problem List   Diagnosis Date Noted   Cervical adenopathy 03/21/2021   Chronic rhinitis 02/24/2021   Cough variant asthma with component of UACS  01/13/2021   Lipoma of right lower extremity 03/08/2020   Lumbar degenerative disc disease 11/02/2019   Gastroesophageal reflux disease 09/10/2018   Neoplasm of uncertain behavior of skin 09/13/2016   Muscle cramp, nocturnal 09/13/2016   Adenomatous colon polyp 02/07/2015   Hyperlipidemia, mild 02/06/2015   Fibrocystic breast changes 10/26/2014   Depression, major, recurrent, moderate (HCC) 10/26/2014    Allergies  Allergen Reactions   Niacin And Related Hives    blisters    Past Surgical History:  Procedure Laterality Date   ANTERIOR AND POSTERIOR REPAIR N/A 09/24/2022   Procedure: ANTERIOR (CYSTOCELE) AND POSTERIOR REPAIR (RECTOCELE);  Surgeon: Zenobia Hila, MD;  Location: [facility];  Service: Gynecology;  Laterality: N/A;   BLADDER SUSPENSION N/A 09/24/2022   Procedure: TOT;  Surgeon: Zenobia Hila, MD;  Location: [facility];  Service: Gynecology;  Laterality: N/A;   CATARACT EXTRACTION W/PHACO Right 09/02/2017   Procedure: CATARACT EXTRACTION PHACO AND INTRAOCULAR LENS PLACEMENT (IOC) RIGHT SYMFONY TORIC;  Surgeon: Annell Kidney, MD;  Location: [facility];  Service: Ophthalmology;  Laterality: Right;   CATARACT EXTRACTION W/PHACO Left 10/30/2017   Procedure: CATARACT EXTRACTION PHACO AND INTRAOCULAR LENS PLACEMENT (IOC)   LEFT SYMFONY LENS;  Surgeon: Annell Kidney, MD;  Location: [facility];  Service: Ophthalmology;  Laterality: Left;   COLONOSCOPY W/ POLYPECTOMY  2012   tubular adenoma 0.5cm was removed from descending colon   COSMETIC SURGERY     FACIAL COSMETIC SURGERY  2015   PARTIAL HYSTERECTOMY     bleeding; cervix and ovary remains    Social History   Tobacco Use   Smoking status: Never   Smokeless tobacco: Never  Vaping Use   Vaping status: Never Used  Substance Use Topics   Alcohol use: Never   Drug use: No     Medication list has been reviewed and updated.  Current Meds  Medication Sig   famotidine  (PEPCID ) 20 MG tablet Take 1 tablet (20 mg total) by mouth 2 (two) times daily.   methocarbamol  (ROBAXIN -750) 750 MG tablet Take 1 tablet (750 mg total) by mouth at bedtime.   [DISCONTINUED] ARIPiprazole  (ABILIFY ) 2 MG tablet Take 1 tablet (2 mg total) by mouth daily.   [DISCONTINUED] buPROPion  ER (WELLBUTRIN  SR) 100 MG 12 hr tablet Take 1 tablet (100 mg total) by mouth every morning.   [DISCONTINUED]  venlafaxine  XR (EFFEXOR -XR) 150 MG 24 hr capsule Take 1 capsule (150 mg total) by mouth every morning.       05/22/2023   10:57 AM 12/19/2022   11:24 AM 07/03/2022    1:40 PM 09/25/2021   10:51 AM  GAD 7 : Generalized Anxiety Score  Nervous, Anxious, on Edge 2 1 1  0  Control/stop worrying 2 3 1 2   Worry too much - different things 2 3 1 2   Trouble relaxing 2 0 0 0  Restless 2 0 0 1  Easily annoyed or irritable 3 1 1 1   Afraid - awful might happen 3 3 3 3   Total GAD 7 Score 16 11 7 9   Anxiety Difficulty Extremely difficult Somewhat difficult Somewhat difficult Somewhat difficult       05/22/2023   10:56 AM 12/19/2022   11:24 AM 08/15/2022    9:50 AM  Depression screen PHQ 2/9  Decreased Interest 0 0 1  Down, Depressed, Hopeless 0 0 0  PHQ - 2 Score 0 0 1  Altered sleeping 0 0 0  Tired, decreased energy 0 0 1  Change in appetite 0 0 0  Feeling bad or failure about  yourself  0 0 0  Trouble concentrating 0 0 0  Moving slowly or fidgety/restless 0 0 0  Suicidal thoughts 0 0 0  PHQ-9 Score 0 0 2  Difficult doing work/chores Not difficult at all Not difficult at all Not difficult at all    BP Readings from Last 3 Encounters:  05/22/23 124/76  11/27/22 129/79  11/01/22 117/80    Physical Exam Vitals and nursing note reviewed.  Constitutional:      General: She is not in acute distress.    Appearance: Normal appearance. She is well-developed.  HENT:     Head: Normocephalic and atraumatic.     Right Ear: Tympanic membrane and ear canal normal.     Left Ear: Tympanic membrane and ear canal normal.     Nose:     Right Sinus: No maxillary sinus tenderness.     Left Sinus: No maxillary sinus tenderness.  Eyes:     General: No scleral icterus.       Right eye: No discharge.        Left eye: No discharge.     Conjunctiva/sclera: Conjunctivae normal.  Neck:     Thyroid: No thyromegaly.     Vascular: No carotid bruit.  Cardiovascular:     Rate and Rhythm: Normal rate and regular rhythm.     Pulses: Normal pulses.     Heart sounds: Normal heart sounds.  Pulmonary:     Effort: Pulmonary effort is normal. No respiratory distress.     Breath sounds: No wheezing.  Abdominal:     General: Bowel sounds are normal.     Palpations: Abdomen is soft.     Tenderness: There is no abdominal tenderness.  Genitourinary:    Comments: Exam deferred Musculoskeletal:     Cervical back: Normal range of motion. No erythema.     Thoracic back: Spasms and tenderness present. Normal range of motion.     Right hip: No bony tenderness. Decreased range of motion.     Left hip: No bony tenderness. Decreased range of motion.     Right lower leg: No edema.     Left lower leg: No edema.  Lymphadenopathy:     Cervical: No cervical adenopathy.  Skin:    General: Skin is warm and dry.  Findings: No rash.  Neurological:     Mental Status: She is alert and oriented  to person, place, and time.     Cranial Nerves: No cranial nerve deficit.     Sensory: No sensory deficit.     Deep Tendon Reflexes: Reflexes are normal and symmetric.  Psychiatric:        Attention and Perception: Attention normal.        Mood and Affect: Mood normal.      Wt Readings from Last 3 Encounters:  05/22/23 160 lb (72.6 kg)  12/19/22 150 lb (68 kg)  11/27/22 153 lb 1.6 oz (69.4 kg)    BP 124/76   Pulse 82   Ht 5\' 4"  (1.626 m)   Wt 160 lb (72.6 kg)   SpO2 97%   BMI 27.46 kg/m   Assessment and Plan:  Problem List Items Addressed This Visit       Unprioritized   Depression, major, recurrent, moderate (HCC) (Chronic)   Clinically stable on Effexor , bupropion  and Abilify  with good response, No SI or HI reported. Some increase in anxiety over social issues - will not change medications at this time (pt was hoping to reduce doses) No change in management at this time.       Relevant Medications   ARIPiprazole  (ABILIFY ) 2 MG tablet   buPROPion  ER (WELLBUTRIN  SR) 100 MG 12 hr tablet   venlafaxine  XR (EFFEXOR -XR) 150 MG 24 hr capsule   Other Relevant Orders   TSH   Hyperlipidemia, mild (Chronic)   Lipids controlled with diet only.       Relevant Orders   Comprehensive metabolic panel   Lipid panel   Gastroesophageal reflux disease   Recurrent reflux recently with stress at home. Will start Pepcid  bid for one month then reassess.      Relevant Medications   famotidine  (PEPCID ) 20 MG tablet   Other Relevant Orders   CBC with Differential/Platelet   Other Visit Diagnoses       Annual physical exam    -  Primary     Encounter for screening mammogram for breast cancer       Relevant Orders   MM 3D SCREENING MAMMOGRAM BILATERAL BREAST     Immunization due       Relevant Orders   Flu Vaccine Trivalent High Dose (Fluad) (Completed)   Pneumococcal conjugate vaccine 20-valent (Prevnar 20) (Completed)     Vaginal discharge       suspect normal  discharge will await Aptima results   Relevant Orders   Cervicovaginal ancillary only     Back muscle spasm       in upper back and SI region from overuse caring for her boyfriend with many health issues use heat or ice as needed Robaxin  at bedtime   Relevant Medications   methocarbamol  (ROBAXIN -750) 750 MG tablet     Colon cancer screening       Relevant Orders   Fecal occult blood, imunochemical       No follow-ups on file.    Sheron Dixons, MD [facility] [place]

## 2023-05-22 NOTE — Assessment & Plan Note (Signed)
 Lipids controlled with diet only.

## 2023-05-22 NOTE — Assessment & Plan Note (Signed)
 Recurrent reflux recently with stress at home. Will start Pepcid  bid for one month then reassess.

## 2023-05-22 NOTE — Assessment & Plan Note (Addendum)
 Clinically stable on Effexor , bupropion  and Abilify  with good response, No SI or HI reported. Some increase in anxiety over social issues - will not change medications at this time (pt was hoping to reduce doses) No change in management at this time.

## 2023-05-22 NOTE — Patient Instructions (Addendum)
 Call [facility] to schedule your mammogram at [phone].

## 2023-05-23 LAB — LIPID PANEL
Chol/HDL Ratio: 3.6 {ratio} (ref 0.0–4.4)
Cholesterol, Total: 239 mg/dL — ABNORMAL HIGH (ref 100–199)
HDL: 66 mg/dL (ref >39)
LDL Chol Calc (NIH): 157 mg/dL — ABNORMAL HIGH (ref 0–99)
Triglycerides: 92 mg/dL (ref 0–149)
VLDL Cholesterol Cal: 16 mg/dL (ref 5–40)

## 2023-05-23 LAB — COMPREHENSIVE METABOLIC PANEL
ALT: 9 [IU]/L (ref 0–32)
AST: 13 [IU]/L (ref 0–40)
Albumin: 4.5 g/dL (ref 3.9–4.9)
Alkaline Phosphatase: 83 [IU]/L (ref 44–121)
BUN/Creatinine Ratio: 17 (ref 12–28)
BUN: 16 mg/dL (ref 8–27)
Bilirubin Total: 0.9 mg/dL (ref 0.0–1.2)
CO2: 24 mmol/L (ref 20–29)
Calcium: 9.3 mg/dL (ref 8.7–10.3)
Chloride: 106 mmol/L (ref 96–106)
Creatinine, Ser: 0.94 mg/dL (ref 0.57–1.00)
Globulin, Total: 1.8 g/dL (ref 1.5–4.5)
Glucose: 94 mg/dL (ref 70–99)
Potassium: 4 mmol/L (ref 3.5–5.2)
Sodium: 143 mmol/L (ref 134–144)
Total Protein: 6.3 g/dL (ref 6.0–8.5)
eGFR: 67 mL/min/{1.73_m2} (ref >59)

## 2023-05-23 LAB — CBC WITH DIFFERENTIAL/PLATELET
Basophils Absolute: 0.1 10*3/uL (ref 0.0–0.2)
Basos: 2 %
EOS (ABSOLUTE): 0.1 10*3/uL (ref 0.0–0.4)
Eos: 1 %
Hematocrit: 42.8 % (ref 34.0–46.6)
Hemoglobin: 14.2 g/dL (ref 11.1–15.9)
Immature Grans (Abs): 0 10*3/uL (ref 0.0–0.1)
Immature Granulocytes: 0 %
Lymphocytes Absolute: 1.3 10*3/uL (ref 0.7–3.1)
Lymphs: 29 %
MCH: 28.7 pg (ref 26.6–33.0)
MCHC: 33.2 g/dL (ref 31.5–35.7)
MCV: 87 fL (ref 79–97)
Monocytes Absolute: 0.3 10*3/uL (ref 0.1–0.9)
Monocytes: 7 %
Neutrophils Absolute: 2.9 10*3/uL (ref 1.4–7.0)
Neutrophils: 61 %
Platelets: 205 10*3/uL (ref 150–450)
RBC: 4.95 x10E6/uL (ref 3.77–5.28)
RDW: 11.9 % (ref 11.7–15.4)
WBC: 4.7 10*3/uL (ref 3.4–10.8)

## 2023-05-23 LAB — CERVICOVAGINAL ANCILLARY ONLY
Comment: NEGATIVE
Comment: NEGATIVE
Comment: NEGATIVE
Comment: NEGATIVE
Comment: NEGATIVE
Comment: NORMAL

## 2023-05-23 LAB — TSH: TSH: 0.944 u[IU]/mL (ref 0.450–4.500)

## 2023-06-07 DIAGNOSIS — Z1211 Encounter for screening for malignant neoplasm of colon: Secondary | ICD-10-CM | POA: Diagnosis not present

## 2023-06-09 LAB — SPECIMEN STATUS REPORT

## 2023-06-09 LAB — FECAL OCCULT BLOOD, IMMUNOCHEMICAL: Fecal Occult Bld: NEGATIVE

## 2023-06-12 ENCOUNTER — Encounter: Payer: Self-pay | Admitting: Internal Medicine

## 2023-06-14 ENCOUNTER — Other Ambulatory Visit: Payer: Self-pay | Admitting: Internal Medicine

## 2023-06-14 DIAGNOSIS — K219 Gastro-esophageal reflux disease without esophagitis: Secondary | ICD-10-CM

## 2023-06-17 NOTE — Telephone Encounter (Signed)
 Requested Prescriptions  Pending Prescriptions Disp Refills   famotidine  (PEPCID ) 20 MG tablet [Pharmacy Med Name: FAMOTIDINE  20 MG TABLET] 180 tablet 0    Sig: TAKE 1 TABLET BY MOUTH TWICE A DAY     Gastroenterology:  H2 Antagonists Passed - 06/17/2023  9:13 AM      Passed - Valid encounter within last 12 months    Recent Outpatient Visits           3 weeks ago Annual physical exam   [place] [facility] at [facility], Chales [place], MD   6 months ago Depression, major, recurrent, moderate [facility])   [place] [facility] at [facility], Chales [place], MD   11 months ago Thrombosed external hemorrhoid   [place] [facility] at [facility], Chales [place], MD   1 year ago Annual physical exam   [facility] at [facility], Chales [place], MD   1 year ago Exacerbation of allergic asthma   [place] [facility] at [facility], Chales [place], MD       Future Appointments             In 1 year [place], Arleen Lacer, [place] [facility] at [facility], [facility]

## 2023-07-04 ENCOUNTER — Other Ambulatory Visit: Payer: Self-pay | Admitting: Internal Medicine

## 2023-07-04 DIAGNOSIS — F331 Major depressive disorder, recurrent, moderate: Secondary | ICD-10-CM

## 2023-07-04 NOTE — Telephone Encounter (Signed)
 Requested Prescriptions  Refused Prescriptions Disp Refills   buPROPion ER (WELLBUTRIN SR) 100 MG 12 hr tablet [Pharmacy Med Name: BUPROPION HCL SR 100 MG TABLET] 90 tablet 1    Sig: TAKE 1 TABLET BY MOUTH EVERY DAY IN THE MORNING     Psychiatry: Antidepressants - bupropion Passed - 07/04/2023  4:42 PM      Passed - Cr in normal range and within 360 days    Creatinine  Date Value Ref Range Status  11/15/2012 1.02 0.60 - 1.30 mg/dL Final   Creatinine, Ser  Date Value Ref Range Status  05/22/2023 0.94 0.57 - 1.00 mg/dL Final         Passed - AST in normal range and within 360 days    AST  Date Value Ref Range Status  05/22/2023 13 0 - 40 IU/L Final   SGOT(AST)  Date Value Ref Range Status  11/15/2012 16 15 - 37 Unit/L Final         Passed - ALT in normal range and within 360 days    ALT  Date Value Ref Range Status  05/22/2023 9 0 - 32 IU/L Final   SGPT (ALT)  Date Value Ref Range Status  11/15/2012 18 12 - 78 U/L Final         Passed - Completed PHQ-2 or PHQ-9 in the last 360 days      Passed - Last BP in normal range    BP Readings from Last 1 Encounters:  05/22/23 124/76         Passed - Valid encounter within last 6 months    Recent Outpatient Visits           1 month ago Annual physical exam   [place] [facility] at [facility], Nyoka Cowden, MD   6 months ago Depression, major, recurrent, moderate (HCC)   [place] [facility] at [facility], Nyoka Cowden, MD   1 year ago Thrombosed external hemorrhoid   [place] [facility] at [facility], Nyoka Cowden, MD   1 year ago Annual physical exam   [facility] at [facility], Nyoka Cowden, MD   1 year ago Exacerbation of allergic asthma   [place] [facility] at [facility], Nyoka Cowden, MD       Future Appointments             In 11 months  Mordecai Maes, Melton Alar, PA [facility] at [facility] [place], [facility]             venlafaxine XR (EFFEXOR-XR) 150 MG 24 hr capsule [Pharmacy Med Name: VENLAFAXINE HCL ER 150 MG CAP] 90 capsule 1    Sig: TAKE 1 CAPSULE BY MOUTH EVERY MORNING.     Psychiatry: Antidepressants - SNRI - desvenlafaxine & venlafaxine Failed - 07/04/2023  4:42 PM      Failed - Lipid Panel in normal range within the last 12 months    Cholesterol, Total  Date Value Ref Range Status  05/22/2023 239 (H) 100 - 199 mg/dL Final   LDL Chol Calc (NIH)  Date Value Ref Range Status  05/22/2023 157 (H) 0 - 99 mg/dL Final   HDL  Date Value Ref Range Status  05/22/2023 66 >39 mg/dL Final   Triglycerides  Date Value Ref Range Status  05/22/2023 92 0 - 149  mg/dL Final         Passed - Cr in normal range and within 360 days    Creatinine  Date Value Ref Range Status  11/15/2012 1.02 0.60 - 1.30 mg/dL Final   Creatinine, Ser  Date Value Ref Range Status  05/22/2023 0.94 0.57 - 1.00 mg/dL Final         Passed - Completed PHQ-2 or PHQ-9 in the last 360 days      Passed - Last BP in normal range    BP Readings from Last 1 Encounters:  05/22/23 124/76         Passed - Valid encounter within last 6 months    Recent Outpatient Visits           1 month ago Annual physical exam   [place] [facility] at [facility], Nyoka Cowden, MD   6 months ago Depression, major, recurrent, moderate (HCC)   [place] [facility] at [facility], Nyoka Cowden, MD   1 year ago Thrombosed external hemorrhoid   [place] [facility] at [facility], Nyoka Cowden, MD   1 year ago Annual physical exam   [facility] at [facility], Nyoka Cowden, MD   1 year ago Exacerbation of allergic asthma   [place] [facility] at [facility], Nyoka Cowden, MD        Future Appointments             In 11 months W[place]ell, Melton Alar, PA [facility] at [facility], [facility]

## 2023-08-03 ENCOUNTER — Encounter: Payer: Self-pay | Admitting: Internal Medicine

## 2023-08-12 ENCOUNTER — Telehealth: Payer: Self-pay | Admitting: Internal Medicine

## 2023-08-12 NOTE — Telephone Encounter (Signed)
 Copied from CRM [id number]. Topic: Medicare AWV >> Aug 12, 2023  9:40 AM Payton Doughty wrote: Reason for CRM: Called LVM 08/12/2023 to schedule AWV. Please schedule Virtual or Telehealth visits ONLY.   Verlee Rossetti; Care Guide Ambulatory Clinical Support [place] [facility] Direct Dial: [phone]

## 2023-08-28 ENCOUNTER — Ambulatory Visit (INDEPENDENT_AMBULATORY_CARE_PROVIDER_SITE_OTHER): Admitting: Internal Medicine

## 2023-08-28 ENCOUNTER — Encounter: Payer: Self-pay | Admitting: Internal Medicine

## 2023-08-28 VITALS — BP 124/82 | HR 93 | Temp 98.4°F | Ht 64.0 in | Wt 166.0 lb

## 2023-08-28 DIAGNOSIS — J45991 Cough variant asthma: Secondary | ICD-10-CM | POA: Diagnosis not present

## 2023-08-28 MED ORDER — PROMETHAZINE-DM 6.25-15 MG/5ML PO SYRP
5.0000 mL | ORAL_SOLUTION | Freq: Four times a day (QID) | ORAL | 0 refills | Status: AC | PRN
Start: 2023-08-28 — End: 2023-09-06

## 2023-08-28 MED ORDER — AZITHROMYCIN 250 MG PO TABS
ORAL_TABLET | ORAL | 0 refills | Status: AC
Start: 2023-08-28 — End: 2023-09-02

## 2023-08-28 NOTE — Patient Instructions (Signed)
 Resume Famotidine  in the event that some of this is reflux.

## 2023-08-28 NOTE — Assessment & Plan Note (Signed)
 Suspect some component of GERD so recommend resuming Pepcid  for 2 weeks Will treat with Zpak and cough syrup Since VS are normal - defer CXR unless no improvement.

## 2023-08-28 NOTE — Progress Notes (Signed)
 Date:  08/28/2023   Name:  Desiree Grant   DOB:  1956/03/27   MRN:  [id number]   Chief Complaint: Cough (Patient said she has had the cough for 6 weeks, dry/wet cough, has been using cough drops, no fever, body aces from coughing ) and Headache  Cough This is a new problem. Episode onset: 6 weeks. The problem occurs every few minutes. The cough is Non-productive. Associated symptoms include wheezing. Pertinent negatives include no chest pain, chills, headaches, myalgias or shortness of breath. She has tried OTC cough suppressant for the symptoms.    Review of Systems  Constitutional:  Positive for fatigue. Negative for chills and unexpected weight change.  HENT:  Negative for trouble swallowing.   Eyes:  Negative for visual disturbance.  Respiratory:  Positive for cough and wheezing. Negative for chest tightness and shortness of breath.   Cardiovascular:  Negative for chest pain, palpitations and leg swelling.  Gastrointestinal:  Negative for abdominal pain, constipation and diarrhea.  Musculoskeletal:  Negative for arthralgias and myalgias.  Neurological:  Negative for dizziness, weakness, light-headedness and headaches.  Psychiatric/Behavioral:  Positive for sleep disturbance.      Lab Results  Component Value Date   NA 143 05/22/2023   K 4.0 05/22/2023   CO2 24 05/22/2023   GLUCOSE 94 05/22/2023   BUN 16 05/22/2023   CREATININE 0.94 05/22/2023   CALCIUM 9.3 05/22/2023   EGFR 67 05/22/2023   GFRNONAA 71 09/16/2019   Lab Results  Component Value Date   CHOL 239 (H) 05/22/2023   HDL 66 05/22/2023   LDLCALC 157 (H) 05/22/2023   TRIG 92 05/22/2023   CHOLHDL 3.6 05/22/2023   Lab Results  Component Value Date   TSH 0.944 05/22/2023   Lab Results  Component Value Date   HGBA1C 5.2 09/25/2021   Lab Results  Component Value Date   WBC 4.7 05/22/2023   HGB 14.2 05/22/2023   HCT 42.8 05/22/2023   MCV 87 05/22/2023   PLT 205 05/22/2023   Lab Results  Component  Value Date   ALT 9 05/22/2023   AST 13 05/22/2023   ALKPHOS 83 05/22/2023   BILITOT 0.9 05/22/2023   No results found for: "25OHVITD2", "25OHVITD3", "VD25OH"   Patient Active Problem List   Diagnosis Date Noted   Cervical adenopathy 03/21/2021   Chronic rhinitis 02/24/2021   Cough variant asthma with component of UACS  01/13/2021   Lipoma of right lower extremity 03/08/2020   Lumbar degenerative disc disease 11/02/2019   Gastroesophageal reflux disease 09/10/2018   Neoplasm of uncertain behavior of skin 09/13/2016   Muscle cramp, nocturnal 09/13/2016   Adenomatous colon polyp 02/07/2015   Hyperlipidemia, mild 02/06/2015   Fibrocystic breast changes 10/26/2014   Depression, major, recurrent, moderate (HCC) 10/26/2014    Allergies  Allergen Reactions   Niacin And Related Hives    blisters    Past Surgical History:  Procedure Laterality Date   ANTERIOR AND POSTERIOR REPAIR N/A 09/24/2022   Procedure: ANTERIOR (CYSTOCELE) AND POSTERIOR REPAIR (RECTOCELE);  Surgeon: Zenobia Hila, MD;  Location: [facility];  Service: Gynecology;  Laterality: N/A;   BLADDER SUSPENSION N/A 09/24/2022   Procedure: TOT;  Surgeon: Zenobia Hila, MD;  Location: [facility];  Service: Gynecology;  Laterality: N/A;   CATARACT EXTRACTION W/PHACO Right 09/02/2017   Procedure: CATARACT EXTRACTION PHACO AND INTRAOCULAR LENS PLACEMENT (IOC) RIGHT SYMFONY TORIC;  Surgeon: Annell Kidney, MD;  Location: [facility];  Service: Ophthalmology;  Laterality:  Right;   CATARACT EXTRACTION W/PHACO Left 10/30/2017   Procedure: CATARACT EXTRACTION PHACO AND INTRAOCULAR LENS PLACEMENT (IOC)  LEFT SYMFONY LENS;  Surgeon: Annell Kidney, MD;  Location[facility];  Service: Ophthalmology;  Laterality: Left;   COLONOSCOPY W/ POLYPECTOMY  2012   tubular adenoma 0.5cm was removed from descending colon   COSMETIC SURGERY     FACIAL COSMETIC SURGERY  2015   PARTIAL HYSTERECTOMY     bleeding;  cervix and ovary remains    Social History   Tobacco Use   Smoking status: Never   Smokeless tobacco: Never  Vaping Use   Vaping status: Never Used  Substance Use Topics   Alcohol use: Never   Drug use: No     Medication list has been reviewed and updated.  Current Meds  Medication Sig   ARIPiprazole  (ABILIFY ) 2 MG tablet Take 1 tablet (2 mg total) by mouth daily.   azithromycin  (ZITHROMAX  Z-PAK) 250 MG tablet UAD   buPROPion  ER (WELLBUTRIN  SR) 100 MG 12 hr tablet Take 1 tablet (100 mg total) by mouth every morning.   promethazine -dextromethorphan (PROMETHAZINE -DM) 6.25-15 MG/5ML syrup Take 5 mLs by mouth 4 (four) times daily as needed for up to 9 days for cough.   venlafaxine  XR (EFFEXOR -XR) 150 MG 24 hr capsule Take 1 capsule (150 mg total) by mouth every morning.       08/28/2023    2:04 PM 08/28/2023    2:01 PM 05/22/2023   10:57 AM 12/19/2022   11:24 AM  GAD 7 : Generalized Anxiety Score  Nervous, Anxious, on Edge 0 0 2 1  Control/stop worrying 0 0 2 3  Worry too much - different things 0  2 3  Trouble relaxing 0  2 0  Restless 0  2 0  Easily annoyed or irritable 0  3 1  Afraid - awful might happen 0  3 3  Total GAD 7 Score 0  16 11  Anxiety Difficulty Not difficult at all  Extremely difficult Somewhat difficult       08/28/2023    2:05 PM 08/28/2023    2:01 PM 05/22/2023   10:56 AM  Depression screen PHQ 2/9  Decreased Interest 0 0 0  Down, Depressed, Hopeless 0 0 0  PHQ - 2 Score 0 0 0  Altered sleeping 0  0  Tired, decreased energy 0  0  Change in appetite 0  0  Feeling bad or failure about yourself  0  0  Trouble concentrating 0  0  Moving slowly or fidgety/restless 0  0  Suicidal thoughts 0  0  PHQ-9 Score 0  0  Difficult doing work/chores Not difficult at all  Not difficult at all    BP Readings from Last 3 Encounters:  08/28/23 124/82  05/22/23 124/76  11/27/22 129/79    Physical Exam Vitals and nursing note reviewed.  Constitutional:       General: She is not in acute distress.    Appearance: She is well-developed. She is not ill-appearing.  HENT:     Head: Normocephalic and atraumatic.     Mouth/Throat:     Pharynx: No oropharyngeal exudate or posterior oropharyngeal erythema.  Cardiovascular:     Rate and Rhythm: Normal rate and regular rhythm.  Pulmonary:     Effort: Pulmonary effort is normal. No respiratory distress.     Breath sounds: Normal breath sounds. No wheezing or rhonchi.     Comments: Frequent loose cough during exam Skin:  General: Skin is warm and dry.     Findings: No rash.  Neurological:     Mental Status: She is alert and oriented to person, place, and time.     Gait: Gait normal.  Psychiatric:        Mood and Affect: Mood normal.        Behavior: Behavior normal.     Wt Readings from Last 3 Encounters:  08/28/23 166 lb (75.3 kg)  05/22/23 160 lb (72.6 kg)  12/19/22 150 lb (68 kg)    BP 124/82   Pulse 93   Temp 98.4 F (36.9 C)   Ht 5\' 4"  (1.626 m)   Wt 166 lb (75.3 kg)   SpO2 95%   BMI 28.49 kg/m   Assessment and Plan:  Problem List Items Addressed This Visit       Unprioritized   Cough variant asthma with component of UACS  - Primary (Chronic)   Suspect some component of GERD so recommend resuming Pepcid  for 2 weeks Will treat with Zpak and cough syrup Since VS are normal - defer CXR unless no improvement.      Relevant Medications   azithromycin  (ZITHROMAX  Z-PAK) 250 MG tablet   promethazine -dextromethorphan (PROMETHAZINE -DM) 6.25-15 MG/5ML syrup    No follow-ups on file.    Sheron Dixons, MD [facility] [place]

## 2023-09-24 ENCOUNTER — Telehealth: Payer: Self-pay

## 2023-09-24 NOTE — Telephone Encounter (Signed)
 Patient calling with concerns. She had vaginal prolapse surgery done last year by Dr. Denman Fischer. She wants to know could this be a recurrent issue. She has been having some severe back pain and strains to have a bowel movement. Please advise.

## 2023-09-26 ENCOUNTER — Ambulatory Visit: Admission: EM | Admit: 2023-09-26 | Discharge: 2023-09-26 | Disposition: A | Discharge status: Home / Self Care

## 2023-09-26 DIAGNOSIS — N3001 Acute cystitis with hematuria: Secondary | ICD-10-CM | POA: Diagnosis not present

## 2023-09-26 DIAGNOSIS — S39012A Strain of muscle, fascia and tendon of lower back, initial encounter: Secondary | ICD-10-CM | POA: Insufficient documentation

## 2023-09-26 LAB — URINALYSIS, W/ REFLEX TO CULTURE (INFECTION SUSPECTED)
Bilirubin Urine: NEGATIVE
Glucose, UA: NEGATIVE mg/dL
Ketones, ur: NEGATIVE mg/dL
Nitrite: NEGATIVE
Protein, ur: NEGATIVE mg/dL
Specific Gravity, Urine: 1.01 (ref 1.005–1.030)
pH: 6.5 (ref 5.0–8.0)

## 2023-09-26 MED ORDER — BACLOFEN 10 MG PO TABS: 10.0000 mg | ORAL_TABLET | Freq: Three times a day (TID) | ORAL | 0 refills | Status: DC

## 2023-09-26 MED ORDER — PHENAZOPYRIDINE HCL 200 MG PO TABS: 200.0000 mg | ORAL_TABLET | Freq: Three times a day (TID) | ORAL | 0 refills | Status: DC

## 2023-09-26 MED ORDER — CEPHALEXIN 500 MG PO CAPS: 500.0000 mg | ORAL_CAPSULE | Freq: Three times a day (TID) | ORAL | 0 refills | Status: AC

## 2023-09-26 MED ORDER — DICLOFENAC SODIUM 50 MG PO TBEC: 50.0000 mg | DELAYED_RELEASE_TABLET | Freq: Two times a day (BID) | ORAL | 1 refills | Status: DC

## 2023-09-26 NOTE — Discharge Instructions (Addendum)
  1. Acute cystitis with hematuria (Primary) - Urinalysis, w/ Reflex to Culture (Infection Suspected) -complaining UC shows trace leukocytes, trace bacteria, trace blood, these findings are indicative of urinary tract infection - Urine Culture sent to lab for further testing results should be available in 2 to 3 days. - cephALEXin (KEFLEX) 500 MG capsule; Take 1 capsule (500 mg total) by mouth 3 (three) times daily for 5 days.  Dispense: 15 capsule; Refill: 0 - phenazopyridine (PYRIDIUM) 200 MG tablet; Take 1 tablet (200 mg total) by mouth 3 (three) times daily.  Dispense: 6 tablet; Refill: 0  2. Lumbar strain, initial encounter - baclofen (LIORESAL) 10 MG tablet; Take 1 tablet (10 mg total) by mouth 3 (three) times daily.  Dispense: 30 each; Refill: 0 - diclofenac (VOLTAREN) 50 MG EC tablet; Take 1 tablet (50 mg total) by mouth 2 (two) times daily.  Dispense: 30 tablet; Refill: 1 -Unfortunately kidney stone cannot be ruled out without CT scan of the abdomen or ultrasound of the kidney.  If symptoms continue to manifest follow-up in ER for evaluation and treatment. -Continue to monitor symptoms for any change in severity if there is any escalation of current symptoms or development of new symptoms follow-up in ER for further evaluation and management.

## 2023-09-26 NOTE — ED Triage Notes (Signed)
 Pt c/o lower left back pain that shoots toward the hips, urinary frequency, pain when laying down in bed x3days  Pt is worried about a UTI  Pt states that she has continuous vaginal discharge since she had a vaginal prolapse surgery in 2024.

## 2023-09-26 NOTE — ED Provider Notes (Signed)
 [facility]  Note:  This document was prepared using Conservation officer, historic buildings and may include unintentional dictation errors.  MRN: [id number] DOB: [birth year]  Subjective:   Desiree Grant is a 68 y.o. female presenting for left lower back pain with radiation around to the abdomen on the left lower quadrant, increased urinary frequency, bladder pain/dysuria x 3 days.  Patient reports that she has severe difficulty getting up from laying down in bed due to left lower back pain.  Patient states that symptoms are usually worse in the morning and improve usually by early afternoon.  Patient reports prior history of uterine prolapse with surgical repair in 2024.  Patient states that she was concern for UTI due to ongoing continuous vaginal discharge since prolapse surgery.  Patient denies any abnormal urinary or vaginal odor, vaginal pain, vaginal lesions, flank pain.  Patient does state that she was constipated and took a laxative which caused her to have mild diarrhea prior to the onset of UTI symptoms.  No current facility-administered medications for this encounter.  Current Outpatient Medications:    ARIPiprazole  (ABILIFY ) 2 MG tablet, Take 1 tablet (2 mg total) by mouth daily., Disp: 90 tablet, Rfl: 1   baclofen (LIORESAL) 10 MG tablet, Take 1 tablet (10 mg total) by mouth 3 (three) times daily., Disp: 30 each, Rfl: 0   buPROPion  ER (WELLBUTRIN  SR) 100 MG 12 hr tablet, Take 1 tablet (100 mg total) by mouth every morning., Disp: 90 tablet, Rfl: 1   cephALEXin (KEFLEX) 500 MG capsule, Take 1 capsule (500 mg total) by mouth 3 (three) times daily for 5 days., Disp: 15 capsule, Rfl: 0   diclofenac (VOLTAREN) 50 MG EC tablet, Take 1 tablet (50 mg total) by mouth 2 (two) times daily., Disp: 30 tablet, Rfl: 1   famotidine  (PEPCID ) 20 MG tablet, TAKE 1 TABLET BY MOUTH TWICE A DAY, Disp: 180 tablet, Rfl: 0   methocarbamol  (ROBAXIN -750) 750 MG tablet, Take 1 tablet (750 mg total) by  mouth at bedtime., Disp: 30 tablet, Rfl: 1   phenazopyridine (PYRIDIUM) 200 MG tablet, Take 1 tablet (200 mg total) by mouth 3 (three) times daily., Disp: 6 tablet, Rfl: 0   venlafaxine  XR (EFFEXOR -XR) 150 MG 24 hr capsule, Take 1 capsule (150 mg total) by mouth every morning., Disp: 90 capsule, Rfl: 1   Allergies  Allergen Reactions   Niacin And Related Hives    blisters    Past Medical History:  Diagnosis Date   Anxiety    Arthritis    Bowel trouble 2010   Depression, major, recurrent, moderate (HCC) 10/26/2014   GERD (gastroesophageal reflux disease)    Hyperlipidemia, mild 02/06/2015   TC 218 TG 66 HDL 60  LDL 146    Motion sickness    boats   Shoulder strain, right, initial encounter 09/13/2016   Special screening for malignant neoplasms, colon 2012     Past Surgical History:  Procedure Laterality Date   ANTERIOR AND POSTERIOR REPAIR N/A 09/24/2022   Procedure: ANTERIOR (CYSTOCELE) AND POSTERIOR REPAIR (RECTOCELE);  Surgeon: Zenobia Hila, MD;  Location: [facility];  Service: Gynecology;  Laterality: N/A;   BLADDER SUSPENSION N/A 09/24/2022   Procedure: TOT;  Surgeon: Zenobia Hila, MD;  Location: [facility];  Service: Gynecology;  Laterality: N/A;   CATARACT EXTRACTION W/PHACO Right 09/02/2017   Procedure: CATARACT EXTRACTION PHACO AND INTRAOCULAR LENS PLACEMENT (IOC) RIGHT SYMFONY TORIC;  Surgeon: Annell Kidney, MD;  Location: [facility];  Service: Ophthalmology;  Laterality: Right;   CATARACT EXTRACTION W/PHACO Left 10/30/2017   Procedure: CATARACT EXTRACTION PHACO AND INTRAOCULAR LENS PLACEMENT (IOC)  LEFT SYMFONY LENS;  Surgeon: Annell Kidney, MD;  Location: [facility];  Service: Ophthalmology;  Laterality: Left;   COLONOSCOPY W/ POLYPECTOMY  2012   tubular adenoma 0.5cm was removed from descending colon   COSMETIC SURGERY     FACIAL COSMETIC SURGERY  2015   PARTIAL HYSTERECTOMY     bleeding; cervix and ovary remains    Family  History  Problem Relation Age of Onset   COPD Mother     Social History   Tobacco Use   Smoking status: Never   Smokeless tobacco: Never  Vaping Use   Vaping status: Never Used  Substance Use Topics   Alcohol use: Never   Drug use: No    ROS Refer to HPI for ROS details.  Objective:   Vitals: BP (!) 152/85 (BP Location: Left Arm)   Pulse 75   Temp 98.3 F (36.8 C) (Oral)   Ht 5\' 4"  (1.626 m)   Wt 163 lb (73.9 kg)   SpO2 100%   BMI 27.98 kg/m   Physical Exam Vitals and nursing note reviewed.  Constitutional:      General: She is not in acute distress.    Appearance: She is well-developed. She is not ill-appearing or toxic-appearing.  HENT:     Head: Normocephalic and atraumatic.  Cardiovascular:     Rate and Rhythm: Normal rate.  Pulmonary:     Effort: Pulmonary effort is normal. No respiratory distress.  Abdominal:     General: There is no distension.     Palpations: Abdomen is soft.     Tenderness: There is abdominal tenderness. There is rebound. There is no right CVA tenderness, left CVA tenderness or guarding.  Musculoskeletal:     Lumbar back: Spasms and tenderness present. No swelling, signs of trauma or bony tenderness. Decreased range of motion.  Skin:    General: Skin is warm and dry.  Neurological:     General: No focal deficit present.     Mental Status: She is alert and oriented to person, place, and time.  Psychiatric:        Mood and Affect: Mood normal.        Behavior: Behavior normal.     Procedures  Results for orders placed or performed during the hospital encounter of 09/26/23 (from the past 24 hours)  Urinalysis, w/ Reflex to Culture (Infection Suspected) -Urine, Clean Catch     Status: Abnormal   Collection Time: 09/26/23 10:31 AM  Result Value Ref Range   Specimen Source URINE, CLEAN CATCH    Color, Urine YELLOW YELLOW   APPearance CLEAR CLEAR   Specific Gravity, Urine 1.010 1.005 - 1.030   pH 6.5 5.0 - 8.0   Glucose, UA  NEGATIVE NEGATIVE mg/dL   Hgb urine dipstick TRACE (A) NEGATIVE   Bilirubin Urine NEGATIVE NEGATIVE   Ketones, ur NEGATIVE NEGATIVE mg/dL   Protein, ur NEGATIVE NEGATIVE mg/dL   Nitrite NEGATIVE NEGATIVE   Leukocytes,Ua TRACE (A) NEGATIVE   Squamous Epithelial / HPF 6-10 0 - 5 /HPF   WBC, UA 0-5 0 - 5 WBC/hpf   RBC / HPF 0-5 0 - 5 RBC/hpf   Bacteria, UA RARE (A) NONE SEEN    Assessment and Plan :     Discharge Instructions       1. Acute cystitis with hematuria (Primary) - Urinalysis, w/ Reflex to  Culture (Infection Suspected) -complaining UC shows trace leukocytes, trace bacteria, trace blood, these findings are indicative of urinary tract infection - Urine Culture sent to lab for further testing results should be available in 2 to 3 days. - cephALEXin (KEFLEX) 500 MG capsule; Take 1 capsule (500 mg total) by mouth 3 (three) times daily for 5 days.  Dispense: 15 capsule; Refill: 0 - phenazopyridine (PYRIDIUM) 200 MG tablet; Take 1 tablet (200 mg total) by mouth 3 (three) times daily.  Dispense: 6 tablet; Refill: 0  2. Lumbar strain, initial encounter - baclofen (LIORESAL) 10 MG tablet; Take 1 tablet (10 mg total) by mouth 3 (three) times daily.  Dispense: 30 each; Refill: 0 - diclofenac (VOLTAREN) 50 MG EC tablet; Take 1 tablet (50 mg total) by mouth 2 (two) times daily.  Dispense: 30 tablet; Refill: 1 -Unfortunately kidney stone cannot be ruled out without CT scan of the abdomen or ultrasound of the kidney.  If symptoms continue to manifest follow-up in ER for evaluation and treatment. -Continue to monitor symptoms for any change in severity if there is any escalation of current symptoms or development of new symptoms follow-up in ER for further evaluation and management.    Carnelius Hammitt B Faria Casella   Anallely Rosell, [place] B, [place] 09/26/23 1109

## 2023-09-27 LAB — URINE CULTURE
Culture: 30000 — AB
Special Requests: NORMAL

## 2023-09-28 ENCOUNTER — Encounter: Payer: Self-pay | Admitting: Internal Medicine

## 2023-09-30 ENCOUNTER — Ambulatory Visit (HOSPITAL_COMMUNITY): Payer: Self-pay

## 2023-10-01 ENCOUNTER — Other Ambulatory Visit: Payer: Self-pay | Admitting: Internal Medicine

## 2023-10-01 DIAGNOSIS — F331 Major depressive disorder, recurrent, moderate: Secondary | ICD-10-CM

## 2023-10-01 NOTE — Telephone Encounter (Signed)
Please review patient's message:

## 2023-10-03 NOTE — Telephone Encounter (Signed)
 Requested medications are due for refill today.  No - too soon  Requested medications are on the active medications list.  yes  Last refill. 05/22/2023 #90 1 rf  Future visit scheduled.   yes  Notes to clinic.  Refill/refusal not delegated.    Requested Prescriptions  Pending Prescriptions Disp Refills   ARIPiprazole  (ABILIFY ) 2 MG tablet [Pharmacy Med Name: ARIPIPRAZOLE  2 MG TABLET] 90 tablet 1    Sig: TAKE 1 TABLET BY MOUTH EVERY DAY     Not Delegated - Psychiatry:  Antipsychotics - Second Generation (Atypical) - aripiprazole  Failed - 10/03/2023  2:49 PM      Failed - This refill cannot be delegated      Failed - Last BP in normal range    BP Readings from Last 1 Encounters:  09/26/23 (!) 152/85         Failed - Valid encounter within last 6 months    Recent Outpatient Visits           1 month ago Cough variant asthma   [place] [facility] at [facility], Chales [place], MD       Future Appointments             Tomorrow Sheron Dixons, MD [facility] at [facility], [facility]   In 8 months Larkin Plumb, Arleen Lacer, [place] [facility] at [facility], [facility]            Failed - Lipid Panel in normal range within the last 12 months    Cholesterol, Total  Date Value Ref Range Status  05/22/2023 239 (H) 100 - 199 mg/dL Final   LDL Chol Calc (NIH)  Date Value Ref Range Status  05/22/2023 157 (H) 0 - 99 mg/dL Final   HDL  Date Value Ref Range Status  05/22/2023 66 >39 mg/dL Final   Triglycerides  Date Value Ref Range Status  05/22/2023 92 0 - 149 mg/dL Final         Passed - TSH in normal range and within 360 days    TSH  Date Value Ref Range Status  05/22/2023 0.944 0.450 - 4.500 uIU/mL Final         Passed - Completed PHQ-2 or PHQ-9 in the last 360 days      Passed - Last Heart Rate in normal range    Pulse Readings from Last 1 Encounters:  09/26/23 75          Passed - CBC within normal limits and completed in the last 12 months    WBC  Date Value Ref Range Status  05/22/2023 4.7 3.4 - 10.8 x10E3/uL Final  01/13/2021 4.5 4.0 - 10.5 K/uL Final   RBC  Date Value Ref Range Status  05/22/2023 4.95 3.77 - 5.28 x10E6/uL Final  01/13/2021 4.60 3.87 - 5.11 MIL/uL Final   Hemoglobin  Date Value Ref Range Status  05/22/2023 14.2 11.1 - 15.9 g/dL Final   Hematocrit  Date Value Ref Range Status  05/22/2023 42.8 34.0 - 46.6 % Final   MCHC  Date Value Ref Range Status  05/22/2023 33.2 31.5 - 35.7 g/dL Final  16/02/9603 54.0 30.0 - 36.0 g/dL Final   [facility]  Date Value Ref Range Status  05/22/2023 28.7 26.6 - 33.0 pg Final  01/13/2021 29.3 26.0 - 34.0 pg Final   MCV  Date Value Ref Range Status  05/22/2023 87 79 - 97  fL Final  11/15/2012 82 80 - 100 fL Final   No results found for: "PLTCOUNTKUC", "LABPLAT", "POCPLA" RDW  Date Value Ref Range Status  05/22/2023 11.9 11.7 - 15.4 % Final  11/15/2012 12.3 11.5 - 14.5 % Final         Passed - CMP within normal limits and completed in the last 12 months    Albumin  Date Value Ref Range Status  05/22/2023 4.5 3.9 - 4.9 g/dL Final  16/02/9603 4.5 3.4 - 5.0 g/dL Final   Alkaline Phosphatase  Date Value Ref Range Status  05/22/2023 83 44 - 121 IU/L Final  11/15/2012 92 50 - 136 Unit/L Final   ALT  Date Value Ref Range Status  05/22/2023 9 0 - 32 IU/L Final   SGPT (ALT)  Date Value Ref Range Status  11/15/2012 18 12 - 78 U/L Final   AST  Date Value Ref Range Status  05/22/2023 13 0 - 40 IU/L Final   SGOT(AST)  Date Value Ref Range Status  11/15/2012 16 15 - 37 Unit/L Final   BUN  Date Value Ref Range Status  05/22/2023 16 8 - 27 mg/dL Final  54/01/8118 10 7 - 18 mg/dL Final   Calcium  Date Value Ref Range Status  05/22/2023 9.3 8.7 - 10.3 mg/dL Final   Calcium, Total  Date Value Ref Range Status  11/15/2012 9.5 8.5 - 10.1 mg/dL Final   CO2  Date Value Ref Range Status   05/22/2023 24 20 - 29 mmol/L Final   Co2  Date Value Ref Range Status  11/15/2012 29 21 - 32 mmol/L Final   Creatinine  Date Value Ref Range Status  11/15/2012 1.02 0.60 - 1.30 mg/dL Final   Creatinine, Ser  Date Value Ref Range Status  05/22/2023 0.94 0.57 - 1.00 mg/dL Final   Glucose  Date Value Ref Range Status  05/22/2023 94 70 - 99 mg/dL Final  14/78/2956 213 (H) 65 - 99 mg/dL Final   Potassium  Date Value Ref Range Status  05/22/2023 4.0 3.5 - 5.2 mmol/L Final  11/15/2012 3.5 3.5 - 5.1 mmol/L Final   Sodium  Date Value Ref Range Status  05/22/2023 143 134 - 144 mmol/L Final  11/15/2012 143 136 - 145 mmol/L Final   Bilirubin,Total  Date Value Ref Range Status  11/15/2012 1.8 (H) 0.2 - 1.0 mg/dL Final   Bilirubin Total  Date Value Ref Range Status  05/22/2023 0.9 0.0 - 1.2 mg/dL Final   Protein, ur  Date Value Ref Range Status  09/26/2023 NEGATIVE NEGATIVE mg/dL Final   Total Protein  Date Value Ref Range Status  05/22/2023 6.3 6.0 - 8.5 g/dL Final  08/65/7846 7.4 6.4 - 8.2 g/dL Final   EGFR (African American)  Date Value Ref Range Status  11/15/2012 >60  Final   GFR calc Af Amer  Date Value Ref Range Status  09/16/2019 81 >59 mL/min/1.73 Final    Comment:    **[facility] currently reports eGFR in compliance with the current**   recommendations of the [employer]. [facility] will   update reporting as new guidelines are published from the NKF-ASN   Task force.    eGFR  Date Value Ref Range Status  05/22/2023 67 >59 mL/min/1.73 Final   EGFR (Non-African Amer.)  Date Value Ref Range Status  11/15/2012 >60  Final    Comment:    eGFR values <50mL/min/1.73 m2 may be an indication of chronic kidney disease (CKD). Calculated eGFR is useful  in patients with stable renal function. The eGFR calculation will not be reliable in acutely ill patients when serum creatinine is changing rapidly. It is not useful in  patients on dialysis. The eGFR  calculation may not be applicable to patients at the low and high extremes of body sizes, pregnant women, and vegetarians.    GFR calc non Af Amer  Date Value Ref Range Status  09/16/2019 71 >59 mL/min/1.73 Final

## 2023-10-03 NOTE — Telephone Encounter (Signed)
Please review medication refill  request

## 2023-10-04 ENCOUNTER — Encounter: Payer: Self-pay | Admitting: Internal Medicine

## 2023-10-04 ENCOUNTER — Ambulatory Visit (INDEPENDENT_AMBULATORY_CARE_PROVIDER_SITE_OTHER): Admitting: Internal Medicine

## 2023-10-04 VITALS — BP 140/100 | HR 83 | Ht 64.0 in | Wt 169.4 lb

## 2023-10-04 DIAGNOSIS — J45991 Cough variant asthma: Secondary | ICD-10-CM | POA: Diagnosis not present

## 2023-10-04 DIAGNOSIS — M62830 Muscle spasm of back: Secondary | ICD-10-CM

## 2023-10-04 DIAGNOSIS — R300 Dysuria: Secondary | ICD-10-CM | POA: Diagnosis not present

## 2023-10-04 LAB — POCT URINALYSIS DIPSTICK
Bilirubin, UA: NEGATIVE
Blood, UA: NEGATIVE
Glucose, UA: NEGATIVE
Ketones, UA: NEGATIVE
Nitrite, UA: NEGATIVE
Protein, UA: NEGATIVE
Spec Grav, UA: 1.01 (ref 1.010–1.025)
Urobilinogen, UA: 0.2 U/dL
pH, UA: 6.5 (ref 5.0–8.0)

## 2023-10-04 MED ORDER — MONTELUKAST SODIUM 10 MG PO TABS: 10.0000 mg | ORAL_TABLET | Freq: Every day | ORAL | 0 refills | Status: DC

## 2023-10-04 MED ORDER — METHOCARBAMOL 750 MG PO TABS: 750.0000 mg | ORAL_TABLET | Freq: Every day | ORAL | 1 refills | Status: DC

## 2023-10-04 NOTE — Progress Notes (Signed)
 Date:  10/04/2023   Name:  Desiree Grant   DOB:  1955-06-06   MRN:  [id number]   Chief Complaint: Urinary Tract Infection (Burning, low abdominal pain, and urgency. Has been on a course of ABX last week but still having symptoms.)  Urinary Tract Infection  This is a new problem. The current episode started in the past 7 days. Pertinent negatives include no chills, hematuria or urgency.  Back Pain This is a chronic problem. The current episode started 1 to 4 weeks ago. The problem occurs constantly. The problem is unchanged. Associated symptoms include dysuria. Pertinent negatives include no chest pain, fever or headaches.  Cough This is a chronic problem. The problem has been unchanged. The problem occurs every few minutes. The cough is Non-productive. Pertinent negatives include no chest pain, chills, fever, headaches or wheezing. Her past medical history is significant for asthma (possible cough variant asthma).    Review of Systems  Constitutional:  Negative for chills, fatigue and fever.  HENT:  Negative for trouble swallowing.   Respiratory:  Positive for cough. Negative for chest tightness and wheezing.   Cardiovascular:  Negative for chest pain and palpitations.  Gastrointestinal:  Positive for constipation (relieved somewhat by laxative).  Genitourinary:  Positive for dysuria. Negative for hematuria and urgency.  Musculoskeletal:  Positive for back pain.  Neurological:  Negative for dizziness and headaches.  Psychiatric/Behavioral:  Negative for dysphoric mood and sleep disturbance. The patient is not nervous/anxious.      Lab Results  Component Value Date   NA 143 05/22/2023   K 4.0 05/22/2023   CO2 24 05/22/2023   GLUCOSE 94 05/22/2023   BUN 16 05/22/2023   CREATININE 0.94 05/22/2023   CALCIUM 9.3 05/22/2023   EGFR 67 05/22/2023   GFRNONAA 71 09/16/2019   Lab Results  Component Value Date   CHOL 239 (H) 05/22/2023   HDL 66 05/22/2023   LDLCALC 157 (H)  05/22/2023   TRIG 92 05/22/2023   CHOLHDL 3.6 05/22/2023   Lab Results  Component Value Date   TSH 0.944 05/22/2023   Lab Results  Component Value Date   HGBA1C 5.2 09/25/2021   Lab Results  Component Value Date   WBC 4.7 05/22/2023   HGB 14.2 05/22/2023   HCT 42.8 05/22/2023   MCV 87 05/22/2023   PLT 205 05/22/2023   Lab Results  Component Value Date   ALT 9 05/22/2023   AST 13 05/22/2023   ALKPHOS 83 05/22/2023   BILITOT 0.9 05/22/2023   No results found for: "25OHVITD2", "25OHVITD3", "VD25OH"   Patient Active Problem List   Diagnosis Date Noted   Cervical adenopathy 03/21/2021   Chronic rhinitis 02/24/2021   Cough variant asthma with component of UACS  01/13/2021   Lipoma of right lower extremity 03/08/2020   Lumbar degenerative disc disease 11/02/2019   Gastroesophageal reflux disease 09/10/2018   Neoplasm of uncertain behavior of skin 09/13/2016   Muscle cramp, nocturnal 09/13/2016   Adenomatous colon polyp 02/07/2015   Hyperlipidemia, mild 02/06/2015   Fibrocystic breast changes 10/26/2014   Depression, major, recurrent, moderate (HCC) 10/26/2014    Allergies  Allergen Reactions   Niacin And Related Hives    blisters    Past Surgical History:  Procedure Laterality Date   ANTERIOR AND POSTERIOR REPAIR N/A 09/24/2022   Procedure: ANTERIOR (CYSTOCELE) AND POSTERIOR REPAIR (RECTOCELE);  Surgeon: Zenobia Hila, MD;  Location: [facility];  Service: Gynecology;  Laterality: N/A;   BLADDER SUSPENSION N/A  09/24/2022   Procedure: TOT;  Surgeon: Zenobia Hila, MD;  Location: [facility];  Service: Gynecology;  Laterality: N/A;   CATARACT EXTRACTION W/PHACO Right 09/02/2017   Procedure: CATARACT EXTRACTION PHACO AND INTRAOCULAR LENS PLACEMENT (IOC) RIGHT SYMFONY TORIC;  Surgeon: Annell Kidney, MD;  Location: [facility];  Service: Ophthalmology;  Laterality: Right;   CATARACT EXTRACTION W/PHACO Left 10/30/2017   Procedure: CATARACT EXTRACTION  PHACO AND INTRAOCULAR LENS PLACEMENT (IOC)  LEFT SYMFONY LENS;  Surgeon: Annell Kidney, MD;  Location: [facility];  Service: Ophthalmology;  Laterality: Left;   COLONOSCOPY W/ POLYPECTOMY  2012   tubular adenoma 0.5cm was removed from descending colon   COSMETIC SURGERY     FACIAL COSMETIC SURGERY  2015   PARTIAL HYSTERECTOMY     bleeding; cervix and ovary remains    Social History   Tobacco Use   Smoking status: Never   Smokeless tobacco: Never  Vaping Use   Vaping status: Never Used  Substance Use Topics   Alcohol use: Never   Drug use: No     Medication list has been reviewed and updated.  Current Meds  Medication Sig   ARIPiprazole  (ABILIFY ) 2 MG tablet TAKE 1 TABLET BY MOUTH EVERY DAY   buPROPion  ER (WELLBUTRIN  SR) 100 MG 12 hr tablet Take 1 tablet (100 mg total) by mouth every morning.   diclofenac (VOLTAREN) 50 MG EC tablet Take 1 tablet (50 mg total) by mouth 2 (two) times daily.   montelukast (SINGULAIR) 10 MG tablet Take 1 tablet (10 mg total) by mouth at bedtime.   venlafaxine  XR (EFFEXOR -XR) 150 MG 24 hr capsule Take 1 capsule (150 mg total) by mouth every morning.   [DISCONTINUED] baclofen (LIORESAL) 10 MG tablet Take 1 tablet (10 mg total) by mouth 3 (three) times daily.       08/28/2023    2:04 PM 08/28/2023    2:01 PM 05/22/2023   10:57 AM 12/19/2022   11:24 AM  GAD 7 : Generalized Anxiety Score  Nervous, Anxious, on Edge 0 0 2 1  Control/stop worrying 0 0 2 3  Worry too much - different things 0  2 3  Trouble relaxing 0  2 0  Restless 0  2 0  Easily annoyed or irritable 0  3 1  Afraid - awful might happen 0  3 3  Total GAD 7 Score 0  16 11  Anxiety Difficulty Not difficult at all  Extremely difficult Somewhat difficult       10/04/2023    2:49 PM 08/28/2023    2:05 PM 08/28/2023    2:01 PM  Depression screen PHQ 2/9  Decreased Interest 0 0 0  Down, Depressed, Hopeless 0 0 0  PHQ - 2 Score 0 0 0  Altered sleeping  0   Tired,  decreased energy  0   Change in appetite  0   Feeling bad or failure about yourself   0   Trouble concentrating  0   Moving slowly or fidgety/restless  0   Suicidal thoughts  0   PHQ-9 Score  0   Difficult doing work/chores  Not difficult at all     BP Readings from Last 3 Encounters:  10/04/23 (!) 140/100  09/26/23 (!) 152/85  08/28/23 124/82    Physical Exam Vitals and nursing note reviewed.  Constitutional:      General: She is not in acute distress.    Appearance: Normal appearance. She is well-developed.  HENT:  Head: Normocephalic and atraumatic.  Cardiovascular:     Rate and Rhythm: Normal rate and regular rhythm.  Pulmonary:     Effort: Pulmonary effort is normal. No respiratory distress.  Abdominal:     Palpations: Abdomen is soft.     Tenderness: There is abdominal tenderness in the left lower quadrant. There is no right CVA tenderness, left CVA tenderness, guarding or rebound.  Musculoskeletal:     Cervical back: Normal range of motion.     Thoracic back: Spasms present.       Back:  Lymphadenopathy:     Cervical: No cervical adenopathy.  Skin:    General: Skin is warm and dry.     Findings: No rash.  Neurological:     Mental Status: She is alert and oriented to person, place, and time.  Psychiatric:        Mood and Affect: Mood normal.        Behavior: Behavior normal.     Wt Readings from Last 3 Encounters:  10/04/23 169 lb 6.4 oz (76.8 kg)  09/26/23 163 lb (73.9 kg)  08/28/23 166 lb (75.3 kg)    BP (!) 140/100   Pulse 83   Ht 5\' 4"  (1.626 m)   Wt 169 lb 6.4 oz (76.8 kg)   SpO2 97%   BMI 29.08 kg/m   Assessment and Plan:  Problem List Items Addressed This Visit       Unprioritized   Cough variant asthma with component of UACS  (Chronic)   no response to Famotidine  Rec trial of Montelukast       Relevant Medications   montelukast (SINGULAIR) 10 MG tablet   Other Visit Diagnoses       Dysuria    -  Primary   completed Keflex  with persistent symptoms will repeat culture push fluids   Relevant Orders   Urine Culture   POCT urinalysis dipstick (Completed)     Back muscle spasm       recommend Robaxin  and heat   Relevant Medications   methocarbamol  (ROBAXIN -750) 750 MG tablet       No follow-ups on file.    Sheron Dixons, MD [facility] Medicine [place]

## 2023-10-04 NOTE — Assessment & Plan Note (Signed)
 no response to Famotidine  Rec trial of Montelukast

## 2023-10-06 ENCOUNTER — Ambulatory Visit: Payer: Self-pay | Admitting: Internal Medicine

## 2023-10-06 LAB — URINE CULTURE

## 2023-10-16 ENCOUNTER — Encounter: Payer: Self-pay | Admitting: Internal Medicine

## 2023-10-16 NOTE — Telephone Encounter (Signed)
Please review patient's message:

## 2023-10-27 ENCOUNTER — Other Ambulatory Visit: Payer: Self-pay | Admitting: Internal Medicine

## 2023-10-27 DIAGNOSIS — J45991 Cough variant asthma: Secondary | ICD-10-CM

## 2023-10-29 NOTE — Telephone Encounter (Signed)
 Requested Prescriptions  Pending Prescriptions Disp Refills   montelukast  (SINGULAIR ) 10 MG tablet [Pharmacy Med Name: MONTELUKAST  SOD 10 MG TABLET] 90 tablet 1    Sig: TAKE 1 TABLET BY MOUTH EVERYDAY AT BEDTIME     Pulmonology:  Leukotriene Inhibitors Passed - 10/29/2023  1:42 PM      Passed - Valid encounter within last 12 months    Recent Outpatient Visits           3 weeks ago Dysuria   [facility] at [facility], Leita DEL, MD   2 months ago Cough variant asthma   [place] [facility] at [facility], Leita DEL, MD       Future Appointments             In 7 months Waddell, Toribio SQUIBB, PA [facility] Medicine at [facility], [facility]

## 2023-12-01 ENCOUNTER — Other Ambulatory Visit: Payer: Self-pay | Admitting: Internal Medicine

## 2023-12-01 DIAGNOSIS — M62830 Muscle spasm of back: Secondary | ICD-10-CM

## 2023-12-02 NOTE — Telephone Encounter (Signed)
 Requested medications are due for refill today.  yes  Requested medications are on the active medications list.  yes  Last refill. 10/04/2023 #30 1 rf  Future visit scheduled.   yes  Notes to clinic.  Refill not delegated.    Requested Prescriptions  Pending Prescriptions Disp Refills   methocarbamol  (ROBAXIN ) 750 MG tablet [Pharmacy Med Name: METHOCARBAMOL  750 MG TABLET] 30 tablet 1    Sig: TAKE 1 TABLET (750 MG TOTAL) BY MOUTH EVERY DAY AT BEDTIME     Not Delegated - Analgesics:  Muscle Relaxants Failed - 12/02/2023  5:27 PM      Failed - This refill cannot be delegated      Passed - Valid encounter within last 6 months    Recent Outpatient Visits           1 month ago Dysuria   [place] [facility] at [facility], Leita DEL, MD   3 months ago Cough variant asthma   [place] [facility] at [facility], Leita DEL, MD       Future Appointments             In 6 months Waddell, Toribio SQUIBB, PA [facility] Primary Care & Sports Medicine at [facility], [facility]

## 2023-12-09 ENCOUNTER — Encounter: Payer: Self-pay | Admitting: Internal Medicine

## 2023-12-09 ENCOUNTER — Encounter: Payer: Self-pay | Admitting: Family Medicine

## 2023-12-09 ENCOUNTER — Ambulatory Visit (INDEPENDENT_AMBULATORY_CARE_PROVIDER_SITE_OTHER): Admitting: Family Medicine

## 2023-12-09 VITALS — BP 108/76 | HR 100 | Ht 64.0 in | Wt 158.8 lb

## 2023-12-09 DIAGNOSIS — K649 Unspecified hemorrhoids: Secondary | ICD-10-CM | POA: Insufficient documentation

## 2023-12-09 MED ORDER — HYDROCORTISONE ACETATE 25 MG RE SUPP: 25.0000 mg | Freq: Two times a day (BID) | RECTAL | 1 refills | Status: DC | PRN

## 2023-12-09 NOTE — Progress Notes (Signed)
     Primary Care / Sports Medicine Office Visit  Patient Information:  Patient ID: Desiree Grant, female DOB: 08-05-1955 Age: 68 y.o. MRN: [id number]   Desiree Grant is a pleasant 68 y.o. female presenting with the following:  Chief Complaint  Patient presents with   Rectal Pain    Patient having rectum pain from hemorrhoids and constipation per patient. She would like rectal suppositories to help with hemorrhoid pain.    Vitals:   12/09/23 1604  BP: 108/76  Pulse: 100  SpO2: 99%   Vitals:   12/09/23 1604  Weight: 158 lb 12.8 oz (72 kg)  Height: 5' 4 (1.626 m)   Body mass index is 27.26 kg/m.  No results found.   Independent interpretation of notes and tests performed by another provider:   None  Procedures performed:   None  Pertinent History, Exam, Impression, and Recommendations:   Problem List Items Addressed This Visit     Hemorrhoids - Primary   History of Present Illness Desiree Grant is a 68 year old female with a history of rectal prolapse who presents with a hemorrhoid flare-up in the setting of constipation.  Hemorrhoidal flare and anal swelling - Acute on chronic flare of hemorrhoids with onset after no bowel movement since last Tuesday - Significant anal swelling and pain - Hemorrhoids prolapse with straining, described as 'popping out' - Swelling primarily internal, with difficulty inserting enema due to rectal swelling - Pain associated with hemorrhoidal swelling - Uncertain presence of blood in stool; any blood seen was 'very minute' and possibly red  Constipation and bowel dysfunction - No bowel movement since last Tuesday prior to presentation - Failed attempts at relief with Dulcolax (taken the previous night) and enema (unable to insert due to swelling) - Some stool passed with straining, described as a 'great big pad' that would not pass through the anal opening - No current use of over-the-counter or prescription medications for  constipation - Previous use of a suppository was effective in reducing swelling and pain  Gastrointestinal symptoms - No abdominal pain or nausea - Indigestion present  Assessment and Plan Hemorrhoids with constipation Acute on chronic hemorrhoids exacerbated by constipation and straining. Constipation likely due to inadequate bowel regimen and post-surgical anatomical changes. - Prescribed steroid suppositories for hemorrhoid swelling and irritation. - Sent bowel regimen educational materials via MyChart. - Prescribed Zofran  for nausea if needed. - Instructed to contact if suppositories cannot be retained, for possible switch to topical cream. Contact us  for information.        Orders & Medications Medications:  Meds ordered this encounter  Medications   hydrocortisone  (ANUSOL -HC) 25 MG suppository    Sig: Place 1 suppository (25 mg total) rectally 2 (two) times daily as needed for hemorrhoids. X 7-14 days    Dispense:  24 suppository    Refill:  1   No orders of the defined types were placed in this encounter.    No follow-ups on file.     Selinda JINNY Ku, MD, [facility]   Primary Care Sports Medicine Primary Care and Sports Medicine at [facility] [place]

## 2023-12-09 NOTE — Patient Instructions (Signed)
 Patient Plan for Post-Visit Guidance  Hemorrhoids with Constipation  - Use the prescribed steroid suppositories for hemorrhoid swelling and irritation. - Follow the bowel regimen instructions provided via MyChart to help prevent constipation. - Take Zofran  for nausea if needed. - Contact the office if you are unable to retain the suppositories, as a topical cream may be needed. - Report any ongoing or worsening symptoms.  Red Flags: - If you experience severe pain, heavy rectal bleeding, fever, inability to pass stool, or new or worsening swelling, contact the office immediately.

## 2023-12-09 NOTE — Assessment & Plan Note (Signed)
 History of Present Illness Desiree Grant is a 68 year old female with a history of rectal prolapse who presents with a hemorrhoid flare-up in the setting of constipation.  Hemorrhoidal flare and anal swelling - Acute on chronic flare of hemorrhoids with onset after no bowel movement since last Tuesday - Significant anal swelling and pain - Hemorrhoids prolapse with straining, described as 'popping out' - Swelling primarily internal, with difficulty inserting enema due to rectal swelling - Pain associated with hemorrhoidal swelling - Uncertain presence of blood in stool; any blood seen was 'very minute' and possibly red  Constipation and bowel dysfunction - No bowel movement since last Tuesday prior to presentation - Failed attempts at relief with Dulcolax (taken the previous night) and enema (unable to insert due to swelling) - Some stool passed with straining, described as a 'great big pad' that would not pass through the anal opening - No current use of over-the-counter or prescription medications for constipation - Previous use of a suppository was effective in reducing swelling and pain  Gastrointestinal symptoms - No abdominal pain or nausea - Indigestion present  Assessment and Plan Hemorrhoids with constipation Acute on chronic hemorrhoids exacerbated by constipation and straining. Constipation likely due to inadequate bowel regimen and post-surgical anatomical changes. - Prescribed steroid suppositories for hemorrhoid swelling and irritation. - Sent bowel regimen educational materials via MyChart. - Prescribed Zofran  for nausea if needed. - Instructed to contact if suppositories cannot be retained, for possible switch to topical cream. Contact us  for information.

## 2023-12-10 DIAGNOSIS — K080 Exfoliation of teeth due to systemic causes: Secondary | ICD-10-CM | POA: Diagnosis not present

## 2023-12-16 ENCOUNTER — Encounter: Payer: Self-pay | Admitting: Internal Medicine

## 2023-12-17 NOTE — Telephone Encounter (Signed)
 Please review and advise.  JM

## 2023-12-20 ENCOUNTER — Ambulatory Visit
Admission: RE | Admit: 2023-12-20 | Discharge: 2023-12-20 | Disposition: A | Discharge status: Home / Self Care | Attending: Internal Medicine | Admitting: Internal Medicine

## 2023-12-20 ENCOUNTER — Ambulatory Visit (INDEPENDENT_AMBULATORY_CARE_PROVIDER_SITE_OTHER): Admitting: Internal Medicine

## 2023-12-20 ENCOUNTER — Ambulatory Visit
Admission: RE | Admit: 2023-12-20 | Discharge: 2023-12-20 | Disposition: A | Discharge status: Home / Self Care | Source: Ambulatory Visit | Attending: Internal Medicine | Admitting: Internal Medicine

## 2023-12-20 ENCOUNTER — Encounter: Payer: Self-pay | Admitting: Internal Medicine

## 2023-12-20 VITALS — BP 118/76 | HR 96 | Ht 64.0 in | Wt 157.0 lb

## 2023-12-20 DIAGNOSIS — K6289 Other specified diseases of anus and rectum: Secondary | ICD-10-CM

## 2023-12-20 DIAGNOSIS — K5900 Constipation, unspecified: Secondary | ICD-10-CM | POA: Diagnosis not present

## 2023-12-20 DIAGNOSIS — K5903 Drug induced constipation: Secondary | ICD-10-CM

## 2023-12-20 MED ORDER — NITROGLYCERIN 0.4 % RE OINT: TOPICAL_OINTMENT | RECTAL | 0 refills | Status: DC

## 2023-12-20 NOTE — Progress Notes (Signed)
 Date:  12/20/2023   Name:  Desiree Grant   DOB:  February 10, 1956   MRN:  [id number]   Chief Complaint: Rectal Pain  Constipation This is a new problem. The current episode started 1 to 4 weeks ago. The problem is unchanged. The patient is on a high fiber diet. She Exercises regularly. There has Been adequate water intake. Associated symptoms include bloating, diarrhea and rectal pain. Pertinent negatives include no fever. Associated symptoms comments: Started with symptoms of constipation and rectal pain. Risk factors: started [facility] again several weeks ago. She has tried manual disimpaction, stool softeners and laxatives for the symptoms. The treatment provided moderate (less rectal pain) relief.    Review of Systems  Constitutional:  Negative for chills, fatigue, fever and unexpected weight change.  Respiratory:  Negative for chest tightness and shortness of breath.   Cardiovascular:  Negative for chest pain and palpitations.  Gastrointestinal:  Positive for abdominal distention, bloating, constipation, diarrhea and rectal pain. Negative for anal bleeding and blood in stool.  Psychiatric/Behavioral:  Negative for dysphoric mood. The patient is not nervous/anxious.      Lab Results  Component Value Date   NA 143 05/22/2023   K 4.0 05/22/2023   CO2 24 05/22/2023   GLUCOSE 94 05/22/2023   BUN 16 05/22/2023   CREATININE 0.94 05/22/2023   CALCIUM 9.3 05/22/2023   EGFR 67 05/22/2023   GFRNONAA 71 09/16/2019   Lab Results  Component Value Date   CHOL 239 (H) 05/22/2023   HDL 66 05/22/2023   LDLCALC 157 (H) 05/22/2023   TRIG 92 05/22/2023   CHOLHDL 3.6 05/22/2023   Lab Results  Component Value Date   TSH 0.944 05/22/2023   Lab Results  Component Value Date   HGBA1C 5.2 09/25/2021   Lab Results  Component Value Date   WBC 4.7 05/22/2023   HGB 14.2 05/22/2023   HCT 42.8 05/22/2023   MCV 87 05/22/2023   PLT 205 05/22/2023   Lab Results  Component Value Date   ALT 9  05/22/2023   AST 13 05/22/2023   ALKPHOS 83 05/22/2023   BILITOT 0.9 05/22/2023   No results found for: MARIEN BOLLS, VD25OH   Patient Active Problem List   Diagnosis Date Noted   Hemorrhoids 12/09/2023   Cervical adenopathy 03/21/2021   Chronic rhinitis 02/24/2021   Cough variant asthma with component of UACS  01/13/2021   Lipoma of right lower extremity 03/08/2020   Lumbar degenerative disc disease 11/02/2019   Gastroesophageal reflux disease 09/10/2018   Neoplasm of uncertain behavior of skin 09/13/2016   Muscle cramp, nocturnal 09/13/2016   Adenomatous colon polyp 02/07/2015   Hyperlipidemia, mild 02/06/2015   Fibrocystic breast changes 10/26/2014   Depression, major, recurrent, moderate (HCC) 10/26/2014    Allergies  Allergen Reactions   Niacin And Related Hives    blisters    Past Surgical History:  Procedure Laterality Date   ANTERIOR AND POSTERIOR REPAIR N/A 09/24/2022   Procedure: ANTERIOR (CYSTOCELE) AND POSTERIOR REPAIR (RECTOCELE);  Surgeon: Janit Alm Agent, MD;  Location: [facility];  Service: Gynecology;  Laterality: N/A;   BLADDER SUSPENSION N/A 09/24/2022   Procedure: TOT;  Surgeon: Janit Alm Agent, MD;  Location: [facility];  Service: Gynecology;  Laterality: N/A;   CATARACT EXTRACTION W/PHACO Right 09/02/2017   Procedure: CATARACT EXTRACTION PHACO AND INTRAOCULAR LENS PLACEMENT (IOC) RIGHT SYMFONY TORIC;  Surgeon: Mittie Gaskin, MD;  Location: [facility];  Service: Ophthalmology;  Laterality: Right;   CATARACT  EXTRACTION W/PHACO Left 10/30/2017   Procedure: CATARACT EXTRACTION PHACO AND INTRAOCULAR LENS PLACEMENT (IOC)  LEFT SYMFONY LENS;  Surgeon: Mittie Gaskin, MD;  Location: [facility];  Service: Ophthalmology;  Laterality: Left;   COLONOSCOPY W/ POLYPECTOMY  2012   tubular adenoma 0.5cm was removed from descending colon   COSMETIC SURGERY     FACIAL COSMETIC SURGERY  2015   PARTIAL HYSTERECTOMY      bleeding; cervix and ovary remains    Social History   Tobacco Use   Smoking status: Never   Smokeless tobacco: Never  Vaping Use   Vaping status: Never Used  Substance Use Topics   Alcohol use: Never   Drug use: No     Medication list has been reviewed and updated.  Current Meds  Medication Sig   ARIPiprazole  (ABILIFY ) 2 MG tablet TAKE 1 TABLET BY MOUTH EVERY DAY   buPROPion  ER (WELLBUTRIN  SR) 100 MG 12 hr tablet Take 1 tablet (100 mg total) by mouth every morning.   diclofenac  (VOLTAREN ) 50 MG EC tablet Take 1 tablet (50 mg total) by mouth 2 (two) times daily.   hydrocortisone  (ANUSOL -HC) 25 MG suppository Place 1 suppository (25 mg total) rectally 2 (two) times daily as needed for hemorrhoids. X 7-14 days   methocarbamol  (ROBAXIN ) 750 MG tablet TAKE 1 TABLET (750 MG TOTAL) BY MOUTH EVERY DAY AT BEDTIME   montelukast  (SINGULAIR ) 10 MG tablet TAKE 1 TABLET BY MOUTH EVERYDAY AT BEDTIME   Nitroglycerin  0.4 % OINT Apply 1 inch (375 mg) ointment intra-anally every 12 hours as needed   venlafaxine  XR (EFFEXOR -XR) 150 MG 24 hr capsule Take 1 capsule (150 mg total) by mouth every morning.       12/20/2023   11:16 AM 08/28/2023    2:04 PM 08/28/2023    2:01 PM 05/22/2023   10:57 AM  GAD 7 : Generalized Anxiety Score  Nervous, Anxious, on Edge 0 0 0 2  Control/stop worrying 0 0 0 2  Worry too much - different things 0 0  2  Trouble relaxing 0 0  2  Restless 0 0  2  Easily annoyed or irritable 0 0  3  Afraid - awful might happen 0 0  3  Total GAD 7 Score 0 0  16  Anxiety Difficulty Not difficult at all Not difficult at all  Extremely difficult       12/20/2023   11:16 AM 12/09/2023    4:11 PM 10/04/2023    2:49 PM  Depression screen PHQ 2/9  Decreased Interest 0 0 0  Down, Depressed, Hopeless 0 0 0  PHQ - 2 Score 0 0 0  Altered sleeping 2 2   Tired, decreased energy 3 3   Change in appetite 0 0   Feeling bad or failure about yourself  0 0   Trouble concentrating 0 0    Moving slowly or fidgety/restless 0 0   Suicidal thoughts 0 0   PHQ-9 Score 5 5   Difficult doing work/chores Not difficult at all Not difficult at all     BP Readings from Last 3 Encounters:  12/20/23 118/76  12/09/23 108/76  10/04/23 (!) 140/100    Physical Exam Vitals and nursing note reviewed.  Constitutional:      General: She is not in acute distress.    Appearance: Normal appearance. She is well-developed.  HENT:     Head: Normocephalic and atraumatic.  Cardiovascular:     Rate and Rhythm: Normal rate  and regular rhythm.  Pulmonary:     Effort: Pulmonary effort is normal. No respiratory distress.     Breath sounds: No wheezing or rhonchi.  Abdominal:     General: Abdomen is flat. Bowel sounds are normal.     Palpations: Abdomen is soft.     Tenderness: There is abdominal tenderness in the left lower quadrant. There is no guarding or rebound.     Comments: Rectal - two skin tags; internal - tender, no mass, no stool, no stricture Stool brown and hemoccult negative  Genitourinary:    Rectum: Guaiac result negative. Tenderness present. No mass, anal fissure or internal hemorrhoid. Normal anal tone.  Skin:    General: Skin is warm and dry.     Findings: No rash.  Neurological:     Mental Status: She is alert and oriented to person, place, and time.  Psychiatric:        Mood and Affect: Mood normal.        Behavior: Behavior normal.     Wt Readings from Last 3 Encounters:  12/20/23 157 lb (71.2 kg)  12/09/23 158 lb 12.8 oz (72 kg)  10/04/23 169 lb 6.4 oz (76.8 kg)    BP 118/76   Pulse 96   Ht 5' 4 (1.626 m)   Wt 157 lb (71.2 kg)   SpO2 99%   BMI 26.95 kg/m   Assessment and Plan:  Problem List Items Addressed This Visit   None Visit Diagnoses       Drug-induced constipation    -  Primary   suspect this was triggered by [facility] KUB negative for obstruction/high stool burden stop Dulcolax and use only Miralax every other day if taking Wegovy    Relevant Orders   DG Abd 1 View (Completed)     Rectal pain       will try NTG rectal ointment for apparent spasm   Relevant Medications   Nitroglycerin  0.4 % OINT      Follow up if no improvement.  No follow-ups on file.    Leita HILARIO Adie, MD [facility] [place]

## 2023-12-21 ENCOUNTER — Encounter: Payer: Self-pay | Admitting: Internal Medicine

## 2023-12-22 ENCOUNTER — Encounter: Payer: Self-pay | Admitting: Internal Medicine

## 2024-02-21 ENCOUNTER — Encounter: Payer: Self-pay | Admitting: Internal Medicine

## 2024-02-24 ENCOUNTER — Other Ambulatory Visit: Payer: Self-pay | Admitting: Internal Medicine

## 2024-02-24 NOTE — Telephone Encounter (Signed)
 Please review and advise.  JM

## 2024-02-24 NOTE — Progress Notes (Unsigned)
 Date:  02/24/2024   Name:  Desiree Grant   DOB:  1955/07/19   MRN:  [id number]   Chief Complaint: No chief complaint on file.  HPI  Review of Systems   Lab Results  Component Value Date   NA 143 05/22/2023   K 4.0 05/22/2023   CO2 24 05/22/2023   GLUCOSE 94 05/22/2023   BUN 16 05/22/2023   CREATININE 0.94 05/22/2023   CALCIUM 9.3 05/22/2023   EGFR 67 05/22/2023   GFRNONAA 71 09/16/2019   Lab Results  Component Value Date   CHOL 239 (H) 05/22/2023   HDL 66 05/22/2023   LDLCALC 157 (H) 05/22/2023   TRIG 92 05/22/2023   CHOLHDL 3.6 05/22/2023   Lab Results  Component Value Date   TSH 0.944 05/22/2023   Lab Results  Component Value Date   HGBA1C 5.2 09/25/2021   Lab Results  Component Value Date   WBC 4.7 05/22/2023   HGB 14.2 05/22/2023   HCT 42.8 05/22/2023   MCV 87 05/22/2023   PLT 205 05/22/2023   Lab Results  Component Value Date   ALT 9 05/22/2023   AST 13 05/22/2023   ALKPHOS 83 05/22/2023   BILITOT 0.9 05/22/2023   No results found for: MARIEN BOLLS, VD25OH   Patient Active Problem List   Diagnosis Date Noted   Hemorrhoids 12/09/2023   Cervical adenopathy 03/21/2021   Chronic rhinitis 02/24/2021   Cough variant asthma with component of UACS  01/13/2021   Lipoma of right lower extremity 03/08/2020   Lumbar degenerative disc disease 11/02/2019   Gastroesophageal reflux disease 09/10/2018   Neoplasm of uncertain behavior of skin 09/13/2016   Muscle cramp, nocturnal 09/13/2016   Adenomatous colon polyp 02/07/2015   Hyperlipidemia, mild 02/06/2015   Fibrocystic breast changes 10/26/2014   Depression, major, recurrent, moderate (HCC) 10/26/2014    Allergies  Allergen Reactions   Niacin And Related Hives    blisters    Past Surgical History:  Procedure Laterality Date   ANTERIOR AND POSTERIOR REPAIR N/A 09/24/2022   Procedure: ANTERIOR (CYSTOCELE) AND POSTERIOR REPAIR (RECTOCELE);  Surgeon: Janit Alm Agent, MD;   Location: [facility];  Service: Gynecology;  Laterality: N/A;   BLADDER SUSPENSION N/A 09/24/2022   Procedure: TOT;  Surgeon: Janit Alm Agent, MD;  Location: [facility];  Service: Gynecology;  Laterality: N/A;   CATARACT EXTRACTION W/PHACO Right 09/02/2017   Procedure: CATARACT EXTRACTION PHACO AND INTRAOCULAR LENS PLACEMENT (IOC) RIGHT SYMFONY TORIC;  Surgeon: Mittie Gaskin, MD;  Location: [facility];  Service: Ophthalmology;  Laterality: Right;   CATARACT EXTRACTION W/PHACO Left 10/30/2017   Procedure: CATARACT EXTRACTION PHACO AND INTRAOCULAR LENS PLACEMENT (IOC)  LEFT SYMFONY LENS;  Surgeon: Mittie Gaskin, MD;  Location: [facility];  Service: Ophthalmology;  Laterality: Left;   COLONOSCOPY W/ POLYPECTOMY  2012   tubular adenoma 0.5cm was removed from descending colon   COSMETIC SURGERY     FACIAL COSMETIC SURGERY  2015   PARTIAL HYSTERECTOMY     bleeding; cervix and ovary remains    Social History   Tobacco Use   Smoking status: Never   Smokeless tobacco: Never  Vaping Use   Vaping status: Never Used  Substance Use Topics   Alcohol use: Never   Drug use: No     Medication list has been reviewed and updated.  No outpatient medications have been marked as taking for the 02/24/24 encounter (Orders Only) with Justus Leita DEL, MD.  12/20/2023   11:16 AM 08/28/2023    2:04 PM 08/28/2023    2:01 PM 05/22/2023   10:57 AM  GAD 7 : Generalized Anxiety Score  Nervous, Anxious, on Edge 0 0 0 2  Control/stop worrying 0 0 0 2  Worry too much - different things 0 0  2  Trouble relaxing 0 0  2  Restless 0 0  2  Easily annoyed or irritable 0 0  3  Afraid - awful might happen 0 0  3  Total GAD 7 Score 0 0  16  Anxiety Difficulty Not difficult at all Not difficult at all  Extremely difficult       12/20/2023   11:16 AM 12/09/2023    4:11 PM 10/04/2023    2:49 PM  Depression screen PHQ 2/9  Decreased Interest 0 0 0  Down, Depressed, Hopeless 0 0  0  PHQ - 2 Score 0 0 0  Altered sleeping 2 2   Tired, decreased energy 3 3   Change in appetite 0 0   Feeling bad or failure about yourself  0 0   Trouble concentrating 0 0   Moving slowly or fidgety/restless 0 0   Suicidal thoughts 0 0   PHQ-9 Score 5 5   Difficult doing work/chores Not difficult at all Not difficult at all     BP Readings from Last 3 Encounters:  12/20/23 118/76  12/09/23 108/76  10/04/23 (!) 140/100    Physical Exam  Wt Readings from Last 3 Encounters:  12/20/23 157 lb (71.2 kg)  12/09/23 158 lb 12.8 oz (72 kg)  10/04/23 169 lb 6.4 oz (76.8 kg)    There were no vitals taken for this visit.  Assessment and Plan:  Problem List Items Addressed This Visit   None   No follow-ups on file.    Leita HILARIO Adie, MD [facility] [place]

## 2024-03-04 ENCOUNTER — Ambulatory Visit (INDEPENDENT_AMBULATORY_CARE_PROVIDER_SITE_OTHER): Admitting: Internal Medicine

## 2024-03-04 VITALS — BP 124/70 | HR 92 | Temp 98.3°F | Ht 64.0 in | Wt 158.0 lb

## 2024-03-04 DIAGNOSIS — J40 Bronchitis, not specified as acute or chronic: Secondary | ICD-10-CM

## 2024-03-04 DIAGNOSIS — J45901 Unspecified asthma with (acute) exacerbation: Secondary | ICD-10-CM

## 2024-03-04 MED ORDER — AZITHROMYCIN 250 MG PO TABS: ORAL_TABLET | ORAL | 0 refills | Status: AC

## 2024-03-04 MED ORDER — PROMETHAZINE-DM 6.25-15 MG/5ML PO SYRP: 5.0000 mL | ORAL_SOLUTION | Freq: Four times a day (QID) | ORAL | 0 refills | Status: DC | PRN

## 2024-03-04 MED ORDER — PREDNISONE 10 MG PO TABS: ORAL_TABLET | ORAL | 0 refills | Status: AC

## 2024-03-04 MED ORDER — ALBUTEROL SULFATE HFA 108 (90 BASE) MCG/ACT IN AERS: 2.0000 | INHALATION_SPRAY | Freq: Four times a day (QID) | RESPIRATORY_TRACT | 0 refills | Status: DC | PRN

## 2024-03-04 NOTE — Telephone Encounter (Signed)
 Please review.  KP

## 2024-03-04 NOTE — Progress Notes (Signed)
 Date:  03/04/2024   Name:  Desiree Grant   DOB:  03/24/1956   MRN:  [id number]   Chief Complaint: Cough (4 weeks- Fever in the beginning. Cough- mostly non productive. SOB.)  Cough This is a new problem. Episode onset: 5 weeks ago. The problem has been unchanged. The problem occurs every few minutes. The cough is Non-productive (occasional thick phlegm). Associated symptoms include ear congestion, headaches, postnasal drip, shortness of breath and wheezing. Pertinent negatives include no chest pain, chills, ear pain or fever. She has tried OTC cough suppressant for the symptoms. The treatment provided no relief. Her past medical history is significant for asthma.    Review of Systems  Constitutional:  Positive for fatigue. Negative for chills, diaphoresis and fever.  HENT:  Positive for postnasal drip. Negative for ear pain.   Respiratory:  Positive for cough, shortness of breath and wheezing.   Cardiovascular:  Negative for chest pain.  Neurological:  Positive for headaches. Negative for dizziness.  Psychiatric/Behavioral:  Positive for sleep disturbance. Negative for dysphoric mood. The patient is not nervous/anxious.      Lab Results  Component Value Date   NA 143 05/22/2023   K 4.0 05/22/2023   CO2 24 05/22/2023   GLUCOSE 94 05/22/2023   BUN 16 05/22/2023   CREATININE 0.94 05/22/2023   CALCIUM 9.3 05/22/2023   EGFR 67 05/22/2023   GFRNONAA 71 09/16/2019   Lab Results  Component Value Date   CHOL 239 (H) 05/22/2023   HDL 66 05/22/2023   LDLCALC 157 (H) 05/22/2023   TRIG 92 05/22/2023   CHOLHDL 3.6 05/22/2023   Lab Results  Component Value Date   TSH 0.944 05/22/2023   Lab Results  Component Value Date   HGBA1C 5.2 09/25/2021   Lab Results  Component Value Date   WBC 4.7 05/22/2023   HGB 14.2 05/22/2023   HCT 42.8 05/22/2023   MCV 87 05/22/2023   PLT 205 05/22/2023   Lab Results  Component Value Date   ALT 9 05/22/2023   AST 13 05/22/2023   ALKPHOS  83 05/22/2023   BILITOT 0.9 05/22/2023   No results found for: MARIEN BOLLS, VD25OH   Patient Active Problem List   Diagnosis Date Noted   Hemorrhoids 12/09/2023   Cervical adenopathy 03/21/2021   Chronic rhinitis 02/24/2021   Cough variant asthma with component of UACS  01/13/2021   Lipoma of right lower extremity 03/08/2020   Lumbar degenerative disc disease 11/02/2019   Gastroesophageal reflux disease 09/10/2018   Neoplasm of uncertain behavior of skin 09/13/2016   Muscle cramp, nocturnal 09/13/2016   Adenomatous colon polyp 02/07/2015   Hyperlipidemia, mild 02/06/2015   Fibrocystic breast changes 10/26/2014   Depression, major, recurrent, moderate (HCC) 10/26/2014    Allergies  Allergen Reactions   Niacin And Related Hives    blisters    Past Surgical History:  Procedure Laterality Date   ANTERIOR AND POSTERIOR REPAIR N/A 09/24/2022   Procedure: ANTERIOR (CYSTOCELE) AND POSTERIOR REPAIR (RECTOCELE);  Surgeon: Janit Alm Agent, MD;  Location: [facility];  Service: Gynecology;  Laterality: N/A;   BLADDER SUSPENSION N/A 09/24/2022   Procedure: TOT;  Surgeon: Janit Alm Agent, MD;  Location: [facility];  Service: Gynecology;  Laterality: N/A;   CATARACT EXTRACTION W/PHACO Right 09/02/2017   Procedure: CATARACT EXTRACTION PHACO AND INTRAOCULAR LENS PLACEMENT (IOC) RIGHT SYMFONY TORIC;  Surgeon: Mittie Gaskin, MD;  Location: [facility];  Service: Ophthalmology;  Laterality: Right;   CATARACT EXTRACTION  W/PHACO Left 10/30/2017   Procedure: CATARACT EXTRACTION PHACO AND INTRAOCULAR LENS PLACEMENT (IOC)  LEFT SYMFONY LENS;  Surgeon: Mittie Gaskin, MD;  Location: [facility];  Service: Ophthalmology;  Laterality: Left;   COLONOSCOPY W/ POLYPECTOMY  2012   tubular adenoma 0.5cm was removed from descending colon   COSMETIC SURGERY     FACIAL COSMETIC SURGERY  2015   PARTIAL HYSTERECTOMY     bleeding; cervix and ovary remains    Social  History   Tobacco Use   Smoking status: Never   Smokeless tobacco: Never  Vaping Use   Vaping status: Never Used  Substance Use Topics   Alcohol use: Never   Drug use: No     Medication list has been reviewed and updated.  Current Meds  Medication Sig   albuterol  (VENTOLIN  HFA) 108 (90 Base) MCG/ACT inhaler Inhale 2 puffs into the lungs every 6 (six) hours as needed for wheezing or shortness of breath.   azithromycin  (ZITHROMAX  Z-PAK) 250 MG tablet UAD   predniSONE  (DELTASONE ) 10 MG tablet Take 6 tablets (60 mg total) by mouth daily with breakfast for 1 day, THEN 5 tablets (50 mg total) daily with breakfast for 1 day, THEN 4 tablets (40 mg total) daily with breakfast for 1 day, THEN 3 tablets (30 mg total) daily with breakfast for 1 day, THEN 2 tablets (20 mg total) daily with breakfast for 1 day, THEN 1 tablet (10 mg total) daily with breakfast for 1 day.       03/04/2024    2:43 PM 12/20/2023   11:16 AM 08/28/2023    2:04 PM 08/28/2023    2:01 PM  GAD 7 : Generalized Anxiety Score  Nervous, Anxious, on Edge 0 0 0 0  Control/stop worrying 0 0 0 0  Worry too much - different things 0 0 0   Trouble relaxing 0 0 0   Restless 0 0 0   Easily annoyed or irritable 0 0 0   Afraid - awful might happen 0 0 0   Total GAD 7 Score 0 0 0   Anxiety Difficulty Not difficult at all Not difficult at all Not difficult at all        03/04/2024    2:43 PM 12/20/2023   11:16 AM 12/09/2023    4:11 PM  Depression screen PHQ 2/9  Decreased Interest 0 0 0  Down, Depressed, Hopeless 0 0 0  PHQ - 2 Score 0 0 0  Altered sleeping 1 2 2   Tired, decreased energy 1 3 3   Change in appetite 0 0 0  Feeling bad or failure about yourself  0 0 0  Trouble concentrating 0 0 0  Moving slowly or fidgety/restless 0 0 0  Suicidal thoughts 0 0 0  PHQ-9 Score 2 5 5   Difficult doing work/chores Not difficult at all Not difficult at all Not difficult at all    BP Readings from Last 3 Encounters:  03/04/24  124/70  12/20/23 118/76  12/09/23 108/76    Physical Exam Constitutional:      Appearance: She is ill-appearing.  HENT:     Right Ear: Ear canal normal. Tympanic membrane is retracted. Tympanic membrane is not erythematous.     Left Ear: Ear canal normal. Tympanic membrane is retracted. Tympanic membrane is not erythematous.     Nose:     Right Sinus: No maxillary sinus tenderness or frontal sinus tenderness.     Left Sinus: No maxillary sinus tenderness or frontal  sinus tenderness.     Mouth/Throat:     Pharynx: No pharyngeal swelling or posterior oropharyngeal erythema.  Cardiovascular:     Rate and Rhythm: Normal rate and regular rhythm.     Heart sounds: No murmur heard. Pulmonary:     Effort: Pulmonary effort is normal.     Breath sounds: No wheezing or rhonchi.  Musculoskeletal:     Cervical back: Normal range of motion.  Lymphadenopathy:     Cervical: No cervical adenopathy.  Neurological:     Mental Status: She is alert.     Wt Readings from Last 3 Encounters:  03/04/24 158 lb (71.7 kg)  12/20/23 157 lb (71.2 kg)  12/09/23 158 lb 12.8 oz (72 kg)    BP 124/70   Pulse 92   Temp 98.3 F (36.8 C)   Ht 5' 4 (1.626 m)   Wt 158 lb (71.7 kg)   SpO2 100%   BMI 27.12 kg/m   Assessment and Plan:  Problem List Items Addressed This Visit   None Visit Diagnoses       Moderate asthma with exacerbation, unspecified whether persistent    -  Primary   suspect viral illness triggering asthma exacerbation treat for underlying bacterial infection Albuterol  MDI, steroid taper, cough suppressants   Relevant Medications   predniSONE  (DELTASONE ) 10 MG tablet   albuterol  (VENTOLIN  HFA) 108 (90 Base) MCG/ACT inhaler     Bronchitis       Relevant Medications   azithromycin  (ZITHROMAX  Z-PAK) 250 MG tablet   promethazine -dextromethorphan (PROMETHAZINE -DM) 6.25-15 MG/5ML syrup       No follow-ups on file.    Leita HILARIO Adie, MD [facility] [place]

## 2024-03-29 ENCOUNTER — Other Ambulatory Visit: Payer: Self-pay | Admitting: Internal Medicine

## 2024-03-29 DIAGNOSIS — F331 Major depressive disorder, recurrent, moderate: Secondary | ICD-10-CM

## 2024-03-30 NOTE — Telephone Encounter (Signed)
 Requested medications are due for refill today.  yes  Requested medications are on the active medications list.  yes  Last refill. 10/04/2023 #90 1 rf  Future visit scheduled.   yes  Notes to clinic.  Refill not delegated.    Requested Prescriptions  Pending Prescriptions Disp Refills   ARIPiprazole  (ABILIFY ) 2 MG tablet [Pharmacy Med Name: ARIPIPRAZOLE  2 MG TABLET] 90 tablet 1    Sig: TAKE 1 TABLET BY MOUTH EVERY DAY     Not Delegated - Psychiatry:  Antipsychotics - Second Generation (Atypical) - aripiprazole  Failed - 03/30/2024  4:50 PM      Failed - This refill cannot be delegated      Failed - Lipid Panel in normal range within the last 12 months    Cholesterol, Total  Date Value Ref Range Status  05/22/2023 239 (H) 100 - 199 mg/dL Final   LDL Chol Calc (NIH)  Date Value Ref Range Status  05/22/2023 157 (H) 0 - 99 mg/dL Final   HDL  Date Value Ref Range Status  05/22/2023 66 >39 mg/dL Final   Triglycerides  Date Value Ref Range Status  05/22/2023 92 0 - 149 mg/dL Final         Passed - TSH in normal range and within 360 days    TSH  Date Value Ref Range Status  05/22/2023 0.944 0.450 - 4.500 uIU/mL Final         Passed - Completed PHQ-2 or PHQ-9 in the last 360 days      Passed - Last BP in normal range    BP Readings from Last 1 Encounters:  03/04/24 124/70         Passed - Last Heart Rate in normal range    Pulse Readings from Last 1 Encounters:  03/04/24 92         Passed - Valid encounter within last 6 months    Recent Outpatient Visits           3 weeks ago Moderate asthma with exacerbation, unspecified whether persistent   [place] [facility] at [facility], Leita DEL, MD   3 months ago Drug-induced constipation   [place] [facility] at [facility], Leita DEL, MD   3 months ago Hemorrhoids, unspecified hemorrhoid type   [facility] & Sports Medicine at  [facility], Selinda PARAS, MD   5 months ago Dysuria   [facility] Primary Care & Sports Medicine at [facility], Leita DEL, MD   7 months ago Cough variant asthma    Primary Care & Sports Medicine at [facility], Leita DEL, MD       Future Appointments             In 2 months Manya Toribio SQUIBB, PA [facility] Primary Care & Sports Medicine at [facility], [phone] [place]            Passed - CBC within normal limits and completed in the last 12 months    WBC  Date Value Ref Range Status  05/22/2023 4.7 3.4 - 10.8 x10E3/uL Final  01/13/2021 4.5 4.0 - 10.5 K/uL Final   RBC  Date Value Ref Range Status  05/22/2023 4.95 3.77 - 5.28 x10E6/uL Final  01/13/2021 4.60 3.87 - 5.11 MIL/uL Final   Hemoglobin  Date Value Ref Range Status  05/22/2023 14.2 11.1 - 15.9 g/dL Final   Hematocrit  Date  Value Ref Range Status  05/22/2023 42.8 34.0 - 46.6 % Final   MCHC  Date Value Ref Range Status  05/22/2023 33.2 31.5 - 35.7 g/dL Final  90/90/7977 65.6 30.0 - 36.0 g/dL Final   [facility]  Date Value Ref Range Status  05/22/2023 28.7 26.6 - 33.0 pg Final  01/13/2021 29.3 26.0 - 34.0 pg Final   MCV  Date Value Ref Range Status  05/22/2023 87 79 - 97 fL Final  11/15/2012 82 80 - 100 fL Final   No results found for: PLTCOUNTKUC, LABPLAT, POCPLA RDW  Date Value Ref Range Status  05/22/2023 11.9 11.7 - 15.4 % Final  11/15/2012 12.3 11.5 - 14.5 % Final         Passed - CMP within normal limits and completed in the last 12 months    Albumin  Date Value Ref Range Status  05/22/2023 4.5 3.9 - 4.9 g/dL Final  92/87/7985 4.5 3.4 - 5.0 g/dL Final   Alkaline Phosphatase  Date Value Ref Range Status  05/22/2023 83 44 - 121 IU/L Final  11/15/2012 92 50 - 136 Unit/L Final   ALT  Date Value Ref Range Status  05/22/2023 9 0 - 32 IU/L Final   SGPT (ALT)  Date Value Ref Range Status  11/15/2012 18 12 - 78 U/L Final   AST  Date Value  Ref Range Status  05/22/2023 13 0 - 40 IU/L Final   SGOT(AST)  Date Value Ref Range Status  11/15/2012 16 15 - 37 Unit/L Final   BUN  Date Value Ref Range Status  05/22/2023 16 8 - 27 mg/dL Final  92/87/7985 10 7 - 18 mg/dL Final   Calcium  Date Value Ref Range Status  05/22/2023 9.3 8.7 - 10.3 mg/dL Final   Calcium, Total  Date Value Ref Range Status  11/15/2012 9.5 8.5 - 10.1 mg/dL Final   CO2  Date Value Ref Range Status  05/22/2023 24 20 - 29 mmol/L Final   Co2  Date Value Ref Range Status  11/15/2012 29 21 - 32 mmol/L Final   Creatinine  Date Value Ref Range Status  11/15/2012 1.02 0.60 - 1.30 mg/dL Final   Creatinine, Ser  Date Value Ref Range Status  05/22/2023 0.94 0.57 - 1.00 mg/dL Final   Glucose  Date Value Ref Range Status  05/22/2023 94 70 - 99 mg/dL Final  92/87/7985 891 (H) 65 - 99 mg/dL Final   Potassium  Date Value Ref Range Status  05/22/2023 4.0 3.5 - 5.2 mmol/L Final  11/15/2012 3.5 3.5 - 5.1 mmol/L Final   Sodium  Date Value Ref Range Status  05/22/2023 143 134 - 144 mmol/L Final  11/15/2012 143 136 - 145 mmol/L Final   Bilirubin,Total  Date Value Ref Range Status  11/15/2012 1.8 (H) 0.2 - 1.0 mg/dL Final   Bilirubin Total  Date Value Ref Range Status  05/22/2023 0.9 0.0 - 1.2 mg/dL Final   Protein, ur  Date Value Ref Range Status  09/26/2023 NEGATIVE NEGATIVE mg/dL Final   Protein, UA  Date Value Ref Range Status  10/04/2023 Negative Negative Final   Total Protein  Date Value Ref Range Status  05/22/2023 6.3 6.0 - 8.5 g/dL Final  92/87/7985 7.4 6.4 - 8.2 g/dL Final   EGFR (African American)  Date Value Ref Range Status  11/15/2012 >60  Final   GFR calc Af Amer  Date Value Ref Range Status  09/16/2019 81 >59 mL/min/1.73 Final    Comment:    **  [facility] currently reports eGFR in compliance with the current**   recommendations of the [employer]. [facility] will   update reporting as new guidelines are  published from the NKF-ASN   Task force.    eGFR  Date Value Ref Range Status  05/22/2023 67 >59 mL/min/1.73 Final   EGFR (Non-African Amer.)  Date Value Ref Range Status  11/15/2012 >60  Final    Comment:    eGFR values <3mL/min/1.73 m2 may be an indication of chronic kidney disease (CKD). Calculated eGFR is useful in patients with stable renal function. The eGFR calculation will not be reliable in acutely ill patients when serum creatinine is changing rapidly. It is not useful in  patients on dialysis. The eGFR calculation may not be applicable to patients at the low and high extremes of body sizes, pregnant women, and vegetarians.    GFR calc non Af Amer  Date Value Ref Range Status  09/16/2019 71 >59 mL/min/1.73 Final         Signed Prescriptions Disp Refills   buPROPion  ER (WELLBUTRIN  SR) 100 MG 12 hr tablet 90 tablet 0    Sig: TAKE 1 TABLET BY MOUTH EVERY DAY IN THE MORNING     Psychiatry: Antidepressants - bupropion  Passed - 03/30/2024  4:50 PM      Passed - Cr in normal range and within 360 days    Creatinine  Date Value Ref Range Status  11/15/2012 1.02 0.60 - 1.30 mg/dL Final   Creatinine, Ser  Date Value Ref Range Status  05/22/2023 0.94 0.57 - 1.00 mg/dL Final         Passed - AST in normal range and within 360 days    AST  Date Value Ref Range Status  05/22/2023 13 0 - 40 IU/L Final   SGOT(AST)  Date Value Ref Range Status  11/15/2012 16 15 - 37 Unit/L Final         Passed - ALT in normal range and within 360 days    ALT  Date Value Ref Range Status  05/22/2023 9 0 - 32 IU/L Final   SGPT (ALT)  Date Value Ref Range Status  11/15/2012 18 12 - 78 U/L Final         Passed - Completed PHQ-2 or PHQ-9 in the last 360 days      Passed - Last BP in normal range    BP Readings from Last 1 Encounters:  03/04/24 124/70         Passed - Valid encounter within last 6 months    Recent Outpatient Visits           3 weeks ago Moderate asthma  with exacerbation, unspecified whether persistent   [place] [facility] at [facility], Leita DEL, MD   3 months ago Drug-induced constipation   [facility] Care & Sports Medicine at [facility], Leita DEL, MD   3 months ago Hemorrhoids, unspecified hemorrhoid type   [facility] Care & Sports [facility] at [facility], Selinda PARAS, MD   5 months ago Dysuria   [facility] Primary Care & Sports Medicine at [facility], Leita DEL, MD   7 months ago Cough variant asthma   [place] [facility] Medicine at [facility], Leita DEL, MD       Future Appointments             In 2 months Manya Toribio SQUIBB,  PA [facility] Primary Care & Sports Medicine at [facility], [phone] [place]             venlafaxine  XR (EFFEXOR -XR) 150 MG 24 hr capsule 90 capsule 0    Sig: TAKE 1 CAPSULE BY MOUTH EVERY MORNING.     Psychiatry: Antidepressants - SNRI - desvenlafaxine  & venlafaxine  Failed - 03/30/2024  4:50 PM      Failed - Lipid Panel in normal range within the last 12 months    Cholesterol, Total  Date Value Ref Range Status  05/22/2023 239 (H) 100 - 199 mg/dL Final   LDL Chol Calc (NIH)  Date Value Ref Range Status  05/22/2023 157 (H) 0 - 99 mg/dL Final   HDL  Date Value Ref Range Status  05/22/2023 66 >39 mg/dL Final   Triglycerides  Date Value Ref Range Status  05/22/2023 92 0 - 149 mg/dL Final         Passed - Cr in normal range and within 360 days    Creatinine  Date Value Ref Range Status  11/15/2012 1.02 0.60 - 1.30 mg/dL Final   Creatinine, Ser  Date Value Ref Range Status  05/22/2023 0.94 0.57 - 1.00 mg/dL Final         Passed - Completed PHQ-2 or PHQ-9 in the last 360 days      Passed - Last BP in normal range    BP Readings from Last 1 Encounters:  03/04/24 124/70         Passed - Valid encounter within last 6 months    Recent Outpatient Visits            3 weeks ago Moderate asthma with exacerbation, unspecified whether persistent   [place] [facility] at [facility], Leita DEL, MD   3 months ago Drug-induced constipation   [facility] Primary Care & Sports Medicine at [facility], Leita DEL, MD   3 months ago Hemorrhoids, unspecified hemorrhoid type   [facility] Primary Care & Sports Medicine at [facility], Selinda PARAS, MD   5 months ago Dysuria   [facility] Primary Care & Sports Medicine at [facility], Leita DEL, MD   7 months ago Cough variant asthma   [place] [facility] Sports Medicine at [facility], Leita DEL, MD       Future Appointments             In 2 months Manya, Toribio SQUIBB, PA [facility] Health Primary Care & Sports Medicine at [facility], [phone] [place]

## 2024-03-30 NOTE — Telephone Encounter (Signed)
 Requested Prescriptions  Pending Prescriptions Disp Refills   buPROPion  ER (WELLBUTRIN  SR) 100 MG 12 hr tablet [Pharmacy Med Name: BUPROPION  HCL SR 100 MG TABLET] 90 tablet 0    Sig: TAKE 1 TABLET BY MOUTH EVERY DAY IN THE MORNING     Psychiatry: Antidepressants - bupropion  Passed - 03/30/2024  4:50 PM      Passed - Cr in normal range and within 360 days    Creatinine  Date Value Ref Range Status  11/15/2012 1.02 0.60 - 1.30 mg/dL Final   Creatinine, Ser  Date Value Ref Range Status  05/22/2023 0.94 0.57 - 1.00 mg/dL Final         Passed - AST in normal range and within 360 days    AST  Date Value Ref Range Status  05/22/2023 13 0 - 40 IU/L Final   SGOT(AST)  Date Value Ref Range Status  11/15/2012 16 15 - 37 Unit/L Final         Passed - ALT in normal range and within 360 days    ALT  Date Value Ref Range Status  05/22/2023 9 0 - 32 IU/L Final   SGPT (ALT)  Date Value Ref Range Status  11/15/2012 18 12 - 78 U/L Final         Passed - Completed PHQ-2 or PHQ-9 in the last 360 days      Passed - Last BP in normal range    BP Readings from Last 1 Encounters:  03/04/24 124/70         Passed - Valid encounter within last 6 months    Recent Outpatient Visits           3 weeks ago Moderate asthma with exacerbation, unspecified whether persistent   [place] [facility] at [facility], Leita DEL, MD   3 months ago Drug-induced constipation   [place] [facility] & Sports Medicine at [facility], Leita DEL, MD   3 months ago Hemorrhoids, unspecified hemorrhoid type   [facility] Primary Care & Sports Medicine at [facility], Selinda PARAS, MD   5 months ago Dysuria   [facility] Primary Care & Sports Medicine at [facility], Leita DEL, MD   7 months ago Cough variant asthma   [place] Primary Care & Sports Medicine at [facility], Leita DEL, MD       Future Appointments              In 2 months Manya, Toribio SQUIBB, PA [facility] Health Primary Care & Sports Medicine at [place], [phone] [place]             venlafaxine  XR (EFFEXOR -XR) 150 MG 24 hr capsule [Pharmacy Med Name: VENLAFAXINE  HCL ER 150 MG CAP] 90 capsule 0    Sig: TAKE 1 CAPSULE BY MOUTH EVERY MORNING.     Psychiatry: Antidepressants - SNRI - desvenlafaxine  & venlafaxine  Failed - 03/30/2024  4:50 PM      Failed - Lipid Panel in normal range within the last 12 months    Cholesterol, Total  Date Value Ref Range Status  05/22/2023 239 (H) 100 - 199 mg/dL Final   LDL Chol Calc (NIH)  Date Value Ref Range Status  05/22/2023 157 (H) 0 - 99 mg/dL Final   HDL  Date Value Ref Range Status  05/22/2023 66 >39 mg/dL Final   Triglycerides  Date Value Ref Range Status  05/22/2023 92 0 - 149  mg/dL Final         Passed - Cr in normal range and within 360 days    Creatinine  Date Value Ref Range Status  11/15/2012 1.02 0.60 - 1.30 mg/dL Final   Creatinine, Ser  Date Value Ref Range Status  05/22/2023 0.94 0.57 - 1.00 mg/dL Final         Passed - Completed PHQ-2 or PHQ-9 in the last 360 days      Passed - Last BP in normal range    BP Readings from Last 1 Encounters:  03/04/24 124/70         Passed - Valid encounter within last 6 months    Recent Outpatient Visits           3 weeks ago Moderate asthma with exacerbation, unspecified whether persistent   [place] [facility] at [facility], Leita DEL, MD   3 months ago Drug-induced constipation   [place] [facility] Care & Sports Medicine at [facility], Leita DEL, MD   3 months ago Hemorrhoids, unspecified hemorrhoid type   [place] [facility] at [facility], Selinda PARAS, MD   5 months ago Dysuria   [facility] Health Primary Care & Sports Medicine at [facility], Leita DEL, MD   7 months ago Cough variant asthma   [place] [facility] Care & Sports Medicine at  [facility], Leita DEL, MD       Future Appointments             In 2 months Manya, Toribio SQUIBB, PA [facility] Primary Care & Sports Medicine at [facility], [phone] [place]             ARIPiprazole  (ABILIFY ) 2 MG tablet [Pharmacy Med Name: ARIPIPRAZOLE  2 MG TABLET] 90 tablet 1    Sig: TAKE 1 TABLET BY MOUTH EVERY DAY     Not Delegated - Psychiatry:  Antipsychotics - Second Generation (Atypical) - aripiprazole  Failed - 03/30/2024  4:50 PM      Failed - This refill cannot be delegated      Failed - Lipid Panel in normal range within the last 12 months    Cholesterol, Total  Date Value Ref Range Status  05/22/2023 239 (H) 100 - 199 mg/dL Final   LDL Chol Calc (NIH)  Date Value Ref Range Status  05/22/2023 157 (H) 0 - 99 mg/dL Final   HDL  Date Value Ref Range Status  05/22/2023 66 >39 mg/dL Final   Triglycerides  Date Value Ref Range Status  05/22/2023 92 0 - 149 mg/dL Final         Passed - TSH in normal range and within 360 days    TSH  Date Value Ref Range Status  05/22/2023 0.944 0.450 - 4.500 uIU/mL Final         Passed - Completed PHQ-2 or PHQ-9 in the last 360 days      Passed - Last BP in normal range    BP Readings from Last 1 Encounters:  03/04/24 124/70         Passed - Last Heart Rate in normal range    Pulse Readings from Last 1 Encounters:  03/04/24 92         Passed - Valid encounter within last 6 months    Recent Outpatient Visits           3 weeks ago Moderate asthma with exacerbation, unspecified  whether persistent   [place] [facility] at [facility], Leita DEL, MD   3 months ago Drug-induced constipation   [place] [facility] at [facility], Leita DEL, MD   3 months ago Hemorrhoids, unspecified hemorrhoid type   [facility] Primary Care & Sports Medicine at [facility], Selinda PARAS, MD   5 months ago Dysuria   [facility] Health Primary Care &  Sports Medicine at [facility], Leita DEL, MD   7 months ago Cough variant asthma   [place] [facility] Care & Sports Medicine at [facility], Leita DEL, MD       Future Appointments             In 2 months Manya Toribio SQUIBB, PA [facility] Health Primary Care & Sports Medicine at [facility], [phone] [place]            Passed - CBC within normal limits and completed in the last 12 months    WBC  Date Value Ref Range Status  05/22/2023 4.7 3.4 - 10.8 x10E3/uL Final  01/13/2021 4.5 4.0 - 10.5 K/uL Final   RBC  Date Value Ref Range Status  05/22/2023 4.95 3.77 - 5.28 x10E6/uL Final  01/13/2021 4.60 3.87 - 5.11 MIL/uL Final   Hemoglobin  Date Value Ref Range Status  05/22/2023 14.2 11.1 - 15.9 g/dL Final   Hematocrit  Date Value Ref Range Status  05/22/2023 42.8 34.0 - 46.6 % Final   MCHC  Date Value Ref Range Status  05/22/2023 33.2 31.5 - 35.7 g/dL Final  90/90/7977 65.6 30.0 - 36.0 g/dL Final   [facility]  Date Value Ref Range Status  05/22/2023 28.7 26.6 - 33.0 pg Final  01/13/2021 29.3 26.0 - 34.0 pg Final   MCV  Date Value Ref Range Status  05/22/2023 87 79 - 97 fL Final  11/15/2012 82 80 - 100 fL Final   No results found for: PLTCOUNTKUC, LABPLAT, POCPLA RDW  Date Value Ref Range Status  05/22/2023 11.9 11.7 - 15.4 % Final  11/15/2012 12.3 11.5 - 14.5 % Final         Passed - CMP within normal limits and completed in the last 12 months    Albumin  Date Value Ref Range Status  05/22/2023 4.5 3.9 - 4.9 g/dL Final  92/87/7985 4.5 3.4 - 5.0 g/dL Final   Alkaline Phosphatase  Date Value Ref Range Status  05/22/2023 83 44 - 121 IU/L Final  11/15/2012 92 50 - 136 Unit/L Final   ALT  Date Value Ref Range Status  05/22/2023 9 0 - 32 IU/L Final   SGPT (ALT)  Date Value Ref Range Status  11/15/2012 18 12 - 78 U/L Final   AST  Date Value Ref Range Status  05/22/2023 13 0 - 40 IU/L Final   SGOT(AST)  Date Value Ref Range Status   11/15/2012 16 15 - 37 Unit/L Final   BUN  Date Value Ref Range Status  05/22/2023 16 8 - 27 mg/dL Final  92/87/7985 10 7 - 18 mg/dL Final   Calcium  Date Value Ref Range Status  05/22/2023 9.3 8.7 - 10.3 mg/dL Final   Calcium, Total  Date Value Ref Range Status  11/15/2012 9.5 8.5 - 10.1 mg/dL Final   CO2  Date Value Ref Range Status  05/22/2023 24 20 - 29 mmol/L Final   Co2  Date Value Ref Range Status  11/15/2012 29  21 - 32 mmol/L Final   Creatinine  Date Value Ref Range Status  11/15/2012 1.02 0.60 - 1.30 mg/dL Final   Creatinine, Ser  Date Value Ref Range Status  05/22/2023 0.94 0.57 - 1.00 mg/dL Final   Glucose  Date Value Ref Range Status  05/22/2023 94 70 - 99 mg/dL Final  92/87/7985 891 (H) 65 - 99 mg/dL Final   Potassium  Date Value Ref Range Status  05/22/2023 4.0 3.5 - 5.2 mmol/L Final  11/15/2012 3.5 3.5 - 5.1 mmol/L Final   Sodium  Date Value Ref Range Status  05/22/2023 143 134 - 144 mmol/L Final  11/15/2012 143 136 - 145 mmol/L Final   Bilirubin,Total  Date Value Ref Range Status  11/15/2012 1.8 (H) 0.2 - 1.0 mg/dL Final   Bilirubin Total  Date Value Ref Range Status  05/22/2023 0.9 0.0 - 1.2 mg/dL Final   Protein, ur  Date Value Ref Range Status  09/26/2023 NEGATIVE NEGATIVE mg/dL Final   Protein, UA  Date Value Ref Range Status  10/04/2023 Negative Negative Final   Total Protein  Date Value Ref Range Status  05/22/2023 6.3 6.0 - 8.5 g/dL Final  92/87/7985 7.4 6.4 - 8.2 g/dL Final   EGFR (African American)  Date Value Ref Range Status  11/15/2012 >60  Final   GFR calc Af Amer  Date Value Ref Range Status  09/16/2019 81 >59 mL/min/1.73 Final    Comment:    **[facility] currently reports eGFR in compliance with the current**   recommendations of the [employer]. [facility] will   update reporting as new guidelines are published from the NKF-ASN   Task force.    eGFR  Date Value Ref Range Status  05/22/2023 67  >59 mL/min/1.73 Final   EGFR (Non-African Amer.)  Date Value Ref Range Status  11/15/2012 >60  Final    Comment:    eGFR values <52mL/min/1.73 m2 may be an indication of chronic kidney disease (CKD). Calculated eGFR is useful in patients with stable renal function. The eGFR calculation will not be reliable in acutely ill patients when serum creatinine is changing rapidly. It is not useful in  patients on dialysis. The eGFR calculation may not be applicable to patients at the low and high extremes of body sizes, pregnant women, and vegetarians.    GFR calc non Af Amer  Date Value Ref Range Status  09/16/2019 71 >59 mL/min/1.73 Final

## 2024-03-31 ENCOUNTER — Other Ambulatory Visit: Payer: Self-pay

## 2024-05-22 ENCOUNTER — Ambulatory Visit

## 2024-05-25 ENCOUNTER — Ambulatory Visit: Admitting: Family Medicine

## 2024-05-25 ENCOUNTER — Ambulatory Visit: Payer: Self-pay

## 2024-05-25 ENCOUNTER — Encounter: Payer: Self-pay | Admitting: Family Medicine

## 2024-05-25 VITALS — BP 148/92 | HR 88 | Temp 98.3°F | Ht 64.0 in | Wt 162.0 lb

## 2024-05-25 DIAGNOSIS — R10A2 Flank pain, left side: Secondary | ICD-10-CM | POA: Diagnosis not present

## 2024-05-25 DIAGNOSIS — R1032 Left lower quadrant pain: Secondary | ICD-10-CM | POA: Diagnosis not present

## 2024-05-25 LAB — POCT URINE DIPSTICK
Bilirubin, UA: NEGATIVE
Glucose, UA: NEGATIVE mg/dL
Ketones, POC UA: NEGATIVE mg/dL
Nitrite, UA: NEGATIVE
POC PROTEIN,UA: NEGATIVE
Spec Grav, UA: 1.015
Urobilinogen, UA: 0.2 U/dL
pH, UA: 6.5

## 2024-05-25 NOTE — Progress Notes (Signed)
 "  Acute Office Visit  Subjective:     Patient ID: Desiree Grant, female    DOB: [birth year], 69 y.o.   MRN: [id number]  Chief Complaint  Patient presents with   Flank Pain    Left flank moderate pain especially with pressure, back pain urinary frequency.--6 days    Discussed the use of AI scribe software for clinical note transcription with the patient, who gave verbal consent to proceed.  History of Present Illness Desiree Grant is a 69 year old female who presents with left flank pain and urinary urgency.  She has been experiencing left flank pain since last Wednesday. The pain is constant, severe, and rated as six or seven out of ten when touched. Initially, it was so severe that she could not stand up straight or walk, but it has since improved slightly. The pain radiates to her back and inner thigh. No fever, nausea, vomiting, or blood in her urine or stool.  She experiences urinary urgency and a sensation of fullness without burning. She feels like she is not emptying her bladder completely. She denies any history of kidney stones. Her bowel movements have changed since the onset of pain, and she is unable to strain well. She has not been passing gas as usual.  Her past medical history includes a surgical procedure for vaginal prolapse, after which she has been wearing a pad regularly. She has taken six Tylenol  tablets in total for pain relief since the onset of symptoms.  She recalls a fall approximately two and a half months ago, where she fell flat on her right side, resulting in a busted lip. She also mentions a more severe fall about a year and a half ago.     Review of Systems  All other systems reviewed and are negative.       Objective:    BP (!) 148/92   Pulse 88   Temp 98.3 F (36.8 C)   Ht 5' 4 (1.626 m)   Wt 162 lb (73.5 kg)   SpO2 99%   BMI 27.81 kg/m  BP Readings from Last 3 Encounters:  05/25/24 (!) 148/92  03/04/24 124/70  12/20/23 118/76   Wt  Readings from Last 3 Encounters:  05/25/24 162 lb (73.5 kg)  03/04/24 158 lb (71.7 kg)  12/20/23 157 lb (71.2 kg)      Physical Exam Vitals and nursing note reviewed.  Constitutional:      Appearance: Normal appearance.  HENT:     Head: Normocephalic.     Right Ear: External ear normal.     Left Ear: External ear normal.  Eyes:     Conjunctiva/sclera: Conjunctivae normal.  Cardiovascular:     Rate and Rhythm: Normal rate.  Pulmonary:     Effort: Pulmonary effort is normal. No respiratory distress.  Abdominal:     Palpations: Abdomen is soft.  Musculoskeletal:        General: Normal range of motion.  Skin:    General: Skin is warm.  Neurological:     Mental Status: She is alert and oriented to person, place, and time.  Psychiatric:        Mood and Affect: Mood normal.     No results found for any visits on 05/25/24.      Assessment & Plan:   Assessment & Plan Left flank pain  Orders:   POCT URINE DIPSTICK   Urine Culture  Left lower quadrant abdominal pain  Orders:   CT  RENAL STONE STUDY; Future   Assessment and Plan Assessment & Plan Left flank and lower abdominal pain Persistent pain with differential diagnosis including kidney stones, diverticulitis, or UTI. Trace hematuria noted, no infection signs. - Ordered CT scan to evaluate for kidney stones or diverticulitis. - Advised ER visit if symptoms worsen or persist. - Ran urine culture to rule out infection. - Recommended Tylenol  for pain. - Advised to monitor for fever, chills, nausea, vomiting, blood in stool, or urine.  Constipation Difficulty with bowel movements likely due to pain and decreased straining ability. - Recommended Miralax starting with one scoop, increasing to two if needed. - Advised to avoid straining during bowel movements. - Suggested dietary modifications to include more fiber and avoid heavy meals.  Dehydration Dehydration possibly contributing to constipation and  discomfort. - Advised to increase fluid intake.  Hypertension Elevated blood pressure likely secondary to pain, previous readings normal. - Continue to monitor blood pressure and manage pain.     No follow-ups on file.  Ahtziry Saathoff K Latorya Bautch, MD  "

## 2024-05-25 NOTE — Telephone Encounter (Signed)
 fyi

## 2024-05-25 NOTE — Telephone Encounter (Signed)
 FYI Only or Action Required?: FYI only for provider: appointment scheduled on 05/25/24.  Patient was last seen in primary care on 03/04/2024 by Justus Leita DEL, MD.  Called Nurse Triage reporting Flank Pain.  Symptoms began 5 days ago.  Interventions attempted: Nothing.  Symptoms are: pain has gradually improved.  Triage Disposition: See Physician Within 24 Hours  Patient/caregiver understands and will follow disposition?: Yes                    Message from Olam RAMAN sent at 05/25/2024 10:47 AM EST  Reason for Triage: Pt might have a uti or kidney stones, has side pain since last Wednesday PT DISCONNECTED WHILE ON HOLD FOR NT   Reason for Disposition  MODERATE pain (e.g., interferes with normal activities or awakens from sleep)  Answer Assessment - Initial Assessment Questions 1. LOCATION: Where does it hurt? (e.g., left, right)     Left flank.  2. ONSET: When did the pain start?     Wednesday.  3. SEVERITY: How bad is the pain? (e.g., Scale 1-10; mild, moderate, or severe)     5/10.  4. PATTERN: Does the pain come and go, or is it constant?      Comes and goes, feels the pain when lying on her back, urinating or bladder feels full, when pressing on that side, or when going from lying to sitting position/sitting to standing.  5. CAUSE: What do you think is causing the pain?     UTI or kidney stone.  6. OTHER SYMPTOMS:  Do you have any other symptoms? (e.g., fever, abdomen pain, vomiting, leg weakness, burning with urination, blood in urine)     Urinary frequency. No vomiting, fever, pain or burning with urination.  Protocols used: Flank Pain-A-AH

## 2024-05-26 ENCOUNTER — Ambulatory Visit
Admission: RE | Admit: 2024-05-26 | Discharge: 2024-05-26 | Disposition: A | Discharge status: Home / Self Care | Source: Ambulatory Visit | Attending: Family Medicine | Admitting: Family Medicine

## 2024-05-26 ENCOUNTER — Other Ambulatory Visit: Payer: Self-pay | Admitting: Family Medicine

## 2024-05-26 ENCOUNTER — Ambulatory Visit: Payer: Self-pay | Admitting: Family Medicine

## 2024-05-26 DIAGNOSIS — R1032 Left lower quadrant pain: Secondary | ICD-10-CM | POA: Diagnosis present

## 2024-05-26 DIAGNOSIS — K5792 Diverticulitis of intestine, part unspecified, without perforation or abscess without bleeding: Secondary | ICD-10-CM

## 2024-05-26 MED ORDER — METRONIDAZOLE 500 MG PO TABS: 500.0000 mg | ORAL_TABLET | Freq: Two times a day (BID) | ORAL | 0 refills | Status: AC

## 2024-05-26 MED ORDER — CIPROFLOXACIN HCL 500 MG PO TABS: 500.0000 mg | ORAL_TABLET | Freq: Two times a day (BID) | ORAL | 0 refills | Status: AC

## 2024-05-26 NOTE — Progress Notes (Signed)
"   Cipro  and flagyl  for 7 days. Please take probiotics. Rx called in for Diverticulitis. Please schedule 2 weeks follow up with me. "

## 2024-05-27 LAB — URINE CULTURE

## 2024-05-27 LAB — SPECIMEN STATUS REPORT

## 2024-05-28 ENCOUNTER — Ambulatory Visit (INDEPENDENT_AMBULATORY_CARE_PROVIDER_SITE_OTHER)

## 2024-05-28 VITALS — BP 121/79 | Ht 64.0 in | Wt 161.2 lb

## 2024-05-28 DIAGNOSIS — Z1231 Encounter for screening mammogram for malignant neoplasm of breast: Secondary | ICD-10-CM | POA: Diagnosis not present

## 2024-05-28 DIAGNOSIS — Z0000 Encounter for general adult medical examination without abnormal findings: Secondary | ICD-10-CM | POA: Diagnosis not present

## 2024-05-28 DIAGNOSIS — Z780 Asymptomatic menopausal state: Secondary | ICD-10-CM

## 2024-05-28 DIAGNOSIS — Z1211 Encounter for screening for malignant neoplasm of colon: Secondary | ICD-10-CM

## 2024-05-28 NOTE — Progress Notes (Signed)
 "  Chief Complaint  Patient presents with   Medicare Wellness     Subjective:   Desiree Grant is a 69 y.o. female who presents for a Medicare Annual Wellness Visit.  Visit info / Clinical Intake: Medicare Wellness Visit Type:: Subsequent Annual Wellness Visit Persons participating in visit and providing information:: patient Medicare Wellness Visit Mode:: In-person (required for WTM) Interpreter Needed?: No Pre-visit prep was completed: yes AWV questionnaire completed by patient prior to visit?: yes Date:: 05/27/24 Living arrangements:: (!) (Patient-Rptd) lives alone Patient's Overall Health Status Rating: (Patient-Rptd) good Typical amount of pain: (Patient-Rptd) some Does pain affect daily life?: (!) (Patient-Rptd) yes Are you currently prescribed opioids?: no  Dietary Habits and Nutritional Risks How many meals a day?: (Patient-Rptd) 2 Eats fruit and vegetables daily?: (!) (Patient-Rptd) no Most meals are obtained by: (Patient-Rptd) preparing own meals; eating out In the last 2 weeks, have you had any of the following?: none Diabetic:: no  Functional Status Activities of Daily Living (to include ambulation/medication): (Patient-Rptd) Independent Ambulation: (Patient-Rptd) Independent Medication Administration: (Patient-Rptd) Independent Home Management (perform basic housework or laundry): (Patient-Rptd) Independent Manage your own finances?: (Patient-Rptd) yes Primary transportation is: (Patient-Rptd) driving Concerns about vision?: no *vision screening is required for WTM* Concerns about hearing?: no  Fall Screening Falls in the past year?: (Patient-Rptd) 1 Number of falls in past year: (Patient-Rptd) 0 Was there an injury with Fall?: (Patient-Rptd) 1 Fall Risk Category Calculator: (Patient-Rptd) 2 Patient Fall Risk Level: (Patient-Rptd) Moderate Fall Risk  Fall Risk Patient at Risk for Falls Due to: No Fall Risks Fall risk Follow up: Falls evaluation completed;  Falls prevention discussed; Education provided  Home and Transportation Safety: All rugs have non-skid backing?: (Patient-Rptd) yes All stairs or steps have railings?: (Patient-Rptd) yes Grab bars in the bathtub or shower?: (!) (Patient-Rptd) no Have non-skid surface in bathtub or shower?: (Patient-Rptd) yes Good home lighting?: (Patient-Rptd) yes Regular seat belt use?: (Patient-Rptd) yes Hospital stays in the last year:: (Patient-Rptd) no  Cognitive Assessment Difficulty concentrating, remembering, or making decisions? : (Patient-Rptd) yes Will 6CIT or Mini Cog be Completed: yes What year is it?: 0 points What month is it?: 0 points Give patient an address phrase to remember (5 components): [address] [place]  About what time is it?: 0 points Count backwards from 20 to 1: 0 points Say the months of the year in reverse: 0 points Repeat the address phrase from earlier: 2 points 6 CIT Score: 2 points  Advance Directives (For Healthcare) Does Patient Have a Medical Advance Directive?: Yes Does patient want to make changes to medical advance directive?: No - Patient declined Type of Advance Directive: Healthcare Power of [place]; Living will Copy of Healthcare Power of Attorney in Chart?: No - copy requested Copy of Living Will in Chart?: No - copy requested  Reviewed/Updated  Reviewed/Updated: Reviewed All (Medical, Surgical, Family, Medications, Allergies, Care Teams, Patient Goals)    Allergies (verified) Niacin and related   Current Medications (verified) Outpatient Encounter Medications as of 05/28/2024  Medication Sig   ARIPiprazole  (ABILIFY ) 2 MG tablet TAKE 1 TABLET BY MOUTH EVERY DAY   buPROPion  ER (WELLBUTRIN  SR) 100 MG 12 hr tablet TAKE 1 TABLET BY MOUTH EVERY DAY IN THE MORNING   ciprofloxacin  (CIPRO ) 500 MG tablet Take 1 tablet (500 mg total) by mouth 2 (two) times daily for 7 days.   metroNIDAZOLE  (FLAGYL ) 500 MG tablet Take 1 tablet (500 mg total) by  mouth 2 (two) times daily for 7  days.   venlafaxine  XR (EFFEXOR -XR) 150 MG 24 hr capsule TAKE 1 CAPSULE BY MOUTH EVERY MORNING.   No facility-administered encounter medications on file as of 05/28/2024.    History: Past Medical History:  Diagnosis Date   Anxiety    Arthritis    Bowel trouble 2010   Depression, major, recurrent, moderate (HCC) 10/26/2014   GERD (gastroesophageal reflux disease)    Hyperlipidemia, mild 02/06/2015   TC 218 TG 66 HDL 60  LDL 146    Motion sickness    boats   Shoulder strain, right, initial encounter 09/13/2016   Special screening for malignant neoplasms, colon 2012   Past Surgical History:  Procedure Laterality Date   ANTERIOR AND POSTERIOR REPAIR N/A 09/24/2022   Procedure: ANTERIOR (CYSTOCELE) AND POSTERIOR REPAIR (RECTOCELE);  Surgeon: Janit Alm Agent, MD;  Location: [facility];  Service: Gynecology;  Laterality: N/A;   BLADDER SUSPENSION N/A 09/24/2022   Procedure: TOT;  Surgeon: Janit Alm Agent, MD;  Location: [facility];  Service: Gynecology;  Laterality: N/A;   CATARACT EXTRACTION W/PHACO Right 09/02/2017   Procedure: CATARACT EXTRACTION PHACO AND INTRAOCULAR LENS PLACEMENT (IOC) RIGHT SYMFONY TORIC;  Surgeon: Mittie Gaskin, MD;  Location: [facility];  Service: Ophthalmology;  Laterality: Right;   CATARACT EXTRACTION W/PHACO Left 10/30/2017   Procedure: CATARACT EXTRACTION PHACO AND INTRAOCULAR LENS PLACEMENT (IOC)  LEFT SYMFONY LENS;  Surgeon: Mittie Gaskin, MD;  Location: [facility];  Service: Ophthalmology;  Laterality: Left;   COLONOSCOPY W/ POLYPECTOMY  2012   tubular adenoma 0.5cm was removed from descending colon   COSMETIC SURGERY     EYE SURGERY     FACIAL COSMETIC SURGERY  2015   PARTIAL HYSTERECTOMY     bleeding; cervix and ovary remains   Family History  Problem Relation Age of Onset   COPD Mother    Social History   Occupational History   Not on file  Tobacco Use   Smoking status: Never    Smokeless tobacco: Never  Vaping Use   Vaping status: Never Used  Substance and Sexual Activity   Alcohol use: Never   Drug use: Never   Sexual activity: Not Currently    Birth control/protection: Surgical   Tobacco Counseling Counseling given: Not Answered  SDOH Screenings   Food Insecurity: No Food Insecurity (05/28/2024)  Housing: Low Risk (05/28/2024)  Transportation Needs: No Transportation Needs (05/28/2024)  Utilities: Not At Risk (05/28/2024)  Alcohol Screen: Low Risk (05/28/2024)  Depression (PHQ2-9): Low Risk (05/28/2024)  Financial Resource Strain: Low Risk (05/28/2024)  Physical Activity: Inactive (05/28/2024)  Social Connections: Socially Isolated (05/28/2024)  Stress: Stress Concern Present (05/28/2024)  Tobacco Use: Low Risk (05/28/2024)  Health Literacy: Adequate Health Literacy (05/28/2024)   See flowsheets for full screening details  Depression Screen PHQ 2 & 9 Depression Scale- Over the past 2 weeks, how often have you been bothered by any of the following problems? Little interest or pleasure in doing things: 0 Feeling down, depressed, or hopeless (PHQ Adolescent also includes...irritable): 1 PHQ-2 Total Score: 1 Trouble falling or staying asleep, or sleeping too much: 1 Feeling tired or having little energy: 1 Poor appetite or overeating (PHQ Adolescent also includes...weight loss): 0 Feeling bad about yourself - or that you are a failure or have let yourself or your family down: 0 Trouble concentrating on things, such as reading the newspaper or watching television (PHQ Adolescent also includes...like school work): 0 Moving or speaking so slowly that other people could have noticed. Or the  opposite - being so fidgety or restless that you have been moving around a lot more than usual: 0 Thoughts that you would be better off dead, or of hurting yourself in some way: 0 PHQ-9 Total Score: 2 If you checked off any problems, how difficult have these problems made it  for you to do your work, take care of things at home, or get along with other people?: Not difficult at all  Depression Treatment Depression Interventions/Treatment : Medication     Goals Addressed             This Visit's Progress    Exercise 3x per week (30 min per time)               Objective:    Today's Vitals   05/28/24 1353  BP: 121/79  Weight: 161 lb 3.2 oz (73.1 kg)  Height: 5' 4 (1.626 m)   Body mass index is 27.67 kg/m.  Hearing/Vision screen No results found. Immunizations and Health Maintenance Health Maintenance  Topic Date Due   DTaP/Tdap/Td (1 - Tdap) Never done   Zoster Vaccines- Shingrix (1 of 2) Never done   COVID-19 Vaccine (2 - Janssen risk series) 12/30/2020   Colonoscopy  05/08/2021   Bone Density Scan  Never done   Mammogram  07/05/2023   Medicare Annual Wellness (AWV)  08/15/2023   Influenza Vaccine  08/04/2024 (Originally 12/06/2023)   COLON CANCER SCREENING ANNUAL FOBT  06/06/2024   Pneumococcal Vaccine: 50+ Years  Completed   Hepatitis C Screening  Completed   Meningococcal B Vaccine  Aged Out        Assessment/Plan:  This is a routine wellness examination for Desiree Grant.  Patient Care Team: Justus Leita DEL, MD as PCP - General (Internal Medicine) Cathlyn Seal, MD (Dermatology)  I have personally reviewed and noted the following in the patients chart:   Medical and social history Use of alcohol, tobacco or illicit drugs  Current medications and supplements including opioid prescriptions. Functional ability and status Nutritional status Physical activity Advanced directives List of other physicians Hospitalizations, surgeries, and ER visits in previous 12 months Vitals Screenings to include cognitive, depression, and falls Referrals and appointments  No orders of the defined types were placed in this encounter.  In addition, I have reviewed and discussed with patient certain preventive protocols, quality  metrics, and best practice recommendations. A written personalized care plan for preventive services as well as general preventive health recommendations were provided to patient.   Desiree LITTIE Saris, LPN   8/77/7973    After Visit Summary: (In Person-Declined) Patient declined AVS at this time.  Nurse Notes: No voiced or noted concerns at this time Patient advised to keep follow-up appointment with PCP (06/2024) Appointment(s) made: (AWV?CPE Feb 2027) Vaccines not given: Shingles,Covid declined today Screenings not ordered: Declined Referral/Order for colonoscopy HM Addressed: Mammogram ordered DEXA ordered Cologuard Ordered  "

## 2024-05-28 NOTE — Patient Instructions (Addendum)
 Desiree Grant,  Thank you for taking the time for your Medicare Wellness Visit. I appreciate your continued commitment to your health goals. Please review the care plan we discussed, and feel free to reach out if I can assist you further.  Please note that Annual Wellness Visits do not include a physical exam. Some assessments may be limited, especially if the visit was conducted virtually. If needed, we may recommend an in-person follow-up with your provider.  Ongoing Care Seeing your primary care provider every 3 to 6 months helps us  monitor your health and provide consistent, personalized care.   Referrals If a referral was made during today's visit and you haven't received any updates within two weeks, please contact the referred provider directly to check on the status.  You have an order for:  []   2D Mammogram  [x]   3D Mammogram  [x]   Bone Density     Please call for appointment:  [facility]  [address] [place] [address] [phone]  [facility] [address] [place], [place] [address] [phone]  [facility] [address] [place], [place] [address] [phone]    Make sure to wear two-piece clothing.  No lotions, powders, or deodorants the day of the appointment. Make sure to bring picture ID and insurance card.  Bring list of medications you are currently taking including any supplements.    An order has been placed for a Cologuard for you. They will mail you the kit with instructions on how to obtain the sample and send it back in to be tested. If you do not received your kit, please call our office and let us  know.    Recommended Screenings:  Health Maintenance  Topic Date Due   DTaP/Tdap/Td vaccine (1 - Tdap) Never done   Zoster (Shingles) Vaccine (1 of 2) Never done   COVID-19 Vaccine (2 - Janssen risk series)  12/30/2020   Colon Cancer Screening  05/08/2021   Osteoporosis screening with Bone Density Scan  Never done   Breast Cancer Screening  07/05/2023   Medicare Annual Wellness Visit  08/15/2023   Flu Shot  08/04/2024*   Stool Blood Test  06/06/2024   Pneumococcal Vaccine for age over 62  Completed   Hepatitis C Screening  Completed   Meningitis B Vaccine  Aged Out  *Topic was postponed. The date shown is not the original due date.       05/27/2024    3:13 PM  Advanced Directives  Does Patient Have a Medical Advance Directive? Yes  Type of Estate Agent of [place];Living will  Does patient want to make changes to medical advance directive? No - Patient declined  Copy of Healthcare Power of Attorney in Chart? No - copy requested    Vision: Annual vision screenings are recommended for early detection of glaucoma, cataracts, and diabetic retinopathy. These exams can also reveal signs of chronic conditions such as diabetes and high blood pressure.  Dental: Annual dental screenings help detect early signs of oral cancer, gum disease, and other conditions linked to overall health, including heart disease and diabetes.  Please see the attached documents for additional preventive care recommendations.

## 2024-06-09 ENCOUNTER — Ambulatory Visit: Admitting: Family Medicine

## 2024-06-22 ENCOUNTER — Encounter: Payer: Self-pay | Admitting: Physician Assistant

## 2024-06-30 ENCOUNTER — Ambulatory Visit

## 2025-06-24 ENCOUNTER — Ambulatory Visit

## 2025-06-24 ENCOUNTER — Encounter: Admitting: Family Medicine
